# Patient Record
Sex: Female | Born: 1950
Health system: Southern US, Community
[De-identification: ages and names within clinical notes are randomized; demographics above are authoritative.]

## PROBLEM LIST (undated history)

## (undated) DIAGNOSIS — F419 Anxiety disorder, unspecified: Secondary | ICD-10-CM

## (undated) DIAGNOSIS — J189 Pneumonia, unspecified organism: Secondary | ICD-10-CM

## (undated) DIAGNOSIS — G4700 Insomnia, unspecified: Secondary | ICD-10-CM

## (undated) DIAGNOSIS — F32A Depression, unspecified: Secondary | ICD-10-CM

## (undated) DIAGNOSIS — I10 Essential (primary) hypertension: Secondary | ICD-10-CM

## (undated) DIAGNOSIS — Z9889 Other specified postprocedural states: Secondary | ICD-10-CM

## (undated) DIAGNOSIS — F329 Major depressive disorder, single episode, unspecified: Secondary | ICD-10-CM

## (undated) DIAGNOSIS — M549 Dorsalgia, unspecified: Secondary | ICD-10-CM

## (undated) DIAGNOSIS — K5792 Diverticulitis of intestine, part unspecified, without perforation or abscess without bleeding: Secondary | ICD-10-CM

## (undated) DIAGNOSIS — G43909 Migraine, unspecified, not intractable, without status migrainosus: Secondary | ICD-10-CM

## (undated) HISTORY — PX: ABDOMINAL HYSTERECTOMY: SHX81

## (undated) HISTORY — PX: TOE SURGERY: SHX1073

## (undated) HISTORY — PX: OTHER SURGICAL HISTORY: SHX169

## (undated) HISTORY — PX: BACK SURGERY: SHX140

## (undated) HISTORY — PX: SINUS ENDO WITH FUSION: SHX5329

---

## 1998-12-03 ENCOUNTER — Emergency Department (HOSPITAL_COMMUNITY): Admission: EM | Admit: 1998-12-03 | Discharge: 1998-12-03 | Payer: Self-pay | Admitting: *Deleted

## 1998-12-03 ENCOUNTER — Encounter: Payer: Self-pay | Admitting: *Deleted

## 2001-03-07 ENCOUNTER — Encounter: Payer: Self-pay | Admitting: Unknown Physician Specialty

## 2001-03-07 ENCOUNTER — Ambulatory Visit (HOSPITAL_COMMUNITY): Admission: RE | Admit: 2001-03-07 | Discharge: 2001-03-07 | Payer: Self-pay | Admitting: Unknown Physician Specialty

## 2001-03-31 ENCOUNTER — Ambulatory Visit (HOSPITAL_COMMUNITY): Admission: RE | Admit: 2001-03-31 | Discharge: 2001-03-31 | Payer: Self-pay | Admitting: Unknown Physician Specialty

## 2001-03-31 ENCOUNTER — Encounter: Payer: Self-pay | Admitting: Unknown Physician Specialty

## 2004-08-03 ENCOUNTER — Emergency Department (HOSPITAL_COMMUNITY): Admission: EM | Admit: 2004-08-03 | Discharge: 2004-08-03 | Payer: Self-pay | Admitting: Emergency Medicine

## 2004-11-07 ENCOUNTER — Emergency Department (HOSPITAL_COMMUNITY): Admission: EM | Admit: 2004-11-07 | Discharge: 2004-11-07 | Payer: Self-pay | Admitting: Emergency Medicine

## 2005-12-21 ENCOUNTER — Emergency Department (HOSPITAL_COMMUNITY): Admission: EM | Admit: 2005-12-21 | Discharge: 2005-12-21 | Payer: Self-pay | Admitting: Emergency Medicine

## 2006-07-12 ENCOUNTER — Ambulatory Visit (HOSPITAL_COMMUNITY): Admission: RE | Admit: 2006-07-12 | Discharge: 2006-07-12 | Payer: Self-pay | Admitting: Family Medicine

## 2006-09-17 ENCOUNTER — Emergency Department (HOSPITAL_COMMUNITY): Admission: EM | Admit: 2006-09-17 | Discharge: 2006-09-17 | Payer: Self-pay | Admitting: Emergency Medicine

## 2006-09-19 ENCOUNTER — Ambulatory Visit: Payer: Self-pay | Admitting: Orthopedic Surgery

## 2006-09-29 ENCOUNTER — Ambulatory Visit: Payer: Self-pay | Admitting: Orthopedic Surgery

## 2006-11-03 ENCOUNTER — Ambulatory Visit: Payer: Self-pay | Admitting: Orthopedic Surgery

## 2006-12-19 ENCOUNTER — Ambulatory Visit: Payer: Self-pay | Admitting: Orthopedic Surgery

## 2007-09-15 ENCOUNTER — Ambulatory Visit (HOSPITAL_COMMUNITY): Admission: RE | Admit: 2007-09-15 | Discharge: 2007-09-15 | Payer: Self-pay | Admitting: Family Medicine

## 2009-12-01 ENCOUNTER — Emergency Department (HOSPITAL_COMMUNITY)
Admission: EM | Admit: 2009-12-01 | Discharge: 2009-12-01 | Payer: Self-pay | Source: Home / Self Care | Admitting: Emergency Medicine

## 2010-06-12 ENCOUNTER — Ambulatory Visit (HOSPITAL_COMMUNITY)
Admission: RE | Admit: 2010-06-12 | Discharge: 2010-06-12 | Payer: Self-pay | Source: Home / Self Care | Attending: Family Medicine | Admitting: Family Medicine

## 2010-06-14 ENCOUNTER — Encounter: Payer: Self-pay | Admitting: Family Medicine

## 2010-08-09 LAB — URINALYSIS, ROUTINE W REFLEX MICROSCOPIC: Nitrite: NEGATIVE

## 2010-08-09 LAB — URINE MICROSCOPIC-ADD ON

## 2010-10-23 ENCOUNTER — Emergency Department (HOSPITAL_COMMUNITY)
Admission: EM | Admit: 2010-10-23 | Discharge: 2010-10-23 | Disposition: A | Discharge status: Home / Self Care | Payer: Self-pay | Attending: Emergency Medicine | Admitting: Emergency Medicine

## 2010-10-23 DIAGNOSIS — G43909 Migraine, unspecified, not intractable, without status migrainosus: Secondary | ICD-10-CM | POA: Insufficient documentation

## 2010-10-23 DIAGNOSIS — F17200 Nicotine dependence, unspecified, uncomplicated: Secondary | ICD-10-CM | POA: Insufficient documentation

## 2011-04-14 ENCOUNTER — Other Ambulatory Visit: Payer: Self-pay

## 2011-04-14 ENCOUNTER — Encounter: Payer: Self-pay | Admitting: Emergency Medicine

## 2011-04-14 ENCOUNTER — Emergency Department (HOSPITAL_COMMUNITY)
Admission: EM | Admit: 2011-04-14 | Discharge: 2011-04-14 | Disposition: A | Discharge status: Home / Self Care | Payer: Self-pay | Attending: Emergency Medicine | Admitting: Emergency Medicine

## 2011-04-14 DIAGNOSIS — N390 Urinary tract infection, site not specified: Secondary | ICD-10-CM | POA: Insufficient documentation

## 2011-04-14 DIAGNOSIS — R209 Unspecified disturbances of skin sensation: Secondary | ICD-10-CM | POA: Insufficient documentation

## 2011-04-14 DIAGNOSIS — M79609 Pain in unspecified limb: Secondary | ICD-10-CM | POA: Insufficient documentation

## 2011-04-14 DIAGNOSIS — R0602 Shortness of breath: Secondary | ICD-10-CM | POA: Insufficient documentation

## 2011-04-14 DIAGNOSIS — R5383 Other fatigue: Secondary | ICD-10-CM | POA: Insufficient documentation

## 2011-04-14 DIAGNOSIS — H5702 Anisocoria: Secondary | ICD-10-CM | POA: Insufficient documentation

## 2011-04-14 DIAGNOSIS — M549 Dorsalgia, unspecified: Secondary | ICD-10-CM | POA: Insufficient documentation

## 2011-04-14 DIAGNOSIS — R5381 Other malaise: Secondary | ICD-10-CM | POA: Insufficient documentation

## 2011-04-14 DIAGNOSIS — R197 Diarrhea, unspecified: Secondary | ICD-10-CM | POA: Insufficient documentation

## 2011-04-14 DIAGNOSIS — R112 Nausea with vomiting, unspecified: Secondary | ICD-10-CM | POA: Insufficient documentation

## 2011-04-14 DIAGNOSIS — M25519 Pain in unspecified shoulder: Secondary | ICD-10-CM | POA: Insufficient documentation

## 2011-04-14 HISTORY — DX: Migraine, unspecified, not intractable, without status migrainosus: G43.909

## 2011-04-14 HISTORY — DX: Dorsalgia, unspecified: M54.9

## 2011-04-14 HISTORY — DX: Depression, unspecified: F32.A

## 2011-04-14 HISTORY — DX: Insomnia, unspecified: G47.00

## 2011-04-14 HISTORY — DX: Diverticulitis of intestine, part unspecified, without perforation or abscess without bleeding: K57.92

## 2011-04-14 HISTORY — DX: Anxiety disorder, unspecified: F41.9

## 2011-04-14 HISTORY — DX: Major depressive disorder, single episode, unspecified: F32.9

## 2011-04-14 LAB — URINALYSIS, ROUTINE W REFLEX MICROSCOPIC
Bilirubin Urine: NEGATIVE
Glucose, UA: NEGATIVE mg/dL
Ketones, ur: NEGATIVE mg/dL
Nitrite: NEGATIVE
Protein, ur: NEGATIVE mg/dL
Specific Gravity, Urine: 1.015 (ref 1.005–1.030)
Urobilinogen, UA: 0.2 mg/dL (ref 0.0–1.0)
pH: 5.5 (ref 5.0–8.0)

## 2011-04-14 LAB — POCT I-STAT, CHEM 8
BUN: 13 mg/dL (ref 6–23)
Calcium, Ion: 1.17 mmol/L (ref 1.12–1.32)
Chloride: 109 mEq/L (ref 96–112)
Creatinine, Ser: 0.8 mg/dL (ref 0.50–1.10)
Glucose, Bld: 96 mg/dL (ref 70–99)
HCT: 41 % (ref 36.0–46.0)
Hemoglobin: 13.9 g/dL (ref 12.0–15.0)
Potassium: 4 mEq/L (ref 3.5–5.1)
Sodium: 140 mEq/L (ref 135–145)
TCO2: 20 mmol/L (ref 0–100)

## 2011-04-14 LAB — URINE MICROSCOPIC-ADD ON

## 2011-04-14 MED ORDER — DIAZEPAM 5 MG/ML IJ SOLN
5.0000 mg | Freq: Once | INTRAMUSCULAR | Status: AC
  Administered 2011-04-14: 5 mg via INTRAVENOUS
  Filled 2011-04-14: qty 2

## 2011-04-14 MED ORDER — DEXTROSE 5 % IV SOLN
1.0000 g | INTRAVENOUS | Status: DC
  Administered 2011-04-14: 1 g via INTRAVENOUS
  Filled 2011-04-14: qty 10

## 2011-04-14 MED ORDER — HYDROMORPHONE HCL PF 1 MG/ML IJ SOLN
1.0000 mg | Freq: Once | INTRAMUSCULAR | Status: AC
  Administered 2011-04-14: 1 mg via INTRAVENOUS
  Filled 2011-04-14: qty 1

## 2011-04-14 MED ORDER — NITROFURANTOIN MONOHYD MACRO 100 MG PO CAPS: 100.0000 mg | ORAL_CAPSULE | Freq: Two times a day (BID) | ORAL | Status: AC

## 2011-04-14 MED ORDER — SODIUM CHLORIDE 0.9 % IV BOLUS (SEPSIS)
500.0000 mL | Freq: Once | INTRAVENOUS | Status: AC
  Administered 2011-04-14: 500 mL via INTRAVENOUS

## 2011-04-14 MED ORDER — ONDANSETRON HCL 4 MG/2ML IJ SOLN
4.0000 mg | Freq: Once | INTRAMUSCULAR | Status: AC
  Administered 2011-04-14: 4 mg via INTRAVENOUS
  Filled 2011-04-14: qty 2

## 2011-04-14 NOTE — ED Provider Notes (Addendum)
History  Scribed for [name], MD, the patient was seen in room APA09. This chart was scribed by Hillery Hunter.   CSN: [id number] Arrival date & time: 04/14/2011  3:04 PM   First MD Initiated Contact with Patient 04/14/11 1506      Chief Complaint  Patient presents with  . Shoulder Pain  . Back Pain  . Leg Pain  . Hematuria  . Dysuria    The history is provided by the patient.    Wendy Arnold is a 60 y.o. female who presents to the Emergency Department complaining of four months of constant "throbbing" muskuloskeletal pain in hips, legs, shoulders and lower back. She describes this pain as gradually worsening over that time and improved transiently with warm and cold compresses, Vicodin, Ibuprofen and Tylenol. She complains also of hematuria, fever, nausea, vomiting, mild diarrhea occasionally, shortness of breath two weeks ago, but states her primary complaint is "pain all over." She reports previous back surgery in 1996.      Past Medical History  Diagnosis Date  . Anxiety   . Insomnia   . Depression   . Back pain   . Migraines   . Diverticulitis     Past Surgical History  Procedure Date  . Back surgery   . Sinus endo with fusion   . Abdominal hysterectomy   . Toe surgery     Family History  Problem Relation Age of Onset  . Heart failure Mother   . Cancer Father   . Cancer Brother   . Cancer Brother   . Cancer Brother     History  Substance Use Topics  . Smoking status: Current Everyday Smoker -- 0.5 packs/day for 40 years    Types: Cigarettes  . Smokeless tobacco: Never Used  . Alcohol Use: Yes     rarely  confirms occasional alcohol use  OB History    Grav Para Term Preterm Abortions TAB SAB Ect Mult Living   5 1  1 4     1       Review of Systems  Constitutional: Positive for fever.  Respiratory: Positive for shortness of breath.   Gastrointestinal: Positive for nausea, vomiting and diarrhea.  Genitourinary: Positive for  dysuria and hematuria (one week ago, improved, then a few clots in urine recently).  Musculoskeletal: Positive for back pain.  Skin: Negative for wound.  Neurological:       Left arm tingling intermittently  Psychiatric/Behavioral: Negative for confusion.    Allergies  Review of patient's allergies indicates no known allergies.  Home Medications   Current Outpatient Rx  Name Route Sig Dispense Refill  . ACETAMINOPHEN 325 MG PO TABS Oral Take 1,300 mg by mouth every 6 (six) hours as needed. For pain. **Alternating with Ibuprofen 200-800mg  every 2 to 3 hours as needed for pain**     . ALPRAZOLAM 1 MG PO TABS Oral Take 1 mg by mouth 4 (four) times daily.      . IBUPROFEN 200 MG PO TABS Oral Take 200-400 mg by mouth every 3 (three) hours as needed. For pain. **Alternating with Tylenol 325 up to every 2 to 3 hours as needed for pain**     . BIOFREEZE EX Apply externally Apply 1 application topically as needed. For pain     . TRAZODONE HCL 150 MG PO TABS Oral Take 150 mg by mouth at bedtime.        BP 146/82  Pulse 86  Temp(Src) 98.4 F (36.9 C) (  Oral)  Resp 20  Ht 5\' 7"  (1.702 m)  Wt 190 lb (86.183 kg)  BMI 29.76 kg/m2  SpO2 98%  Physical Exam  Nursing note and vitals reviewed. Constitutional: She is oriented to person, place, and time. She appears well-nourished. No distress.  HENT:  Head: Normocephalic and atraumatic.  Eyes: Conjunctivae are normal.       Anisocoria R (4mm) > L (2-83mm), both reactive to light  Neck: Neck supple.  Cardiovascular: Normal rate, regular rhythm and normal heart sounds.   Pulmonary/Chest: Effort normal. No stridor. No respiratory distress. She has no wheezes. She has no rales.  Abdominal: Soft. She exhibits no distension. There is no tenderness. There is no rebound and no guarding.       No CVA ttp  Musculoskeletal: She exhibits no edema and no tenderness.  Neurological: She is alert and oriented to person, place, and time.  Skin: Skin is warm  and dry. No rash noted.  Psychiatric:       Histrionic affect, anxious appearing    ED Course  Procedures    Labs Reviewed  I-STAT, CHEM 8  URINALYSIS, ROUTINE W REFLEX MICROSCOPIC   No results found.   OTHER DATA REVIEWED: Nursing notes, vital signs, and past medical records reviewed.   DIAGNOSTIC STUDIES: Oxygen Saturation is 98% on room air, normal by my interpretation.     ED COURSE / COORDINATION OF CARE: 15:35. Ordered: I-Stat, Chem 8 ; Urinalysis with microscopic ; sodium chloride 0.9 % bolus 500 mL ; HYDROmorphone (DILAUDID) injection 1 mg ; diazepam (VALIUM) injection 5 mg ; ondansetron (ZOFRAN) injection 4 mg ; ED EKG    4:08 PM 60yf with multiple complaints. Pt very anxious and histrionic and difficult to obtain focused hx and differentiate chronic from acute complaints. Diffuse pain of unclear etiology. No hx of acute trauma. S/p traumatic injuries in past and suspect some component of PTSD. Pain in back chronic in nature. No neuro complaints. No focal tenderness or suspicious skin lesions. Feel imaging very low yield at this time and unlikely to find etiology.  Nonfocal neuro exam aside from anisicoria. Possible UTI with hematuria and dysuria. Will check urine and basic labs to asses renal function. Symptoms somewhat atypical for renal colic with constant nature and duration for fever months. IVF, meds and reassessment.  EKG:  Rhythm: normal sinus Rate: 89 Axis: normal Intervals:normal ST segments: NS ST changes. Some flattening  anteriorly and aVL   MDM  60yf with multiple complaints. Given UA and symptoms will tx for UTI. Clinically not pyelo. Afebrile and HD stable. Renal function fine. Pt anxious but not toxic. Feel that can appropriately follow-up as outpt. Tylenol/nsaids prn pain.      I personally preformed the services scribed in my presence. The recorded information has been reviewed and considered. [name], MD.  5:22 PM Called to speak  with pt again on request of nursing. Pt requesting prescription for pain medication "to get me through until I can see someone." Discussed with pt that a lot of her pain complaints are chronic in nature and that emergency room is not the appropriate place for management of chronic pain. Pt has seen pain management in past so encouraged to follow-up with them. Pt encouraged to be advocate for self and need to be more proactive in seeking outpt follow-up. Pt received pain meds in ED. Has vicodin at home. Given script for abx for uti. Encouraged to take nsaids prn.   [name], MD 04/14/11  1650  [name], MD 04/14/11 [phone]

## 2011-04-14 NOTE — ED Notes (Signed)
Patient c/o pain in shoulders, back, hip, and legs. Patient also c/o blood in urine and pain with urine.

## 2011-04-14 NOTE — ED Notes (Signed)
Left in c/o family for transport home; pt instructed to fill abx Rx and take until completed.

## 2011-04-14 NOTE — ED Notes (Signed)
C/o pain from "bottom of my feet up to my neck" since July of this year. Reports pain is throbbing.

## 2011-04-14 NOTE — ED Notes (Signed)
Resting in no distress; reports bilateral lower extremity pain 2/10; denies relief of pain in hips, back or neck-states 9-10/10.

## 2011-04-14 NOTE — Discharge Instructions (Signed)
Urinary Tract Infection Infections of the urinary tract can start in several places. A bladder infection (cystitis), a kidney infection (pyelonephritis), and a prostate infection (prostatitis) are different types of urinary tract infections (UTIs). They usually get better if treated with medicines (antibiotics) that kill germs. Take all the medicine until it is gone. You or your child may feel better in a few days, but TAKE ALL MEDICINE or the infection may not respond and may become more difficult to treat. HOME CARE INSTRUCTIONS   Drink enough water and fluids to keep the urine clear or pale yellow. Cranberry juice is especially recommended, in addition to large amounts of water.   Avoid caffeine, tea, and carbonated beverages. They tend to irritate the bladder.   Alcohol may irritate the prostate.   Only take over-the-counter or prescription medicines for pain, discomfort, or fever as directed by your caregiver.  To prevent further infections:  Empty the bladder often. Avoid holding urine for long periods of time.   After a bowel movement, women should cleanse from front to back. Use each tissue only once.   Empty the bladder before and after sexual intercourse.  FINDING OUT THE RESULTS OF YOUR TEST Not all test results are available during your visit. If your or your child's test results are not back during the visit, make an appointment with your caregiver to find out the results. Do not assume everything is normal if you have not heard from your caregiver or the medical facility. It is important for you to follow up on all test results. SEEK MEDICAL CARE IF:   There is back pain.   Your baby is older than 3 months with a rectal temperature of 100.5 F (38.1 C) or higher for more than 1 day.   Your or your child's problems (symptoms) are no better in 3 days. Return sooner if you or your child is getting worse.  SEEK IMMEDIATE MEDICAL CARE IF:   There is severe back pain or lower  abdominal pain.   You or your child develops chills.   You have a fever.   Your baby is older than 3 months with a rectal temperature of 102 F (38.9 C) or higher.   Your baby is 71 months old or younger with a rectal temperature of 100.4 F (38 C) or higher.   There is nausea or vomiting.   There is continued burning or discomfort with urination.  MAKE SURE YOU:   Understand these instructions.   Will watch your condition.   Will get help right away if you are not doing well or get worse.  Document Released: 02/17/2005 Document Revised: 01/20/2011 Document Reviewed: 09/22/2006 [facility] Patient Information 2012 [place], [place].Weakness, Generalized Without Cause Your caregiver has seen you today because you are having problems with feelings of weakness. Weakness has many different causes, some of which are common and others are very rare. The causes of weakness are so numerous they could not all be listed on this page. The exam and other tests done today do not reveal a specific cause for the weakness that is an immediate danger or something that is treatable. Your caregiver has checked you for the most common causes of weakness and feels it is safe for you to go home and be observed. HOME CARE INSTRUCTIONS   For the time being, obtain more rest if needed.   Eat a well balanced diet.   Try to get at least some exercise every day in spite of how  difficult it may seem at times. In the case of the elderly, exercise is especially important. As we grow older, there is a loss of muscle mass. Generally, there is also a loss of, or decrease in, activity that comes naturally with the aging process. Exercise and increased activities are the only tools we have to combat this natural process.   The results of some tests ordered today may not be available right away. You will be contacted with those results when they become available.   It is important to follow through with your physician as  per instructions that you may have received today.  SEEK MEDICAL CARE IF:   You have any new concerns which you do not feel were dealt with today.   The weakness seems to be getting progressively worse.   You develop new or unusual aches or pains.  SEEK IMMEDIATE MEDICAL CARE IF:   You are unable to tend to your usual daily activities such as simply getting dressed, feeding yourself, or keeping up with your personal hygiene.   You develop inability to walk stairs or perform your usual daily activities.   You develop shortness of breath, chest pain, have difficulty moving parts of your body, or develop new problems for which you have not talked to your caregiver.   You experience difficulty speaking or swallowing.   You develop loss of control of bladder or bowels that was not present before.  Document Released: 05/10/2005 Document Revised: 01/20/2011 Document Reviewed: 10/20/2006 [facility] Patient Information 2012 [place], [place].

## 2011-07-02 ENCOUNTER — Ambulatory Visit (HOSPITAL_COMMUNITY)
Admission: RE | Admit: 2011-07-02 | Discharge: 2011-07-02 | Disposition: A | Discharge status: Home / Self Care | Payer: Self-pay | Source: Ambulatory Visit | Attending: Family Medicine | Admitting: Family Medicine

## 2011-07-02 ENCOUNTER — Other Ambulatory Visit (HOSPITAL_COMMUNITY): Payer: Self-pay | Admitting: Family Medicine

## 2011-07-02 DIAGNOSIS — F17200 Nicotine dependence, unspecified, uncomplicated: Secondary | ICD-10-CM

## 2011-07-02 DIAGNOSIS — R059 Cough, unspecified: Secondary | ICD-10-CM

## 2011-07-02 DIAGNOSIS — M5137 Other intervertebral disc degeneration, lumbosacral region: Secondary | ICD-10-CM | POA: Insufficient documentation

## 2011-07-02 DIAGNOSIS — M549 Dorsalgia, unspecified: Secondary | ICD-10-CM

## 2011-07-02 DIAGNOSIS — M542 Cervicalgia: Secondary | ICD-10-CM | POA: Insufficient documentation

## 2011-07-02 DIAGNOSIS — Z139 Encounter for screening, unspecified: Secondary | ICD-10-CM

## 2011-07-02 DIAGNOSIS — M51379 Other intervertebral disc degeneration, lumbosacral region without mention of lumbar back pain or lower extremity pain: Secondary | ICD-10-CM | POA: Insufficient documentation

## 2011-07-02 DIAGNOSIS — R05 Cough: Secondary | ICD-10-CM

## 2011-07-02 DIAGNOSIS — M545 Low back pain, unspecified: Secondary | ICD-10-CM | POA: Insufficient documentation

## 2011-07-02 DIAGNOSIS — M546 Pain in thoracic spine: Secondary | ICD-10-CM | POA: Insufficient documentation

## 2011-07-02 DIAGNOSIS — M5380 Other specified dorsopathies, site unspecified: Secondary | ICD-10-CM | POA: Insufficient documentation

## 2011-07-08 ENCOUNTER — Ambulatory Visit (HOSPITAL_COMMUNITY)
Admission: RE | Admit: 2011-07-08 | Discharge: 2011-07-08 | Disposition: A | Discharge status: Home / Self Care | Payer: Self-pay | Source: Ambulatory Visit | Attending: Family Medicine | Admitting: Family Medicine

## 2011-07-08 DIAGNOSIS — Z139 Encounter for screening, unspecified: Secondary | ICD-10-CM

## 2011-07-08 DIAGNOSIS — Z13820 Encounter for screening for osteoporosis: Secondary | ICD-10-CM | POA: Insufficient documentation

## 2011-07-08 DIAGNOSIS — Z780 Asymptomatic menopausal state: Secondary | ICD-10-CM | POA: Insufficient documentation

## 2011-07-23 ENCOUNTER — Emergency Department (HOSPITAL_COMMUNITY): Payer: Self-pay

## 2011-07-23 ENCOUNTER — Inpatient Hospital Stay (HOSPITAL_COMMUNITY)
Admission: EM | Admit: 2011-07-23 | Discharge: 2011-07-27 | DRG: 690 | Disposition: A | Discharge status: Home / Self Care | Payer: Self-pay | Attending: Internal Medicine | Admitting: Internal Medicine

## 2011-07-23 ENCOUNTER — Encounter (HOSPITAL_COMMUNITY): Payer: Self-pay | Admitting: *Deleted

## 2011-07-23 DIAGNOSIS — J438 Other emphysema: Secondary | ICD-10-CM | POA: Diagnosis present

## 2011-07-23 DIAGNOSIS — N10 Acute tubulo-interstitial nephritis: Principal | ICD-10-CM | POA: Diagnosis present

## 2011-07-23 DIAGNOSIS — D649 Anemia, unspecified: Secondary | ICD-10-CM | POA: Diagnosis present

## 2011-07-23 DIAGNOSIS — G43909 Migraine, unspecified, not intractable, without status migrainosus: Secondary | ICD-10-CM | POA: Diagnosis present

## 2011-07-23 DIAGNOSIS — G8929 Other chronic pain: Secondary | ICD-10-CM | POA: Diagnosis present

## 2011-07-23 DIAGNOSIS — Z148 Genetic carrier of other disease: Secondary | ICD-10-CM

## 2011-07-23 DIAGNOSIS — E860 Dehydration: Secondary | ICD-10-CM | POA: Diagnosis present

## 2011-07-23 DIAGNOSIS — R042 Hemoptysis: Secondary | ICD-10-CM | POA: Diagnosis present

## 2011-07-23 DIAGNOSIS — Z79899 Other long term (current) drug therapy: Secondary | ICD-10-CM

## 2011-07-23 DIAGNOSIS — F411 Generalized anxiety disorder: Secondary | ICD-10-CM | POA: Diagnosis present

## 2011-07-23 DIAGNOSIS — F3289 Other specified depressive episodes: Secondary | ICD-10-CM | POA: Diagnosis present

## 2011-07-23 DIAGNOSIS — N12 Tubulo-interstitial nephritis, not specified as acute or chronic: Secondary | ICD-10-CM

## 2011-07-23 DIAGNOSIS — G4700 Insomnia, unspecified: Secondary | ICD-10-CM | POA: Diagnosis present

## 2011-07-23 DIAGNOSIS — Z7982 Long term (current) use of aspirin: Secondary | ICD-10-CM

## 2011-07-23 DIAGNOSIS — F17200 Nicotine dependence, unspecified, uncomplicated: Secondary | ICD-10-CM | POA: Diagnosis present

## 2011-07-23 DIAGNOSIS — R112 Nausea with vomiting, unspecified: Secondary | ICD-10-CM | POA: Diagnosis present

## 2011-07-23 DIAGNOSIS — K5730 Diverticulosis of large intestine without perforation or abscess without bleeding: Secondary | ICD-10-CM | POA: Diagnosis present

## 2011-07-23 DIAGNOSIS — Z224 Carrier of infections with a predominantly sexual mode of transmission: Secondary | ICD-10-CM

## 2011-07-23 DIAGNOSIS — F329 Major depressive disorder, single episode, unspecified: Secondary | ICD-10-CM | POA: Diagnosis present

## 2011-07-23 DIAGNOSIS — N390 Urinary tract infection, site not specified: Secondary | ICD-10-CM | POA: Diagnosis present

## 2011-07-23 DIAGNOSIS — J189 Pneumonia, unspecified organism: Secondary | ICD-10-CM | POA: Diagnosis present

## 2011-07-23 LAB — URINE MICROSCOPIC-ADD ON

## 2011-07-23 LAB — URINALYSIS, ROUTINE W REFLEX MICROSCOPIC
Glucose, UA: NEGATIVE mg/dL
Ketones, ur: NEGATIVE mg/dL
pH: 6 (ref 5.0–8.0)

## 2011-07-23 MED ORDER — ACETAMINOPHEN 500 MG PO TABS
ORAL_TABLET | ORAL | Status: AC
  Filled 2011-07-23: qty 2

## 2011-07-23 MED ORDER — SODIUM CHLORIDE 0.9 % IV BOLUS (SEPSIS)
1000.0000 mL | Freq: Once | INTRAVENOUS | Status: AC
  Administered 2011-07-23: 1000 mL via INTRAVENOUS

## 2011-07-23 MED ORDER — ACETAMINOPHEN 500 MG PO TABS
1000.0000 mg | ORAL_TABLET | Freq: Once | ORAL | Status: AC
  Administered 2011-07-23: 1000 mg via ORAL

## 2011-07-23 MED ORDER — ONDANSETRON HCL 4 MG/2ML IJ SOLN
4.0000 mg | Freq: Once | INTRAMUSCULAR | Status: AC
  Administered 2011-07-23: 4 mg via INTRAVENOUS
  Filled 2011-07-23: qty 2

## 2011-07-23 MED ORDER — SODIUM CHLORIDE 0.9 % IV SOLN
INTRAVENOUS | Status: DC
  Administered 2011-07-24 (×2): via INTRAVENOUS

## 2011-07-23 NOTE — ED Notes (Signed)
Pt tearful, seems to be agitated

## 2011-07-23 NOTE — ED Notes (Signed)
Pt c/o n/v x 2 months. States has been seen at multiple places with no relief. Pt blew nose and there was blood on the tissue.

## 2011-07-23 NOTE — ED Notes (Signed)
No needs voiced at this tme.

## 2011-07-24 ENCOUNTER — Inpatient Hospital Stay (HOSPITAL_COMMUNITY): Payer: Self-pay

## 2011-07-24 DIAGNOSIS — N390 Urinary tract infection, site not specified: Secondary | ICD-10-CM | POA: Diagnosis present

## 2011-07-24 DIAGNOSIS — J189 Pneumonia, unspecified organism: Secondary | ICD-10-CM | POA: Diagnosis present

## 2011-07-24 DIAGNOSIS — R042 Hemoptysis: Secondary | ICD-10-CM | POA: Diagnosis present

## 2011-07-24 DIAGNOSIS — Z148 Genetic carrier of other disease: Secondary | ICD-10-CM

## 2011-07-24 LAB — EXPECTORATED SPUTUM ASSESSMENT W GRAM STAIN, RFLX TO RESP C

## 2011-07-24 LAB — TSH: TSH: 2.299 u[IU]/mL (ref 0.350–4.500)

## 2011-07-24 LAB — CBC
HCT: 30.9 % — ABNORMAL LOW (ref 36.0–46.0)
Hemoglobin: 10.2 g/dL — ABNORMAL LOW (ref 12.0–15.0)
MCH: 31.6 pg (ref 26.0–34.0)
MCHC: 33 g/dL (ref 30.0–36.0)
MCHC: 33.6 g/dL (ref 30.0–36.0)
MCV: 95.7 fL (ref 78.0–100.0)
Platelets: 338 K/uL (ref 150–400)
Platelets: 347 10*3/uL (ref 150–400)
RBC: 3.23 MIL/uL — ABNORMAL LOW (ref 3.87–5.11)
RDW: 12.8 % (ref 11.5–15.5)
RDW: 12.8 % (ref 11.5–15.5)
WBC: 11.3 K/uL — ABNORMAL HIGH (ref 4.0–10.5)
WBC: 12.9 10*3/uL — ABNORMAL HIGH (ref 4.0–10.5)

## 2011-07-24 LAB — MRSA PCR SCREENING: MRSA by PCR: NEGATIVE

## 2011-07-24 LAB — COMPREHENSIVE METABOLIC PANEL
ALT: 28 U/L (ref 0–35)
AST: 21 U/L (ref 0–37)
Calcium: 8.5 mg/dL (ref 8.4–10.5)
Sodium: 138 mEq/L (ref 135–145)
Total Protein: 6.5 g/dL (ref 6.0–8.3)

## 2011-07-24 LAB — PRO B NATRIURETIC PEPTIDE: Pro B Natriuretic peptide (BNP): 876.5 pg/mL — ABNORMAL HIGH (ref 0–125)

## 2011-07-24 LAB — DIFFERENTIAL
Basophils Absolute: 0 10*3/uL (ref 0.0–0.1)
Basophils Relative: 0 % (ref 0–1)
Neutro Abs: 9.2 10*3/uL — ABNORMAL HIGH (ref 1.7–7.7)
Neutrophils Relative %: 72 % (ref 43–77)

## 2011-07-24 LAB — BASIC METABOLIC PANEL
Chloride: 101 mEq/L (ref 96–112)
Creatinine, Ser: 0.77 mg/dL (ref 0.50–1.10)
GFR calc Af Amer: >90 mL/min (ref >90)
Potassium: 4.1 mEq/L (ref 3.5–5.1)
Sodium: 135 mEq/L (ref 135–145)

## 2011-07-24 LAB — VITAMIN B12: Vitamin B-12: 447 pg/mL (ref 211–911)

## 2011-07-24 LAB — PHOSPHORUS: Phosphorus: 4.3 mg/dL (ref 2.3–4.6)

## 2011-07-24 LAB — PROTIME-INR: INR: 1.18 (ref 0.00–1.49)

## 2011-07-24 LAB — HEPATIC FUNCTION PANEL
Albumin: 3 g/dL — ABNORMAL LOW (ref 3.5–5.2)
Total Bilirubin: 0.3 mg/dL (ref 0.3–1.2)
Total Protein: 6.9 g/dL (ref 6.0–8.3)

## 2011-07-24 LAB — APTT: aPTT: 38 seconds — ABNORMAL HIGH (ref 24–37)

## 2011-07-24 LAB — PROCALCITONIN: Procalcitonin: <0.1 ng/mL

## 2011-07-24 LAB — RHEUMATOID FACTOR: Rhuematoid fact SerPl-aCnc: <10 IU/mL (ref <=14)

## 2011-07-24 LAB — MAGNESIUM: Magnesium: 1.8 mg/dL (ref 1.5–2.5)

## 2011-07-24 LAB — LACTIC ACID, PLASMA: Lactic Acid, Venous: 1.1 mmol/L (ref 0.5–2.2)

## 2011-07-24 MED ORDER — PIPERACILLIN-TAZOBACTAM 3.375 G IVPB
INTRAVENOUS | Status: AC
  Filled 2011-07-24: qty 50

## 2011-07-24 MED ORDER — HYDROCODONE-ACETAMINOPHEN 5-325 MG PO TABS
1.0000 | ORAL_TABLET | Freq: Four times a day (QID) | ORAL | Status: DC | PRN
  Administered 2011-07-24 – 2011-07-27 (×13): 1 via ORAL
  Filled 2011-07-24 (×14): qty 1

## 2011-07-24 MED ORDER — ALBUTEROL SULFATE (5 MG/ML) 0.5% IN NEBU
2.5000 mg | INHALATION_SOLUTION | Freq: Once | RESPIRATORY_TRACT | Status: AC
  Administered 2011-07-24: 2.5 mg via RESPIRATORY_TRACT
  Filled 2011-07-24: qty 0.5

## 2011-07-24 MED ORDER — VANCOMYCIN HCL IN DEXTROSE 1-5 GM/200ML-% IV SOLN
1000.0000 mg | Freq: Once | INTRAVENOUS | Status: AC
  Administered 2011-07-24: 1000 mg via INTRAVENOUS
  Filled 2011-07-24: qty 200

## 2011-07-24 MED ORDER — TUBERCULIN PPD 5 UNIT/0.1ML ID SOLN
5.0000 [IU] | Freq: Once | INTRADERMAL | Status: AC
  Administered 2011-07-24: 5 [IU] via INTRADERMAL
  Filled 2011-07-24: qty 0.1

## 2011-07-24 MED ORDER — DEXTROSE 5 % IV SOLN
1.0000 g | Freq: Once | INTRAVENOUS | Status: AC
  Administered 2011-07-24: 1 g via INTRAVENOUS
  Filled 2011-07-24: qty 10

## 2011-07-24 MED ORDER — VANCOMYCIN HCL IN DEXTROSE 1-5 GM/200ML-% IV SOLN
1000.0000 mg | Freq: Two times a day (BID) | INTRAVENOUS | Status: DC
  Administered 2011-07-24 – 2011-07-26 (×4): 1000 mg via INTRAVENOUS
  Filled 2011-07-24 (×4): qty 200

## 2011-07-24 MED ORDER — ALBUTEROL SULFATE HFA 108 (90 BASE) MCG/ACT IN AERS
2.0000 | INHALATION_SPRAY | Freq: Four times a day (QID) | RESPIRATORY_TRACT | Status: DC
  Administered 2011-07-24 – 2011-07-27 (×12): 2 via RESPIRATORY_TRACT
  Filled 2011-07-24: qty 6.7

## 2011-07-24 MED ORDER — VANCOMYCIN HCL IN DEXTROSE 1-5 GM/200ML-% IV SOLN
INTRAVENOUS | Status: AC
  Filled 2011-07-24: qty 200

## 2011-07-24 MED ORDER — ALPRAZOLAM 1 MG PO TABS
1.0000 mg | ORAL_TABLET | Freq: Four times a day (QID) | ORAL | Status: DC
  Administered 2011-07-24 – 2011-07-27 (×14): 1 mg via ORAL
  Filled 2011-07-24 (×6): qty 1
  Filled 2011-07-24 (×2): qty 2
  Filled 2011-07-24 (×7): qty 1

## 2011-07-24 MED ORDER — LEVOFLOXACIN IN D5W 750 MG/150ML IV SOLN
INTRAVENOUS | Status: AC
  Filled 2011-07-24: qty 150

## 2011-07-24 MED ORDER — ACETAMINOPHEN 325 MG PO TABS
650.0000 mg | ORAL_TABLET | Freq: Four times a day (QID) | ORAL | Status: DC | PRN
  Administered 2011-07-24: 650 mg via ORAL
  Filled 2011-07-24: qty 2

## 2011-07-24 MED ORDER — BENZONATATE 100 MG PO CAPS
200.0000 mg | ORAL_CAPSULE | Freq: Three times a day (TID) | ORAL | Status: DC
  Administered 2011-07-24 – 2011-07-27 (×9): 200 mg via ORAL
  Filled 2011-07-24: qty 2
  Filled 2011-07-24: qty 1
  Filled 2011-07-24: qty 2
  Filled 2011-07-24: qty 1
  Filled 2011-07-24 (×2): qty 2
  Filled 2011-07-24: qty 1
  Filled 2011-07-24 (×3): qty 2

## 2011-07-24 MED ORDER — ENSURE PUDDING PO PUDG
1.0000 | Freq: Three times a day (TID) | ORAL | Status: DC
  Administered 2011-07-24 – 2011-07-27 (×9): 1 via ORAL
  Filled 2011-07-24: qty 1

## 2011-07-24 MED ORDER — LEVOFLOXACIN IN D5W 750 MG/150ML IV SOLN
750.0000 mg | INTRAVENOUS | Status: DC
  Administered 2011-07-24 – 2011-07-27 (×4): 750 mg via INTRAVENOUS
  Filled 2011-07-24 (×4): qty 150

## 2011-07-24 MED ORDER — HYDROCOD POLST-CHLORPHEN POLST 10-8 MG/5ML PO LQCR
5.0000 mL | Freq: Two times a day (BID) | ORAL | Status: DC
  Administered 2011-07-24 – 2011-07-27 (×7): 5 mL via ORAL
  Filled 2011-07-24 (×7): qty 5

## 2011-07-24 MED ORDER — SODIUM CHLORIDE 0.9 % IV SOLN
INTRAVENOUS | Status: DC
  Administered 2011-07-24: 50 mL via INTRAVENOUS
  Administered 2011-07-25 – 2011-07-26 (×2): via INTRAVENOUS

## 2011-07-24 MED ORDER — NICOTINE 14 MG/24HR TD PT24
14.0000 mg | MEDICATED_PATCH | Freq: Every day | TRANSDERMAL | Status: DC
  Administered 2011-07-24 – 2011-07-27 (×4): 14 mg via TRANSDERMAL
  Filled 2011-07-24 (×7): qty 1

## 2011-07-24 MED ORDER — AZITHROMYCIN 250 MG PO TABS
500.0000 mg | ORAL_TABLET | Freq: Once | ORAL | Status: AC
  Administered 2011-07-24: 500 mg via ORAL
  Filled 2011-07-24: qty 2

## 2011-07-24 MED ORDER — MORPHINE SULFATE 2 MG/ML IJ SOLN
2.0000 mg | INTRAMUSCULAR | Status: DC | PRN
  Administered 2011-07-24 (×3): 2 mg via INTRAVENOUS
  Filled 2011-07-24 (×3): qty 1

## 2011-07-24 MED ORDER — TRAZODONE HCL 50 MG PO TABS
150.0000 mg | ORAL_TABLET | Freq: Every day | ORAL | Status: DC
  Administered 2011-07-24 – 2011-07-26 (×3): 150 mg via ORAL
  Filled 2011-07-24 (×3): qty 3

## 2011-07-24 MED ORDER — IOHEXOL 300 MG/ML  SOLN
100.0000 mL | Freq: Once | INTRAMUSCULAR | Status: AC | PRN
  Administered 2011-07-24: 100 mL via INTRAVENOUS

## 2011-07-24 MED ORDER — HYDROMORPHONE HCL PF 1 MG/ML IJ SOLN
0.5000 mg | Freq: Once | INTRAMUSCULAR | Status: AC
  Administered 2011-07-24: 0.5 mg via INTRAVENOUS
  Filled 2011-07-24: qty 1

## 2011-07-24 MED ORDER — ADULT MULTIVITAMIN W/MINERALS CH
1.0000 | ORAL_TABLET | Freq: Every day | ORAL | Status: DC
  Administered 2011-07-24 – 2011-07-27 (×4): 1 via ORAL
  Filled 2011-07-24 (×4): qty 1

## 2011-07-24 NOTE — Progress Notes (Signed)
PPD tuberculin skin test preformed-rt inner forearm site. Area circled w/ pen. Pt states that her mother always tested positve and that her mother also once had an allergic local reaction to the PPD test. We will observe pt for possible reaction.

## 2011-07-24 NOTE — Progress Notes (Signed)
0620-Coughing extremely hard and rating headache 10/10.

## 2011-07-24 NOTE — ED Notes (Signed)
edpa notified that pt wants pain meds, no orders received at this time.

## 2011-07-24 NOTE — ED Notes (Signed)
hospitalist in with pt

## 2011-07-24 NOTE — Progress Notes (Signed)
ANTIBIOTIC CONSULT NOTE - INITIAL  Pharmacy Consult for Vancomycin Indication: pneumonia  No Known Allergies  Patient Measurements: Height: 5\' 8"  (172.7 cm) Weight: 203 lb 4.2 oz (92.2 kg) IBW/kg (Calculated) : 63.9  Adjusted Body Weight:   Vital Signs: Temp: 98.2 F (36.8 C) (03/02 0800) Temp src: Oral (03/02 0800) BP: 143/75 mmHg (03/02 0800) Pulse Rate: 75  (03/02 0800) Intake/Output from previous day: 03/01 0701 - 03/02 0700 In: 1346 [P.O.:946; I.V.:50; IV Piggyback:350] Out: 1200 [Urine:1200] Intake/Output from this shift: Total I/O In: 530 [P.O.:480; I.V.:50] Out: 1001 [Urine:1000; Stool:1]  Labs:  [facility] 07/24/11 0503 07/24/11  WBC 11.3* 12.9*  HGB 10.2* 10.7*  PLT 338 347  LABCREA -- --  CREATININE 0.72 0.77   Estimated Creatinine Clearance: 91 ml/min (by C-G formula based on Cr of 0.72).    Microbiology: Recent Results (from the past 720 hour(s))  CULTURE, BLOOD (ROUTINE X 2)     Status: Normal (Preliminary result)   Collection Time   07/24/11  3:38 AM      Component Value Range Status Comment   Specimen Description Blood RIGHT ANTECUBITAL   Final    Special Requests BOTTLES DRAWN AEROBIC AND ANAEROBIC 6CC   Final    Culture NO GROWTH <24 HRS   Final    Report Status PENDING   Incomplete   CULTURE, BLOOD (ROUTINE X 2)     Status: Normal (Preliminary result)   Collection Time   07/24/11  3:41 AM      Component Value Range Status Comment   Specimen Description Blood BLOOD RIGHT WRIST   Final    Special Requests BOTTLES DRAWN AEROBIC AND ANAEROBIC 6CC   Final    Culture NO GROWTH <24 HRS   Final    Report Status PENDING   Incomplete   MRSA PCR SCREENING     Status: Normal   Collection Time   07/24/11  4:24 AM      Component Value Range Status Comment   MRSA by PCR NEGATIVE  NEGATIVE  Final   CULTURE, SPUTUM-ASSESSMENT     Status: Normal   Collection Time   07/24/11  4:56 AM      Component Value Range Status Comment   Specimen Description SPUTUM    Final    Special Requests NONE   Final    Sputum evaluation     Final    Value: THIS SPECIMEN IS ACCEPTABLE. RESPIRATORY CULTURE REPORT TO FOLLOW.   Report Status 07/24/2011 FINAL   Final     Medical History: Past Medical History  Diagnosis Date  . Anxiety   . Insomnia   . Depression   . Back pain   . Migraines   . Diverticulitis     Medications:  Scheduled:    . acetaminophen  1,000 mg Oral Once  . albuterol  2 puff Inhalation QID  . albuterol  2.5 mg Nebulization Once  . ALPRAZolam  1 mg Oral QID  . azithromycin  500 mg Oral Once  . cefTRIAXone (ROCEPHIN) IVPB 1 gram/50 mL D5W  1 g Intravenous Once  . feeding supplement  1 Container Oral TID WC  . HYDROmorphone  0.5 mg Intravenous Once  . levofloxacin (LEVAQUIN) IV  750 mg Intravenous Q24H  . mulitivitamin with minerals  1 tablet Oral Daily  . nicotine  14 mg Transdermal Daily  . ondansetron  4 mg Intravenous Once  . sodium chloride  1,000 mL Intravenous Once  . traZODone  150 mg Oral QHS  .  tuberculin  5 Units Intradermal Once  . vancomycin  1,000 mg Intravenous Once  . vancomycin  1,000 mg Intravenous Q12H   Assessment: Empiric therapy. Also on Levofloxacin.  Goal of Therapy:  Vancomycin trough level 15-20 mcg/ml  Plan:  Vancomycin 1000 mg IV every 12 hours. Vancomycin trough at steady state.  Gilman Buttner, [place] J 07/24/2011,10:10 AM

## 2011-07-24 NOTE — ED Provider Notes (Signed)
Shelda Jakes, MD  Medical screening examination/treatment/procedure(s) were conducted as a shared visit with non-physician practitioner(s) and myself.  I personally evaluated the patient during the encounter  The patient seen by me will be admitted by the hospitalist for pyelonephritis and possible community-acquired pneumonia urine culture sent current vital signs not consistent with sepsis. Antibiotic Rocephin started in the emergency department along with Zithromax. Patient going to a MedSurg bed.      Shelda Jakes, MD 07/24/11 [phone]

## 2011-07-24 NOTE — Progress Notes (Signed)
Patient admitted earlier today by Dr. Venetia Constable  Patient seen and examined, database reviewed.  She has been coughing for the past 3 months, and reports that she noted that she coughs up blood after a prolonged coughing spell.  She has also had fevers (during both days and nights) for the past few weeks  She is an active smoker.  She has been admitted for treatment of pyelonephritis with antibiotics There were also concerns that the patient may have TB, so she was placed on respiratory isolation and AFB smears have been sent.  I think the probability of this is low.  She has a negative cxr and CT chest.  She does have emphysema and her symptoms may be related to an acute bronchitis and concurrent smoking. In any case, we will follow up AFB smears and continue supportive treatment.  Her ESR is 98, and this may be related to her underlying urinary infection.  RF and ANA are pending.   Her main complaint is that she has a headache, which is worse with coughing.  We will give her mucolytics and tussionex.  She does have chronic pain and has been on vicodin chronically.

## 2011-07-24 NOTE — H&P (Signed)
PCP:   Josue Hector, MD, MD   Chief Complaint:  Cough with hemoptysis for last few weeks, cough for 3 moths. Fever for 1 week.  HPI: Wendy Arnold is a very sweet lady, who has not had regular health care maintenance due to lack of health insurance. She comes in with 3 months history of cough productive of alternate green and yellow phlegm, culminating in some scant hemoptysis in the last 2 weeks or so. She has developed high grade fever in the last 1 week. Her voice in now hoarse. She is not sure if she has lost weight or not, but has had to use a fan in the house. In the last week she also had some right flank pain and some dysuria. She also had some vomiting. Appetite has been poor. She denies positive TB contact. Says skin tb test negative in June last year. She has also noticed some stiff joins. She is an alpha antitrypsin deficiency gene carrier. Patient being treated for Pyelonephritis/CAP in ED.  Review of Systems: Unremarkable except as highlighted in the hx of present illness.  Past Medical History: Past Medical History  Diagnosis Date  . Anxiety   . Insomnia   . Depression   . Back pain   . Migraines   . Diverticulitis    Past Surgical History  Procedure Date  . Back surgery   . Sinus endo with fusion   . Abdominal hysterectomy   . Toe surgery     Medications: Prior to Admission medications   Medication Sig Start Date End Date Taking? Authorizing Provider  acetaminophen (TYLENOL) 325 MG tablet Take 1,300 mg by mouth every 6 (six) hours as needed. For pain. **Alternating with Ibuprofen 200-800mg  every 2 to 3 hours as needed for pain**    Yes Historical Provider, MD  ALPRAZolam (XANAX) 1 MG tablet Take 1 mg by mouth 4 (four) times daily.     Yes Historical Provider, MD  aspirin EC 81 MG tablet Take 162 mg by mouth daily.   Yes Historical Provider, MD  guaiFENesin-codeine (ROBITUSSIN AC) 100-10 MG/5ML syrup Take 5 mLs by mouth 3 (three) times daily as needed. For cough    Yes Historical Provider, MD  HYDROcodone-acetaminophen (VICODIN) 5-500 MG per tablet Take 1 tablet by mouth every 6 (six) hours as needed. For pain   Yes Historical Provider, MD  meloxicam (MOBIC) 7.5 MG tablet Take 7.5 mg by mouth daily.   Yes Historical Provider, MD  Menthol, Topical Analgesic, (BIOFREEZE EX) Apply 1 application topically as needed. For pain    Yes Historical Provider, MD  traZODone (DESYREL) 150 MG tablet Take 150 mg by mouth at bedtime.     Yes Historical Provider, MD    Allergies:  No Known Allergies  Social History:  reports that she has been smoking Cigarettes.  She has a 20 pack-year smoking history. She has never used smokeless tobacco. She reports that she drinks alcohol. She reports that she does not use illicit drugs.   Family History: Family History  Problem Relation Age of Onset  . Heart failure Mother   . Cancer Father   . Cancer Brother   . Cancer Brother   . Cancer Brother     Physical Exam: Filed Vitals:   07/23/11 2231 07/23/11 2342 07/24/11 0050  BP: 142/56    Pulse: 98    Temp: 103 F (39.4 C) 99.9 F (37.7 C)   TempSrc:  Oral   Resp: 20    Weight: 86.183  kg (190 lb)    SpO2:   96%   Lethargic, apathetic, engaging but weak. No oral thrush. Dry oral mucosa. No jvd. No carotid bruits. Bilateral air entry, bibasilar rhonchi. Some scant hemoptysis in bed side plastic receiver. S1S2 heard, no murmurs. RRR. Abdomen- soft, soft non tender. +BS. No palpable organomegaly. Muscle wasting peripherally. No pedal edema. Pulses equal. No stigmata of chronic arthritis.   Labs on Admission:   [facility] 07/24/11  NA 135  K 4.1  CL 101  CO2 24  GLUCOSE 105*  BUN 10  CREATININE 0.77  CALCIUM 9.0  MG --  PHOS --    Basename 07/24/11  AST 27  ALT 31  ALKPHOS 123*  BILITOT 0.3  PROT 6.9  ALBUMIN 3.0*   No results found for this basename: LIPASE:2,AMYLASE:2 in the last 72 hours  Basename 07/24/11  WBC 12.9*  NEUTROABS 9.2*  HGB 10.7*   HCT 31.8*  MCV 94.6  PLT 347   No results found for this basename: CKTOTAL:3,CKMB:3,CKMBINDEX:3,TROPONINI:3 in the last 72 hours No results found for this basename: TSH,T4TOTAL,FREET3,T3FREE,THYROIDAB in the last 72 hours No results found for this basename: VITAMINB12:2,FOLATE:2,FERRITIN:2,TIBC:2,IRON:2,RETICCTPCT:2 in the last 72 hours  Radiological Exams on Admission: Dg Chest 2 View  07/02/2011  *RADIOLOGY REPORT*  Clinical Data: Cough, back pain, smoking history  CHEST - 2 VIEW  Comparison: Chest x-ray of 11/07/2004  Findings: The lungs are clear.  Mediastinal contours appear normal. The heart is within normal limits in size.  No bony abnormality is seen.  IMPRESSION: No active lung disease.  Original Report Authenticated By: Juline Patch, M.D.   Dg Cervical Spine Complete  07/02/2011  *RADIOLOGY REPORT*  Clinical Data: Neck pain.  CERVICAL SPINE - COMPLETE 4+ VIEW  Comparison: None  Findings: Mild degenerative cervical spondylosis with disc disease and facet disease.  No acute bony findings and no abnormal prevertebral soft tissue swelling.  The alignment is maintained. The neural foramen are grossly patent.  There is mild bony foraminal narrowing on the left at C5-6 due to uncinate spurring.  The C1-2 articulations are maintained.  The lung apices are clear. Carotid artery calcifications are noted.  IMPRESSION:  1.  Mild degenerative cervical spondylosis but no acute bony findings. 2.  Mild foraminal narrowing on the left at C5-6 due to uncinate spurring.  Original Report Authenticated By: P. Loralie Champagne, M.D.   Dg Thoracic Spine W/swimmers  07/02/2011  *RADIOLOGY REPORT*  Clinical Data: Back pain.  THORACIC SPINE - 2 VIEW + SWIMMERS  Comparison: None  Findings: The lateral film demonstrates normal alignment of the thoracic vertebral bodies.  Disc spaces and vertebral bodies are maintained.  No acute bony findings, destructive bony changes or abnormal paraspinal soft tissue swelling.  The  visualized posterior ribs appear normal.  IMPRESSION: Normal alignment and no acute bony findings.  Mild degenerative changes.  Original Report Authenticated By: P. Loralie Champagne, M.D.   Dg Lumbar Spine Complete  07/02/2011  *RADIOLOGY REPORT*  Clinical Data: Back pain.  LUMBAR SPINE - COMPLETE 4+ VIEW  Comparison: None  Findings: The lumbar vertebral bodies are normally aligned. Moderate degenerative changes noted at L5-S1.  The facets are normally aligned.  No pars defects.  Extensive aortic calcifications are noted without definite aneurysm.  The visualized bony pelvis is intact.  IMPRESSION:  1.  Normal alignment and no acute bony findings. 2.  Degenerative disc disease at L5-S1. 3.  Advanced vascular calcifications for age.  Original Report Authenticated By: P. MARK  Pecolia Ades, M.D.   Dg Bone Density  07/08/2011  The Bone Mineral Densitometry hard-copy report (which includes all data, graphical display, and FRAX results when applicable) has been sent directly to the ordering physician.  This report can also be obtained electronically by viewing images for this exam through the performing facility's EMR, or by logging directly into [employer].  Original Report Authenticated By: Lollie Marrow, M.D.   Dg Chest Port 1 View  07/24/2011  *RADIOLOGY REPORT*  Clinical Data: Cough and fever.  PORTABLE CHEST - 1 VIEW  Comparison: Chest radiograph performed 07/02/2011  Findings: The lungs are well-aerated.  Mild medial right basilar airspace opacity may reflect atelectasis or possibly pneumonia. There is no evidence of pleural effusion or pneumothorax.  The cardiomediastinal silhouette is within normal limits.  No acute osseous abnormalities are seen.  IMPRESSION: Mild medial right basilar airspace opacity may reflect atelectasis or possibly pneumonia.  Original Report Authenticated By: Tonia Ghent, M.D.    Assessment 61 year old tobacco smoker, an alpha 1 antitrypsin carrier, who presents wityh 3 month  history of cough, now has dysuria, hemoptysis, with finding of Right middle lobe pna, and uti. Concern for TB versus other lung diseases, including sarcoidosis, malignancy, rheumatoid lung, wegener's, among others. Plan .Cough with hemoptysis- airborne isolation, tb tests-skin/sputum, sputum culture, ana, rf, ct chest with contrast. Consider pulmonary consult if sputum unrevealing. Smoking cessation counseling given. Vanc/levaquin. Marland KitchenUTI (lower urinary tract infection)- check urine culture. Ct badomen/pelvis. On levaquin. .Community acquired pneumonia- on vanc/levaquin. Dvt/gi prophylaxis. Condition very closely guarded. Discussed plan of care with family at bed side.   Wendy Arnold [phone]. 07/24/2011, 2:37 AM

## 2011-07-24 NOTE — ED Notes (Signed)
Pt has no other complaints 

## 2011-07-24 NOTE — Progress Notes (Signed)
C/o fever. Took temperature. Noted to be 103.2 orally. Medicated with tylenol 650 mg po.

## 2011-07-24 NOTE — ED Provider Notes (Signed)
History     CSN: [id number]  Arrival date & time 07/23/11  2225   First MD Initiated Contact with Patient 07/24/11 0005      Chief Complaint  Patient presents with  . Nausea  . Hematemesis  . Fever    (Consider location/radiation/quality/duration/timing/severity/associated sxs/prior treatment) HPI Comments: Patient and a friend of the patient in the room report that she has had problems with nausea vomiting and generally not feeling good for almost 2 months. She states that the problem would get better and then come back. Over the past week the patient has been having problems with fevers as high as 103. She's been having nausea and vomiting. She's had one episode of blowing her nose with some blood mixed in the mucus. She's had one episode of coughing with some blood mixed in mucus from the cough. She's had problems with nausea, weakness, body aches, generally not feeling well, back pain, and poor appetite. Patient reports some problem with headache that is different than her usual migraines. Patient presents tonight for assistance with her multiple problems.  Patient is a 61 y.o. female presenting with fever. The history is provided by the patient and a friend.  Fever Primary symptoms of the febrile illness include fever, fatigue, headaches, cough, nausea, vomiting, myalgias and arthralgias. Primary symptoms do not include wheezing, shortness of breath, abdominal pain or dysuria.  The headache is not associated with photophobia.    Past Medical History  Diagnosis Date  . Anxiety   . Insomnia   . Depression   . Back pain   . Migraines   . Diverticulitis     Past Surgical History  Procedure Date  . Back surgery   . Sinus endo with fusion   . Abdominal hysterectomy   . Toe surgery     Family History  Problem Relation Age of Onset  . Heart failure Mother   . Cancer Father   . Cancer Brother   . Cancer Brother   . Cancer Brother     History  Substance Use Topics  .  Smoking status: Current Everyday Smoker -- 0.5 packs/day for 40 years    Types: Cigarettes  . Smokeless tobacco: Never Used  . Alcohol Use: Yes     rarely    OB History    Grav Para Term Preterm Abortions TAB SAB Ect Mult Living   5 1  1 4     1       Review of Systems  Constitutional: Positive for fever, chills, appetite change and fatigue. Negative for activity change.       All ROS Neg except as noted in HPI  HENT: Positive for congestion and postnasal drip. Negative for nosebleeds and neck pain.   Eyes: Negative for photophobia and discharge.  Respiratory: Positive for cough. Negative for shortness of breath and wheezing.   Cardiovascular: Negative for chest pain and palpitations.  Gastrointestinal: Positive for nausea and vomiting. Negative for abdominal pain and blood in stool.  Genitourinary: Positive for flank pain. Negative for dysuria, frequency and hematuria.  Musculoskeletal: Positive for myalgias, back pain and arthralgias.  Skin: Negative.   Neurological: Positive for light-headedness and headaches. Negative for dizziness, seizures and speech difficulty.  Psychiatric/Behavioral: Negative for hallucinations and confusion.       Depression    Allergies  Review of patient's allergies indicates no known allergies.  Home Medications   Current Outpatient Rx  Name Route Sig Dispense Refill  . ACETAMINOPHEN 325 MG PO TABS  Oral Take 1,300 mg by mouth every 6 (six) hours as needed. For pain. **Alternating with Ibuprofen 200-800mg  every 2 to 3 hours as needed for pain**     . ALPRAZOLAM 1 MG PO TABS Oral Take 1 mg by mouth 4 (four) times daily.      . ASPIRIN EC 81 MG PO TBEC Oral Take 162 mg by mouth daily.    . GUAIFENESIN-CODEINE 100-10 MG/5ML PO SYRP Oral Take 5 mLs by mouth 3 (three) times daily as needed. For cough    . HYDROCODONE-ACETAMINOPHEN 5-500 MG PO TABS Oral Take 1 tablet by mouth every 6 (six) hours as needed. For pain    . MELOXICAM 7.5 MG PO TABS Oral Take  7.5 mg by mouth daily.    Marland Kitchen BIOFREEZE EX Apply externally Apply 1 application topically as needed. For pain     . TRAZODONE HCL 150 MG PO TABS Oral Take 150 mg by mouth at bedtime.        BP 142/56  Pulse 98  Temp(Src) 99.9 F (37.7 C) (Oral)  Resp 20  Wt 190 lb (86.183 kg)  Physical Exam  Nursing note and vitals reviewed. Constitutional: She is oriented to person, place, and time. She appears well-developed and well-nourished.  Non-toxic appearance.  HENT:  Head: Normocephalic.  Right Ear: Tympanic membrane and external ear normal.  Left Ear: Tympanic membrane and external ear normal.  Eyes: EOM and lids are normal. Pupils are equal, round, and reactive to light.  Neck: Normal range of motion. Carotid bruit is not present. No tracheal deviation present.  Cardiovascular: Regular rhythm, normal heart sounds, intact distal pulses and normal pulses.  Tachycardia present.  Exam reveals no friction rub.   Pulmonary/Chest: Breath sounds normal. No respiratory distress.       Moderate chest wall tenderness. Symmetrical rise and fall of the chest. There is bilateral rhonchi. And scattered soft wheezes. Right greater than left.  Abdominal: Soft. Bowel sounds are normal. There is no guarding.       Right CVA tenderness.  Musculoskeletal: Normal range of motion.       Soreness with range of motion of the of both shoulders and both hips and knees. (This is not new)  Lymphadenopathy:       Head (right side): No submandibular adenopathy present.       Head (left side): No submandibular adenopathy present.    She has no cervical adenopathy.  Neurological: She is alert and oriented to person, place, and time. She has normal strength. No cranial nerve deficit or sensory deficit.  Skin: Skin is warm and dry. There is pallor.  Psychiatric: Her speech is normal.       Tearful and anxious    ED Course  Procedures (including critical care time)  Labs Reviewed  URINALYSIS, ROUTINE W REFLEX  MICROSCOPIC - Abnormal; Notable for the following:    Hgb urine dipstick SMALL (*)    Nitrite POSITIVE (*)    Leukocytes, UA SMALL (*)    All other components within normal limits  URINE MICROSCOPIC-ADD ON - Abnormal; Notable for the following:    Squamous Epithelial / LPF FEW (*)    Bacteria, UA MANY (*)    All other components within normal limits  CBC - Abnormal; Notable for the following:    WBC 12.9 (*)    RBC 3.36 (*)    Hemoglobin 10.7 (*)    HCT 31.8 (*)    All other components within normal limits  DIFFERENTIAL - Abnormal; Notable for the following:    Neutro Abs 9.2 (*)    Monocytes Relative 13 (*)    Monocytes Absolute 1.6 (*)    All other components within normal limits  BASIC METABOLIC PANEL - Abnormal; Notable for the following:    Glucose, Bld 105 (*)    All other components within normal limits  HEPATIC FUNCTION PANEL - Abnormal; Notable for the following:    Albumin 3.0 (*)    Alkaline Phosphatase 123 (*)    Indirect Bilirubin 0.2 (*)    All other components within normal limits  LACTIC ACID, PLASMA  URINE CULTURE  PROCALCITONIN   Dg Chest Port 1 View  07/24/2011  *RADIOLOGY REPORT*  Clinical Data: Cough and fever.  PORTABLE CHEST - 1 VIEW  Comparison: Chest radiograph performed 07/02/2011  Findings: The lungs are well-aerated.  Mild medial right basilar airspace opacity may reflect atelectasis or possibly pneumonia. There is no evidence of pleural effusion or pneumothorax.  The cardiomediastinal silhouette is within normal limits.  No acute osseous abnormalities are seen.  IMPRESSION: Mild medial right basilar airspace opacity may reflect atelectasis or possibly pneumonia.  Original Report Authenticated By: Tonia Ghent, M.D.     Dx: 1. Pyelonephritis 2. Right lung pneumonia   MDM  I have reviewed nursing notes, vital signs, and all appropriate lab and imaging results for this patient. Patient presents with 2 months of illness that has been coming and  going. In the last week the patient has been more TO. The patient's friends and the patient reports temperatures of 103 generally not feeling well cough congestion malaise weakness headache nausea vomiting and body aches. The patient is noted to have a rather advanced urinary tract infection. The chest x-ray suggests possible right lung pneumonia. The white blood cell count is elevated at 12,900. Hepatic function reveals the albumin to be low at 3 the alkaline phosphatase elevated at 123. The probe count sent on is less than 0.10 with low probability of systemic infection  The patient was seen with me by Dr. Deretha Emory. Plan for admission discussed with the patient in detail. Consultation placed to [facility] hospitalist.       Kathie Dike, [place] 07/25/11 [phone]

## 2011-07-24 NOTE — ED Notes (Signed)
resp paged

## 2011-07-24 NOTE — Plan of Care (Signed)
Problem: Consults Goal: Pneumonia Patient Education See Patient Educatio Module for education specifics. Outcome: Progressing Discussed treatment plan for pneumonia with patient, will need reinforcement  Problem: Phase I Progression Outcomes Goal: Dyspnea controlled at rest Outcome: Completed/Met Date Met:  07/24/11 On Room air 96% Goal: OOB as tolerated unless otherwise ordered Outcome: Progressing Limited to room 8, pending TB testing Goal: First antibiotic given within 6hrs of admit Outcome: Completed/Met Date Met:  07/24/11 IV Rocephin and PO zithromax for non ICU admission Goal: Confirm chest x-ray completed Outcome: Completed/Met Date Met:  07/24/11 Mild pneumonia noted

## 2011-07-24 NOTE — Progress Notes (Signed)
Transfer report called to a bailey rn . Pt transfering to room 341-a negative pressure room. Pt remains on air borne precautions for possible TB. Iv in lt forearms infusing w/o difficulty. Pt has strong npc.afebrile. Continues to c/o severe head pain. Transferred via w/c.

## 2011-07-25 LAB — BASIC METABOLIC PANEL
BUN: 9 mg/dL (ref 6–23)
CO2: 25 mEq/L (ref 19–32)
Chloride: 105 mEq/L (ref 96–112)
GFR calc non Af Amer: >90 mL/min (ref >90)
Glucose, Bld: 95 mg/dL (ref 70–99)
Potassium: 3.8 mEq/L (ref 3.5–5.1)
Sodium: 140 mEq/L (ref 135–145)

## 2011-07-25 LAB — CBC
HCT: 34.5 % — ABNORMAL LOW (ref 36.0–46.0)
Hemoglobin: 11.4 g/dL — ABNORMAL LOW (ref 12.0–15.0)
MCH: 31.8 pg (ref 26.0–34.0)
MCHC: 33 g/dL (ref 30.0–36.0)
MCV: 96.1 fL (ref 78.0–100.0)
Platelets: 395 10*3/uL (ref 150–400)
RBC: 3.59 MIL/uL — ABNORMAL LOW (ref 3.87–5.11)
RDW: 12.9 % (ref 11.5–15.5)
WBC: 12.7 10*3/uL — ABNORMAL HIGH (ref 4.0–10.5)

## 2011-07-25 LAB — URINE CULTURE: Culture: NO GROWTH

## 2011-07-25 NOTE — Plan of Care (Signed)
Problem: Phase I Progression Outcomes Goal: Vital Signs stable- temperature less than 102 Outcome: Not Met (add Reason) Fever 103 this shift

## 2011-07-25 NOTE — Progress Notes (Signed)
Subjective: No hemoptysis since admission, cough a little better, no new complaints, feels a little better  Objective: Vital signs in last 24 hours: Temp:  [99 F (37.2 C)-103.2 F (39.6 C)] 99 F (37.2 C) (03/03 0520) Pulse Rate:  [79] 79  (03/03 0520) Resp:  [18] 18  (03/03 0520) BP: (168)/(90) 168/90 mmHg (03/03 0520) SpO2:  [94 %-97 %] 96 % (03/03 1202) Weight change:  Last BM Date: 07/25/11  Intake/Output from previous day: 03/02 0701 - 03/03 0700 In: 1886.7 [P.O.:1200; I.V.:686.7] Out: 2301 [Urine:2300; Stool:1]     Physical Exam: General: Alert, awake, oriented x3, in no acute distress. HEENT: No bruits, no goiter. Heart: Regular rate and rhythm, without murmurs, rubs, gallops. Lungs: Clear to auscultation bilaterally. Abdomen: Soft, nontender, nondistended, positive bowel sounds. Extremities: No clubbing cyanosis or edema with positive pedal pulses. Neuro: Grossly intact, nonfocal.    Lab Results: Basic Metabolic Panel:  Basename 07/25/11 0357 07/24/11 0503  NA 140 138  K 3.8 3.6  CL 105 104  CO2 25 24  GLUCOSE 95 92  BUN 9 10  CREATININE 0.73 0.72  CALCIUM 9.3 8.5  MG -- 1.8  PHOS -- 4.3   Liver Function Tests:  Basename 07/24/11 0503 07/24/11  AST 21 27  ALT 28 31  ALKPHOS 114 123*  BILITOT 0.2* 0.3  PROT 6.5 6.9  ALBUMIN 2.8* 3.0*   No results found for this basename: LIPASE:2,AMYLASE:2 in the last 72 hours No results found for this basename: AMMONIA:2 in the last 72 hours CBC:  Basename 07/25/11 0357 07/24/11 0503 07/24/11  WBC 12.7* 11.3* --  NEUTROABS -- -- 9.2*  HGB 11.4* 10.2* --  HCT 34.5* 30.9* --  MCV 96.1 95.7 --  PLT 395 338 --   Cardiac Enzymes: No results found for this basename: CKTOTAL:3,CKMB:3,CKMBINDEX:3,TROPONINI:3 in the last 72 hours BNP:  Basename 07/24/11 0503  PROBNP 876.5*   D-Dimer: No results found for this basename: DDIMER:2 in the last 72 hours CBG: No results found for this basename: GLUCAP:6 in  the last 72 hours Hemoglobin A1C: No results found for this basename: HGBA1C in the last 72 hours Fasting Lipid Panel: No results found for this basename: CHOL,HDL,LDLCALC,TRIG,CHOLHDL,LDLDIRECT in the last 72 hours Thyroid Function Tests:  Basename 07/24/11 0503  TSH 2.299  T4TOTAL --  FREET4 --  T3FREE --  THYROIDAB --   Anemia Panel:  Basename 07/24/11  VITAMINB12 447  FOLATE --  FERRITIN --  TIBC --  IRON --  RETICCTPCT --   Coagulation:  Basename 07/24/11 0503  LABPROT 15.3*  INR 1.18   Urine Drug Screen: Drugs of Abuse  No results found for this basename: labopia, cocainscrnur, labbenz, amphetmu, thcu, labbarb    Alcohol Level: No results found for this basename: ETH:2 in the last 72 hours Urinalysis:  Basename 07/23/11 2237  COLORURINE YELLOW  LABSPEC 1.015  PHURINE 6.0  GLUCOSEU NEGATIVE  HGBUR SMALL*  BILIRUBINUR NEGATIVE  KETONESUR NEGATIVE  PROTEINUR NEGATIVE  UROBILINOGEN 0.2  NITRITE POSITIVE*  LEUKOCYTESUR SMALL*    Recent Results (from the past 240 hour(s))  CULTURE, BLOOD (ROUTINE X 2)     Status: Normal (Preliminary result)   Collection Time   07/24/11  3:38 AM      Component Value Range Status Comment   Specimen Description Blood RIGHT ANTECUBITAL   Final    Special Requests BOTTLES DRAWN AEROBIC AND ANAEROBIC 6CC   Final    Culture NO GROWTH 1 DAY   Final  Report Status PENDING   Incomplete   CULTURE, BLOOD (ROUTINE X 2)     Status: Normal (Preliminary result)   Collection Time   07/24/11  3:41 AM      Component Value Range Status Comment   Specimen Description Blood BLOOD RIGHT WRIST   Final    Special Requests BOTTLES DRAWN AEROBIC AND ANAEROBIC 6CC   Final    Culture NO GROWTH 1 DAY   Final    Report Status PENDING   Incomplete   MRSA PCR SCREENING     Status: Normal   Collection Time   07/24/11  4:24 AM      Component Value Range Status Comment   MRSA by PCR NEGATIVE  NEGATIVE  Final   CULTURE, SPUTUM-ASSESSMENT     Status:  Normal   Collection Time   07/24/11  4:56 AM      Component Value Range Status Comment   Specimen Description SPUTUM   Final    Special Requests NONE   Final    Sputum evaluation     Final    Value: THIS SPECIMEN IS ACCEPTABLE. RESPIRATORY CULTURE REPORT TO FOLLOW.   Report Status 07/24/2011 FINAL   Final   AFB CULTURE WITH SMEAR     Status: Normal (Preliminary result)   Collection Time   07/24/11  4:56 AM      Component Value Range Status Comment   Specimen Description SPUTUM   Final    Special Requests NONE   Final    ACID FAST SMEAR NO ACID FAST BACILLI SEEN   Final    Culture     Final    Value: CULTURE WILL BE EXAMINED FOR 6 WEEKS BEFORE ISSUING A FINAL REPORT   Report Status PENDING   Incomplete   CULTURE, RESPIRATORY     Status: Normal (Preliminary result)   Collection Time   07/24/11  4:56 AM      Component Value Range Status Comment   Specimen Description SPUTUM   Final    Special Requests NONE   Final    Gram Stain PENDING   Incomplete    Culture Culture reincubated for better growth   Final    Report Status PENDING   Incomplete   AFB CULTURE WITH SMEAR     Status: Normal (Preliminary result)   Collection Time   07/24/11  2:07 PM      Component Value Range Status Comment   Specimen Description SPUTUM   Final    Special Requests NONE   Final    ACID FAST SMEAR NO ACID FAST BACILLI SEEN   Final    Culture     Final    Value: CULTURE WILL BE EXAMINED FOR 6 WEEKS BEFORE ISSUING A FINAL REPORT   Report Status PENDING   Incomplete     Studies/Results: Ct Chest W Contrast  07/24/2011  *RADIOLOGY REPORT*  Clinical Data:  61 year old female with chest, abdominal and pelvic pain. Cough and fever.  CT CHEST, ABDOMEN AND PELVIS WITH CONTRAST  Technique:  Multidetector CT imaging of the chest, abdomen and pelvis was performed following the standard protocol during bolus administration of intravenous contrast.  Contrast: OMNIPAQUE IOHEXOL 300 MG/ML IJ SOLN  Comparison:  12/21/2005  abdominal/pelvic CT and 07/23/2011 chest radiograph  CT CHEST  Findings:  Mild coronary artery and aortic atherosclerotic calcifications are identified. The heart and great vessels are within normal limits otherwise.  There are no pleural or pericardial effusions. No enlarged or abnormal-appearing  lymph nodes are identified.  Mild centrilobular paraseptal emphysema identified. Minimal basilar and right middle lobe/lingular scarring again noted. There is no evidence of airspace disease, consolidation, suspicious nodules/mass or endobronchial/endotracheal lesions.  No acute or suspicious bony abnormalities are identified.  IMPRESSION: No acute abnormalities.  Coronary artery disease and emphysema.  CT ABDOMEN AND PELVIS  Findings:  The liver, spleen, pancreas, gallbladder, adrenal glands and left kidney are unremarkable. There is mild wall thickening of the right renal pelvis with mild perinephric stranding.  Heterogeneity of the renal parenchyma is identified on delayed images.  These findings likely represent infection/pyelonephritis. There is no evidence of renal abscess, hydronephrosis, solid renal mass or urinary calculi.  No free fluid, enlarged lymph nodes, biliary dilation or abdominal aortic aneurysm identified.  Descending and sigmoid colonic diverticulosis noted without diverticulitis.  No other bowel abnormalities are identified.  No acute or suspicious bony abnormalities are present.  Moderate degenerative disc disease and spondylosis at L5-S1 noted.  IMPRESSION: Right renal changes suspicious for UTI/pyelonephritis.  Correlate clinically.  Colonic diverticulosis without evidence of diverticulitis.  Original Report Authenticated By: Rosendo Gros, M.D.   Ct Abdomen Pelvis W Contrast  07/24/2011  *RADIOLOGY REPORT*  Clinical Data:  61 year old female with chest, abdominal and pelvic pain. Cough and fever.  CT CHEST, ABDOMEN AND PELVIS WITH CONTRAST  Technique:  Multidetector CT imaging of the chest,  abdomen and pelvis was performed following the standard protocol during bolus administration of intravenous contrast.  Contrast: OMNIPAQUE IOHEXOL 300 MG/ML IJ SOLN  Comparison:  12/21/2005 abdominal/pelvic CT and 07/23/2011 chest radiograph  CT CHEST  Findings:  Mild coronary artery and aortic atherosclerotic calcifications are identified. The heart and great vessels are within normal limits otherwise.  There are no pleural or pericardial effusions. No enlarged or abnormal-appearing lymph nodes are identified.  Mild centrilobular paraseptal emphysema identified. Minimal basilar and right middle lobe/lingular scarring again noted. There is no evidence of airspace disease, consolidation, suspicious nodules/mass or endobronchial/endotracheal lesions.  No acute or suspicious bony abnormalities are identified.  IMPRESSION: No acute abnormalities.  Coronary artery disease and emphysema.  CT ABDOMEN AND PELVIS  Findings:  The liver, spleen, pancreas, gallbladder, adrenal glands and left kidney are unremarkable. There is mild wall thickening of the right renal pelvis with mild perinephric stranding.  Heterogeneity of the renal parenchyma is identified on delayed images.  These findings likely represent infection/pyelonephritis. There is no evidence of renal abscess, hydronephrosis, solid renal mass or urinary calculi.  No free fluid, enlarged lymph nodes, biliary dilation or abdominal aortic aneurysm identified.  Descending and sigmoid colonic diverticulosis noted without diverticulitis.  No other bowel abnormalities are identified.  No acute or suspicious bony abnormalities are present.  Moderate degenerative disc disease and spondylosis at L5-S1 noted.  IMPRESSION: Right renal changes suspicious for UTI/pyelonephritis.  Correlate clinically.  Colonic diverticulosis without evidence of diverticulitis.  Original Report Authenticated By: Rosendo Gros, M.D.   Dg Chest Port 1 View  07/24/2011  *RADIOLOGY REPORT*   Clinical Data: Cough and fever.  PORTABLE CHEST - 1 VIEW  Comparison: Chest radiograph performed 07/02/2011  Findings: The lungs are well-aerated.  Mild medial right basilar airspace opacity may reflect atelectasis or possibly pneumonia. There is no evidence of pleural effusion or pneumothorax.  The cardiomediastinal silhouette is within normal limits.  No acute osseous abnormalities are seen.  IMPRESSION: Mild medial right basilar airspace opacity may reflect atelectasis or possibly pneumonia.  Original Report Authenticated By: Tonia Ghent, M.D.  Medications: Scheduled Meds:   . albuterol  2 puff Inhalation QID  . ALPRAZolam  1 mg Oral QID  . benzonatate  200 mg Oral TID  . chlorpheniramine-HYDROcodone  5 mL Oral Q12H  . feeding supplement  1 Container Oral TID WC  . levofloxacin (LEVAQUIN) IV  750 mg Intravenous Q24H  . mulitivitamin with minerals  1 tablet Oral Daily  . nicotine  14 mg Transdermal Daily  . traZODone  150 mg Oral QHS  . vancomycin  1,000 mg Intravenous Q12H   Continuous Infusions:   . sodium chloride 50 mL/hr at 07/25/11 0203   PRN Meds:.acetaminophen, HYDROcodone-acetaminophen, DISCONTD: morphine  Assessment/Plan:  #1. Pyelonephritis. Patient was started on empiric antibiotics. Urine culture is currently pending. Patient did have a fever last night. Has not been febrile since then. Continue current antibiotics and followup cultures. Blood cultures still show no growth.  #2. Chronic cough with hemoptysis. Chest CT was negative for any suspicious lesions. There was concern for tuberculosis and patient was placed on respiratory isolation on admission. She's had 2 smears are negative for acid-fast bacilli. She does not have any known risk factors for tuberculosis. She reports having hemoptysis after prolonged coughing spells. She's not had any specific night sweats or significant weight loss. I feel the probability of this being tuberculosis is extremely low and  therefore we will discontinue respiratory isolation. Will continue antitussives. Her chronic coughing related to her emphysema and chronic bronchitis. Her ESR was elevated which again could be related to her pyelonephritis. Rheumatoid factor is negative. ANA is pending.  #3. Chronic pain. Continue outpatient medications.  #4 disposition we'll continue to monitor the patient for now for any recurrence of fever. We will followup cultures and hopeful for discharge in the next 24-48 hours.   LOS: 2 days   Bruce Mayers [facility] Pager: [phone] 07/25/2011, 3:34 PM

## 2011-07-26 ENCOUNTER — Inpatient Hospital Stay (HOSPITAL_COMMUNITY): Payer: Self-pay

## 2011-07-26 LAB — BASIC METABOLIC PANEL
Calcium: 9 mg/dL (ref 8.4–10.5)
Creatinine, Ser: 0.71 mg/dL (ref 0.50–1.10)
GFR calc Af Amer: >90 mL/min (ref >90)

## 2011-07-26 LAB — CBC
MCHC: 33 g/dL (ref 30.0–36.0)
MCV: 96.2 fL (ref 78.0–100.0)
Platelets: 386 10*3/uL (ref 150–400)
RDW: 12.9 % (ref 11.5–15.5)
WBC: 9.9 10*3/uL (ref 4.0–10.5)

## 2011-07-26 LAB — ANA: Anti Nuclear Antibody(ANA): NEGATIVE

## 2011-07-26 MED ORDER — SODIUM CHLORIDE 0.9 % IJ SOLN
INTRAMUSCULAR | Status: AC
  Administered 2011-07-26: 3 mL
  Filled 2011-07-26: qty 3

## 2011-07-26 MED ORDER — VANCOMYCIN HCL 1000 MG IV SOLR
1250.0000 mg | Freq: Two times a day (BID) | INTRAVENOUS | Status: DC
  Administered 2011-07-26 – 2011-07-27 (×2): 1250 mg via INTRAVENOUS
  Filled 2011-07-26 (×4): qty 1250

## 2011-07-26 NOTE — Progress Notes (Signed)
Subjective: Feels a little better, still having cough and headache  Objective: Vital signs in last 24 hours: Temp:  [97.6 F (36.4 C)-98.5 F (36.9 C)] 97.6 F (36.4 C) (03/04 0652) Pulse Rate:  [65-72] 65  (03/04 0652) Resp:  [20] 20  (03/04 0652) BP: (104-143)/(78-84) 135/84 mmHg (03/04 0652) SpO2:  [95 %-96 %] 96 % (03/04 1610) Weight change:  Last BM Date: 07/25/11  Intake/Output from previous day: 03/03 0701 - 03/04 0700 In: 3073.3 [P.O.:560; I.V.:1613.3; IV Piggyback:900] Out: 800 [Urine:800] Total I/O In: 160 [P.O.:160] Out: -    Physical Exam: General: Alert, awake, oriented x3, in no acute distress. HEENT: No bruits, no goiter. Heart: Regular rate and rhythm, without murmurs, rubs, gallops. Lungs: Clear to auscultation bilaterally. Abdomen: Soft, nontender, nondistended, positive bowel sounds. Extremities: No clubbing cyanosis or edema with positive pedal pulses. Neuro: Grossly intact, nonfocal.    Lab Results: Basic Metabolic Panel:  Basename 07/26/11 0536 07/25/11 0357 07/24/11 0503  NA 141 140 --  K 3.8 3.8 --  CL 108 105 --  CO2 25 25 --  GLUCOSE 106* 95 --  BUN 9 9 --  CREATININE 0.71 0.73 --  CALCIUM 9.0 9.3 --  MG -- -- 1.8  PHOS -- -- 4.3   Liver Function Tests:  Basename 07/24/11 0503 07/24/11  AST 21 27  ALT 28 31  ALKPHOS 114 123*  BILITOT 0.2* 0.3  PROT 6.5 6.9  ALBUMIN 2.8* 3.0*   No results found for this basename: LIPASE:2,AMYLASE:2 in the last 72 hours No results found for this basename: AMMONIA:2 in the last 72 hours CBC:  Basename 07/26/11 0536 07/25/11 0357 07/24/11  WBC 9.9 12.7* --  NEUTROABS -- -- 9.2*  HGB 10.9* 11.4* --  HCT 33.0* 34.5* --  MCV 96.2 96.1 --  PLT 386 395 --   Cardiac Enzymes: No results found for this basename: CKTOTAL:3,CKMB:3,CKMBINDEX:3,TROPONINI:3 in the last 72 hours BNP:  Basename 07/24/11 0503  PROBNP 876.5*   D-Dimer: No results found for this basename: DDIMER:2 in the last 72  hours CBG: No results found for this basename: GLUCAP:6 in the last 72 hours Hemoglobin A1C: No results found for this basename: HGBA1C in the last 72 hours Fasting Lipid Panel: No results found for this basename: CHOL,HDL,LDLCALC,TRIG,CHOLHDL,LDLDIRECT in the last 72 hours Thyroid Function Tests:  Basename 07/24/11 0503  TSH 2.299  T4TOTAL --  FREET4 --  T3FREE --  THYROIDAB --   Anemia Panel:  Basename 07/24/11  VITAMINB12 447  FOLATE --  FERRITIN --  TIBC --  IRON --  RETICCTPCT --   Coagulation:  Basename 07/24/11 0503  LABPROT 15.3*  INR 1.18   Urine Drug Screen: Drugs of Abuse  No results found for this basename: labopia, cocainscrnur, labbenz, amphetmu, thcu, labbarb    Alcohol Level: No results found for this basename: ETH:2 in the last 72 hours Urinalysis:  Basename 07/23/11 2237  COLORURINE YELLOW  LABSPEC 1.015  PHURINE 6.0  GLUCOSEU NEGATIVE  HGBUR SMALL*  BILIRUBINUR NEGATIVE  KETONESUR NEGATIVE  PROTEINUR NEGATIVE  UROBILINOGEN 0.2  NITRITE POSITIVE*  LEUKOCYTESUR SMALL*    Recent Results (from the past 240 hour(s))  CULTURE, BLOOD (ROUTINE X 2)     Status: Normal (Preliminary result)   Collection Time   07/24/11  3:38 AM      Component Value Range Status Comment   Specimen Description Blood RIGHT ANTECUBITAL   Final    Special Requests BOTTLES DRAWN AEROBIC AND ANAEROBIC 6CC   Final  Culture NO GROWTH 1 DAY   Final    Report Status PENDING   Incomplete   CULTURE, BLOOD (ROUTINE X 2)     Status: Normal (Preliminary result)   Collection Time   07/24/11  3:41 AM      Component Value Range Status Comment   Specimen Description Blood BLOOD RIGHT WRIST   Final    Special Requests BOTTLES DRAWN AEROBIC AND ANAEROBIC 6CC   Final    Culture NO GROWTH 1 DAY   Final    Report Status PENDING   Incomplete   MRSA PCR SCREENING     Status: Normal   Collection Time   07/24/11  4:24 AM      Component Value Range Status Comment   MRSA by PCR  NEGATIVE  NEGATIVE  Final   CULTURE, SPUTUM-ASSESSMENT     Status: Normal   Collection Time   07/24/11  4:56 AM      Component Value Range Status Comment   Specimen Description SPUTUM   Final    Special Requests NONE   Final    Sputum evaluation     Final    Value: THIS SPECIMEN IS ACCEPTABLE. RESPIRATORY CULTURE REPORT TO FOLLOW.   Report Status 07/24/2011 FINAL   Final   AFB CULTURE WITH SMEAR     Status: Normal (Preliminary result)   Collection Time   07/24/11  4:56 AM      Component Value Range Status Comment   Specimen Description SPUTUM   Final    Special Requests NONE   Final    ACID FAST SMEAR NO ACID FAST BACILLI SEEN   Final    Culture     Final    Value: CULTURE WILL BE EXAMINED FOR 6 WEEKS BEFORE ISSUING A FINAL REPORT   Report Status PENDING   Incomplete   CULTURE, RESPIRATORY     Status: Normal (Preliminary result)   Collection Time   07/24/11  4:56 AM      Component Value Range Status Comment   Specimen Description SPUTUM   Final    Special Requests NONE   Final    Gram Stain PENDING   Incomplete    Culture NORMAL OROPHARYNGEAL FLORA   Final    Report Status PENDING   Incomplete   URINE CULTURE     Status: Normal   Collection Time   07/24/11  5:55 AM      Component Value Range Status Comment   Specimen Description URINE, CLEAN CATCH   Final    Special Requests Normal   Final    Culture  Setup Time [id number]   Final    Colony Count NO GROWTH   Final    Culture NO GROWTH   Final    Report Status 07/25/2011 FINAL   Final   AFB CULTURE WITH SMEAR     Status: Normal (Preliminary result)   Collection Time   07/24/11  2:07 PM      Component Value Range Status Comment   Specimen Description SPUTUM   Final    Special Requests NONE   Final    ACID FAST SMEAR NO ACID FAST BACILLI SEEN   Final    Culture     Final    Value: CULTURE WILL BE EXAMINED FOR 6 WEEKS BEFORE ISSUING A FINAL REPORT   Report Status PENDING   Incomplete     Studies/Results: No results  found.  Medications: Scheduled Meds:   . albuterol  2 puff  Inhalation QID  . ALPRAZolam  1 mg Oral QID  . benzonatate  200 mg Oral TID  . chlorpheniramine-HYDROcodone  5 mL Oral Q12H  . feeding supplement  1 Container Oral TID WC  . levofloxacin (LEVAQUIN) IV  750 mg Intravenous Q24H  . mulitivitamin with minerals  1 tablet Oral Daily  . nicotine  14 mg Transdermal Daily  . traZODone  150 mg Oral QHS  . vancomycin  1,250 mg Intravenous Q12H  . DISCONTD: vancomycin  1,000 mg Intravenous Q12H   Continuous Infusions:   . sodium chloride 50 mL/hr at 07/26/11 0217   PRN Meds:.acetaminophen, HYDROcodone-acetaminophen, DISCONTD: morphine  Assessment/Plan:  #1. Pyelonephritis. Patient was started on empiric antibiotics. Urine culture shows no growth. No fever for over 24hrs. Continue current antibiotics. Blood cultures still show no growth.   #2. Chronic cough with hemoptysis. Chest CT was negative for any suspicious lesions. There was concern for tuberculosis and patient was placed on respiratory isolation on admission. She's had 2 smears are negative for acid-fast bacilli. She does not have any known risk factors for tuberculosis. She reports having hemoptysis after prolonged coughing spells. She's not had any specific night sweats or significant weight loss. Her PPD was also negative.  I feel the probability of this being tuberculosis is extremely low and therefore we will discontinue respiratory isolation. Will continue antitussives. Her chronic coughing related to her emphysema and chronic bronchitis. Her ESR was elevated which again could be related to her pyelonephritis. Rheumatoid factor is negative. ANA is pending.  We will repeat chest xray today, no that she is rehydrated, to check for any developing pneumonia.  #3. Chronic pain. Continue outpatient medications.   #4 disposition we'll continue to monitor the patient for now for any recurrence of fever. We will followup cultures,  change to po abx in am and hopeful for discharge in the next 24hours.    LOS: 3 days   Patricio Popwell [facility] Pager: [phone] 07/26/2011, 10:12 AM

## 2011-07-26 NOTE — Progress Notes (Signed)
CARE MANAGEMENT NOTE 07/26/2011  Patient:  Wendy Arnold, Wendy Arnold   Account Number:  [id number]  Date Initiated:  07/26/2011  Documentation initiated by:  Rosemary Holms  Subjective/Objective Assessment:   Pt admitted with cough, fever and pain. PTA pt lived at home with her special needs brother who she cares for.     Action/Plan:   Spoke to pt at bedside. Denies HH needs of her own. Spoke about her taking care of herself which she states she will make more of a priority.   Anticipated DC Date:  07/28/2011   Anticipated DC Plan:  HOME/SELF CARE  In-house referral  Financial Counselor      DC Planning Services  CM consult      Choice offered to / List presented to:             Status of service:  In process, will continue to follow Medicare Important Message given?   (If response is "NO", the following Medicare IM given date fields will be blank) Date Medicare IM given:   Date Additional Medicare IM given:    Discharge Disposition:    Per UR Regulation:    Comments:  07/26/11 1400 Aarin Bluett Leanord Hawking RN BSN CM

## 2011-07-26 NOTE — Progress Notes (Signed)
TB skin test negative at  0 mm.  Schonewitz, Candelaria Stagers 07/26/2011

## 2011-07-26 NOTE — Progress Notes (Signed)
ANTIBIOTIC CONSULT NOTE   Pharmacy Consult for Vancomycin Indication: pneumonia  No Known Allergies  Patient Measurements: Height: 5\' 8"  (172.7 cm) Weight: 203 lb 4.2 oz (92.2 kg) IBW/kg (Calculated) : 63.9  Adjusted Body Weight:   Vital Signs: Temp: 97.6 F (36.4 C) (03/04 0652) Temp src: Oral (03/04 0652) BP: 135/84 mmHg (03/04 0652) Pulse Rate: 65  (03/04 0652) Intake/Output from previous day: 03/03 0701 - 03/04 0700 In: 3073.3 [P.O.:560; I.V.:1613.3; IV Piggyback:900] Out: 800 [Urine:800] Intake/Output from this shift:    Labs: RECENT VANCOMYCIN TROUGH LEVELS: Recent Labs  Basename 07/26/11 0536   VANCOTROUGH 10.4     Basename 07/26/11 0536 07/25/11 0357 07/24/11 0503  WBC 9.9 12.7* 11.3*  HGB 10.9* 11.4* 10.2*  PLT 386 395 338  LABCREA -- -- --  CREATININE 0.71 0.73 0.72   Estimated Creatinine Clearance: 91 ml/min (by C-G formula based on Cr of 0.71).    Microbiology: Recent Results (from the past 720 hour(s))  CULTURE, BLOOD (ROUTINE X 2)     Status: Normal (Preliminary result)   Collection Time   07/24/11  3:38 AM      Component Value Range Status Comment   Specimen Description Blood RIGHT ANTECUBITAL   Final    Special Requests BOTTLES DRAWN AEROBIC AND ANAEROBIC 6CC   Final    Culture NO GROWTH 1 DAY   Final    Report Status PENDING   Incomplete   CULTURE, BLOOD (ROUTINE X 2)     Status: Normal (Preliminary result)   Collection Time   07/24/11  3:41 AM      Component Value Range Status Comment   Specimen Description Blood BLOOD RIGHT WRIST   Final    Special Requests BOTTLES DRAWN AEROBIC AND ANAEROBIC 6CC   Final    Culture NO GROWTH 1 DAY   Final    Report Status PENDING   Incomplete   MRSA PCR SCREENING     Status: Normal   Collection Time   07/24/11  4:24 AM      Component Value Range Status Comment   MRSA by PCR NEGATIVE  NEGATIVE  Final   CULTURE, SPUTUM-ASSESSMENT     Status: Normal   Collection Time   07/24/11  4:56 AM      Component  Value Range Status Comment   Specimen Description SPUTUM   Final    Special Requests NONE   Final    Sputum evaluation     Final    Value: THIS SPECIMEN IS ACCEPTABLE. RESPIRATORY CULTURE REPORT TO FOLLOW.   Report Status 07/24/2011 FINAL   Final   AFB CULTURE WITH SMEAR     Status: Normal (Preliminary result)   Collection Time   07/24/11  4:56 AM      Component Value Range Status Comment   Specimen Description SPUTUM   Final    Special Requests NONE   Final    ACID FAST SMEAR NO ACID FAST BACILLI SEEN   Final    Culture     Final    Value: CULTURE WILL BE EXAMINED FOR 6 WEEKS BEFORE ISSUING A FINAL REPORT   Report Status PENDING   Incomplete   CULTURE, RESPIRATORY     Status: Normal (Preliminary result)   Collection Time   07/24/11  4:56 AM      Component Value Range Status Comment   Specimen Description SPUTUM   Final    Special Requests NONE   Final    Gram Stain PENDING  Incomplete    Culture Culture reincubated for better growth   Final    Report Status PENDING   Incomplete   URINE CULTURE     Status: Normal   Collection Time   07/24/11  5:55 AM      Component Value Range Status Comment   Specimen Description URINE, CLEAN CATCH   Final    Special Requests Normal   Final    Culture  Setup Time [id number]   Final    Colony Count NO GROWTH   Final    Culture NO GROWTH   Final    Report Status 07/25/2011 FINAL   Final   AFB CULTURE WITH SMEAR     Status: Normal (Preliminary result)   Collection Time   07/24/11  2:07 PM      Component Value Range Status Comment   Specimen Description SPUTUM   Final    Special Requests NONE   Final    ACID FAST SMEAR NO ACID FAST BACILLI SEEN   Final    Culture     Final    Value: CULTURE WILL BE EXAMINED FOR 6 WEEKS BEFORE ISSUING A FINAL REPORT   Report Status PENDING   Incomplete    Medical History: Past Medical History  Diagnosis Date  . Anxiety   . Insomnia   . Depression   . Back pain   . Migraines   . Diverticulitis     Medications:  Scheduled:     . albuterol  2 puff Inhalation QID  . ALPRAZolam  1 mg Oral QID  . benzonatate  200 mg Oral TID  . chlorpheniramine-HYDROcodone  5 mL Oral Q12H  . feeding supplement  1 Container Oral TID WC  . levofloxacin (LEVAQUIN) IV  750 mg Intravenous Q24H  . mulitivitamin with minerals  1 tablet Oral Daily  . nicotine  14 mg Transdermal Daily  . traZODone  150 mg Oral QHS  . vancomycin  1,250 mg Intravenous Q12H  . DISCONTD: vancomycin  1,000 mg Intravenous Q12H   Assessment: Empiric therapy. Also on Levofloxacin. Vancomycin trough level below goal  Goal of Therapy:  Vancomycin trough level 15-20 mcg/ml  Plan:  Increase Vancomycin to 1250 mg IV every 12 hours. Labs per protocol Re-assess trough at steady [address], [place] 07/26/2011,8:22 AM

## 2011-07-27 LAB — CULTURE, RESPIRATORY W GRAM STAIN

## 2011-07-27 MED ORDER — HYDROCODONE-ACETAMINOPHEN 5-500 MG PO TABS: 1.0000 | ORAL_TABLET | Freq: Four times a day (QID) | ORAL | Status: DC | PRN

## 2011-07-27 MED ORDER — SODIUM CHLORIDE 0.9 % IJ SOLN
INTRAMUSCULAR | Status: AC
  Administered 2011-07-27: 3 mL
  Filled 2011-07-27: qty 3

## 2011-07-27 MED ORDER — HYDROCOD POLST-CHLORPHEN POLST 10-8 MG/5ML PO LQCR: 5.0000 mL | Freq: Two times a day (BID) | ORAL | Status: DC

## 2011-07-27 MED ORDER — SODIUM CHLORIDE 0.9 % IJ SOLN
INTRAMUSCULAR | Status: AC
  Administered 2011-07-27: 08:00:00
  Filled 2011-07-27: qty 6

## 2011-07-27 MED ORDER — BENZONATATE 200 MG PO CAPS: 200.0000 mg | ORAL_CAPSULE | Freq: Three times a day (TID) | ORAL | Status: AC

## 2011-07-27 MED ORDER — ALBUTEROL SULFATE HFA 108 (90 BASE) MCG/ACT IN AERS: 2.0000 | INHALATION_SPRAY | RESPIRATORY_TRACT | Status: DC | PRN

## 2011-07-27 MED ORDER — SULFAMETHOXAZOLE-TRIMETHOPRIM 800-160 MG PO TABS: 1.0000 | ORAL_TABLET | Freq: Two times a day (BID) | ORAL | Status: AC

## 2011-07-27 NOTE — Discharge Summary (Signed)
Physician Discharge Summary  Patient ID: Wendy Arnold MRN: [id number] DOB/AGE: [birth year] 61 y.o.  Admit date: 07/23/2011 Discharge date: 07/27/2011  Primary Care Physician:  Josue Hector, MD, MD   Discharge Diagnoses:    1. Acute Pyelonephritis 2. Emphysema 3. Chronic cough with hemoptysis, resolved, possibly related to chronic bronchitis 4. Dehydration 5. Chronic pain 6. Anxiety 7. Anemia, mild, for outpatient follow up   Medication List  As of 07/27/2011  1:44 PM   STOP taking these medications         acetaminophen 325 MG tablet         TAKE these medications         ALPRAZolam 1 MG tablet   Commonly known as: XANAX   Take 1 mg by mouth 4 (four) times daily.      aspirin EC 81 MG tablet   Take 162 mg by mouth daily.      benzonatate 200 MG capsule   Commonly known as: TESSALON   Take 1 capsule (200 mg total) by mouth 3 (three) times daily.      BIOFREEZE EX   Apply 1 application topically as needed. For pain      chlorpheniramine-HYDROcodone 10-8 MG/5ML Lqcr   Commonly known as: TUSSIONEX   Take 5 mLs by mouth every 12 (twelve) hours.      guaiFENesin-codeine 100-10 MG/5ML syrup   Commonly known as: ROBITUSSIN AC   Take 5 mLs by mouth 3 (three) times daily as needed. For cough      HYDROcodone-acetaminophen 5-500 MG per tablet   Commonly known as: VICODIN   Take 1 tablet by mouth every 6 (six) hours as needed. For pain      meloxicam 7.5 MG tablet   Commonly known as: MOBIC   Take 7.5 mg by mouth daily.      sulfamethoxazole-trimethoprim 800-160 MG per tablet   Commonly known as: BACTRIM DS,SEPTRA DS   Take 1 tablet by mouth 2 (two) times daily.      traZODone 150 MG tablet   Commonly known as: DESYREL   Take 150 mg by mouth at bedtime.           Discharge Exam: Feeling better, cough better Blood pressure 152/77, pulse 68, temperature 98.4 F (36.9 C), temperature source Oral, resp. rate 19, height 5\' 8"  (1.727 m), weight 92.2 kg  (203 lb 4.2 oz), SpO2 97.00%. NAD CTA B S1, S2, RRR Soft, NT, BS+ No edema b/l  Disposition and Follow-up:  Follow up with Dr. Lysbeth Galas in 2 weeks  Consults:  none   Significant Diagnostic Studies:  Ct Chest W Contrast  07/24/2011  *RADIOLOGY REPORT*  Clinical Data:  61 year old female with chest, abdominal and pelvic pain. Cough and fever.  CT CHEST, ABDOMEN AND PELVIS WITH CONTRAST  Technique:  Multidetector CT imaging of the chest, abdomen and pelvis was performed following the standard protocol during bolus administration of intravenous contrast.  Contrast: OMNIPAQUE IOHEXOL 300 MG/ML IJ SOLN  Comparison:  12/21/2005 abdominal/pelvic CT and 07/23/2011 chest radiograph  CT CHEST  Findings:  Mild coronary artery and aortic atherosclerotic calcifications are identified. The heart and great vessels are within normal limits otherwise.  There are no pleural or pericardial effusions. No enlarged or abnormal-appearing lymph nodes are identified.  Mild centrilobular paraseptal emphysema identified. Minimal basilar and right middle lobe/lingular scarring again noted. There is no evidence of airspace disease, consolidation, suspicious nodules/mass or endobronchial/endotracheal lesions.  No acute or suspicious bony abnormalities are identified.  IMPRESSION: No acute abnormalities.  Coronary artery disease and emphysema.  CT ABDOMEN AND PELVIS  Findings:  The liver, spleen, pancreas, gallbladder, adrenal glands and left kidney are unremarkable. There is mild wall thickening of the right renal pelvis with mild perinephric stranding.  Heterogeneity of the renal parenchyma is identified on delayed images.  These findings likely represent infection/pyelonephritis. There is no evidence of renal abscess, hydronephrosis, solid renal mass or urinary calculi.  No free fluid, enlarged lymph nodes, biliary dilation or abdominal aortic aneurysm identified.  Descending and sigmoid colonic diverticulosis noted without  diverticulitis.  No other bowel abnormalities are identified.  No acute or suspicious bony abnormalities are present.  Moderate degenerative disc disease and spondylosis at L5-S1 noted.  IMPRESSION: Right renal changes suspicious for UTI/pyelonephritis.  Correlate clinically.  Colonic diverticulosis without evidence of diverticulitis.  Original Report Authenticated By: Rosendo Gros, M.D.   Ct Abdomen Pelvis W Contrast  07/24/2011  *RADIOLOGY REPORT*  Clinical Data:  61 year old female with chest, abdominal and pelvic pain. Cough and fever.  CT CHEST, ABDOMEN AND PELVIS WITH CONTRAST  Technique:  Multidetector CT imaging of the chest, abdomen and pelvis was performed following the standard protocol during bolus administration of intravenous contrast.  Contrast: OMNIPAQUE IOHEXOL 300 MG/ML IJ SOLN  Comparison:  12/21/2005 abdominal/pelvic CT and 07/23/2011 chest radiograph  CT CHEST  Findings:  Mild coronary artery and aortic atherosclerotic calcifications are identified. The heart and great vessels are within normal limits otherwise.  There are no pleural or pericardial effusions. No enlarged or abnormal-appearing lymph nodes are identified.  Mild centrilobular paraseptal emphysema identified. Minimal basilar and right middle lobe/lingular scarring again noted. There is no evidence of airspace disease, consolidation, suspicious nodules/mass or endobronchial/endotracheal lesions.  No acute or suspicious bony abnormalities are identified.  IMPRESSION: No acute abnormalities.  Coronary artery disease and emphysema.  CT ABDOMEN AND PELVIS  Findings:  The liver, spleen, pancreas, gallbladder, adrenal glands and left kidney are unremarkable. There is mild wall thickening of the right renal pelvis with mild perinephric stranding.  Heterogeneity of the renal parenchyma is identified on delayed images.  These findings likely represent infection/pyelonephritis. There is no evidence of renal abscess, hydronephrosis,  solid renal mass or urinary calculi.  No free fluid, enlarged lymph nodes, biliary dilation or abdominal aortic aneurysm identified.  Descending and sigmoid colonic diverticulosis noted without diverticulitis.  No other bowel abnormalities are identified.  No acute or suspicious bony abnormalities are present.  Moderate degenerative disc disease and spondylosis at L5-S1 noted.  IMPRESSION: Right renal changes suspicious for UTI/pyelonephritis.  Correlate clinically.  Colonic diverticulosis without evidence of diverticulitis.  Original Report Authenticated By: Rosendo Gros, M.D.   Dg Chest Port 1 View  07/24/2011  *RADIOLOGY REPORT*  Clinical Data: Cough and fever.  PORTABLE CHEST - 1 VIEW  Comparison: Chest radiograph performed 07/02/2011  Findings: The lungs are well-aerated.  Mild medial right basilar airspace opacity may reflect atelectasis or possibly pneumonia. There is no evidence of pleural effusion or pneumothorax.  The cardiomediastinal silhouette is within normal limits.  No acute osseous abnormalities are seen.  IMPRESSION: Mild medial right basilar airspace opacity may reflect atelectasis or possibly pneumonia.  Original Report Authenticated By: Tonia Ghent, M.D.    Brief H and P: For complete details please refer to admission H and P, but in brief Ms Mattia is a very sweet lady, who has not had regular health care maintenance due to lack of health insurance. She comes in  with 3 months history of cough productive of alternate green and yellow phlegm, culminating in some scant hemoptysis in the last 2 weeks or so. She has developed high grade fever in the last 1 week. Her voice in now hoarse. She is not sure if she has lost weight or not, but has had to use a fan in the house. In the last week she also had some right flank pain and some dysuria. She also had some vomiting. Appetite has been poor. She denies positive TB contact. Says skin tb test negative in June last year. She has also noticed  some stiff joins. She is an alpha antitrypsin deficiency gene carrier. Patient being treated for Pyelonephritis/CAP in ED.   Hospital Course:  This is a 61 y/o female who was admitted to the hospital for fever, malaise, dehydration and cough.  She was found to have a pyelonephritis on admission and was started on appropriate antibiotics.  She also reported having a significant chronic cough with hemoptysis.  She was placed on respiratory isolation and had 3 negative AFB smears.  Her PPD was negative, and chest xray and CT chest did not show any significant findings other than emphysema.  TB was effectively ruled out and respiratory precautions were discontinued.  Her cough has somewhat improved with anti tussives.  I suspect this is related to a chronic bronchitis with her smoking.  Her pyelonephritis has improved, and she is currently a febrile.  Interestingly her urine culture did not show any growth.  We will complete a total of 7 days of antibiotics and transition to oral bactrim.  The remainder of her chronic medical issues have remained stable.  She will be discharged home today.  Time spent on Discharge:  Signed: Simrit Gohlke [facility] Pager: [phone] 07/27/2011, 1:44 PM

## 2011-07-29 LAB — CULTURE, BLOOD (ROUTINE X 2)
Culture: NO GROWTH
Culture: NO GROWTH

## 2011-09-12 LAB — AFB CULTURE WITH SMEAR (NOT AT ARMC)

## 2011-09-20 LAB — AFB CULTURE WITH SMEAR (NOT AT ARMC): Acid Fast Smear: NONE SEEN

## 2012-02-29 ENCOUNTER — Other Ambulatory Visit (HOSPITAL_COMMUNITY): Payer: Self-pay | Admitting: Nurse Practitioner

## 2012-02-29 DIAGNOSIS — Z139 Encounter for screening, unspecified: Secondary | ICD-10-CM

## 2012-03-03 ENCOUNTER — Ambulatory Visit (HOSPITAL_COMMUNITY): Payer: Self-pay

## 2012-03-06 ENCOUNTER — Ambulatory Visit (HOSPITAL_COMMUNITY)
Admission: RE | Admit: 2012-03-06 | Discharge: 2012-03-06 | Disposition: A | Discharge status: Home / Self Care | Payer: PRIVATE HEALTH INSURANCE | Source: Ambulatory Visit | Attending: Nurse Practitioner | Admitting: Nurse Practitioner

## 2012-03-06 DIAGNOSIS — Z139 Encounter for screening, unspecified: Secondary | ICD-10-CM

## 2012-03-06 DIAGNOSIS — Z1231 Encounter for screening mammogram for malignant neoplasm of breast: Secondary | ICD-10-CM | POA: Insufficient documentation

## 2012-10-27 IMAGING — CT CT CHEST W/ CM
2 of 5 series · 14 of 36 positions shown, 17 images · IV contrast (Omnipaque 300)
Comparison: 12/21/2005 abdominal/pelvic CT and 07/23/2011 chest
radiograph

CT CHEST

CLINICAL DATA: 60-year-old female with chest, abdominal and pelvic
pain. Cough and fever.

CT CHEST, ABDOMEN AND PELVIS WITH CONTRAST
TECHNIQUE: Multidetector CT imaging of the chest, abdomen and
pelvis was performed following the standard protocol during bolus
administration of intravenous contrast.
Contrast: 100mL OMNIPAQUE IOHEXOL 300 MG/ML IJ SOLN

[Series 2: cap with 5.0 b40f · axial · 0.82mm/px · z∈[-637,-52]mm · 11 of 131 slices shown, 14 images]
[im 7/131  mediastinal]
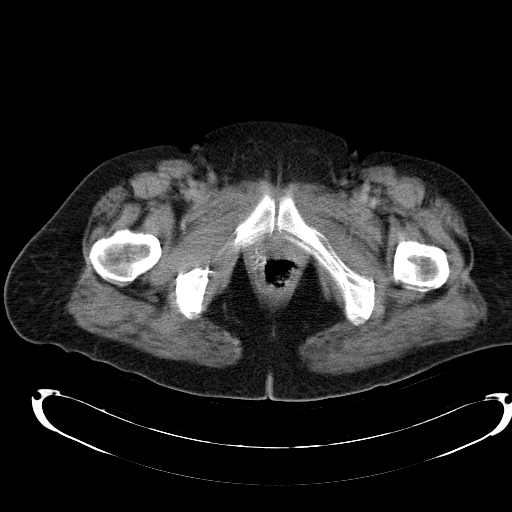
[im 7/131  lung]
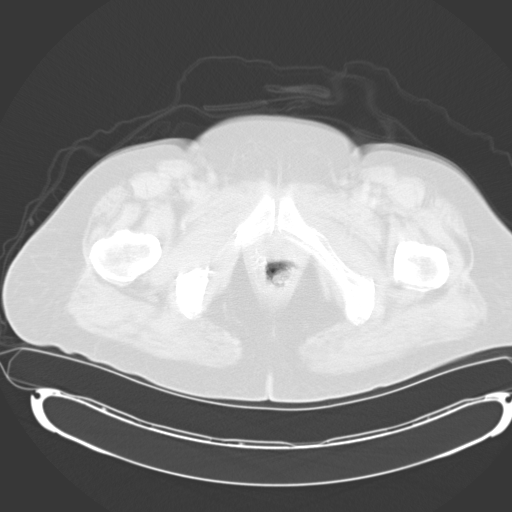
[im 20/131  lung]
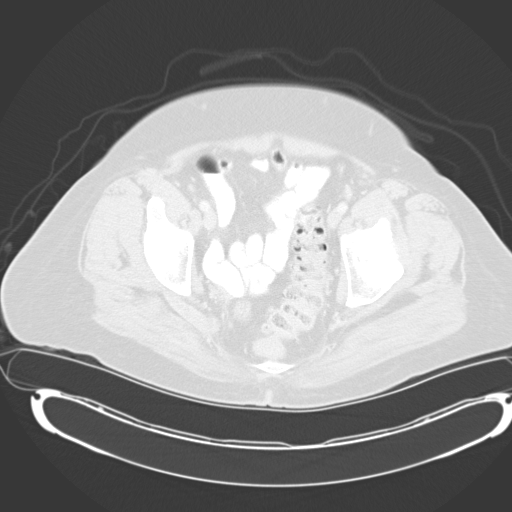
[im 33/131  lung]
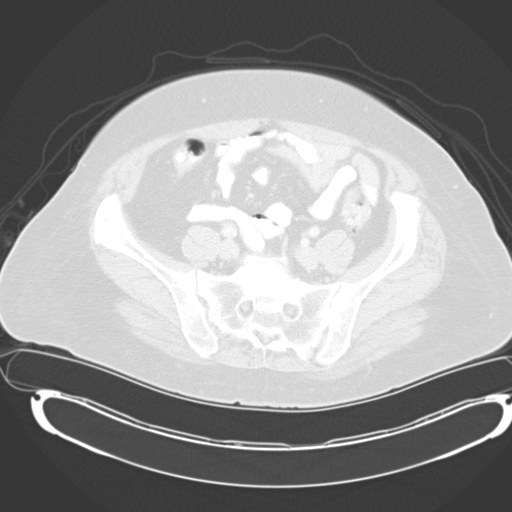
[im 46/131  lung]
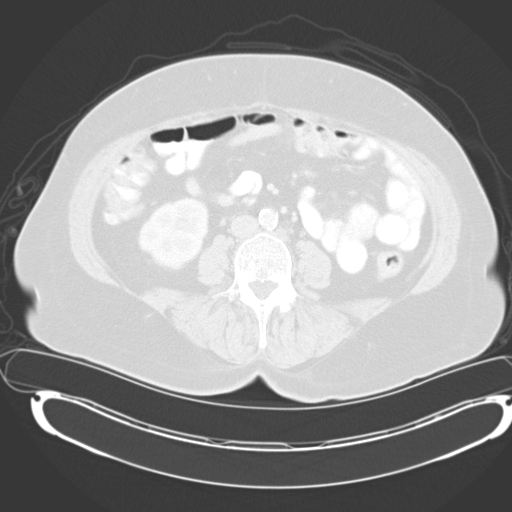
[im 53/131  mediastinal]
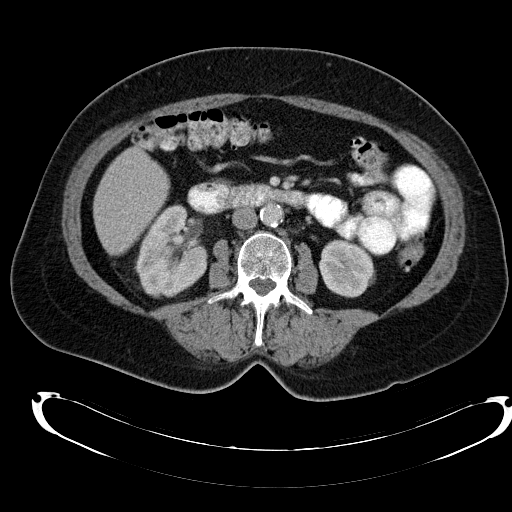
[im 53/131  lung]
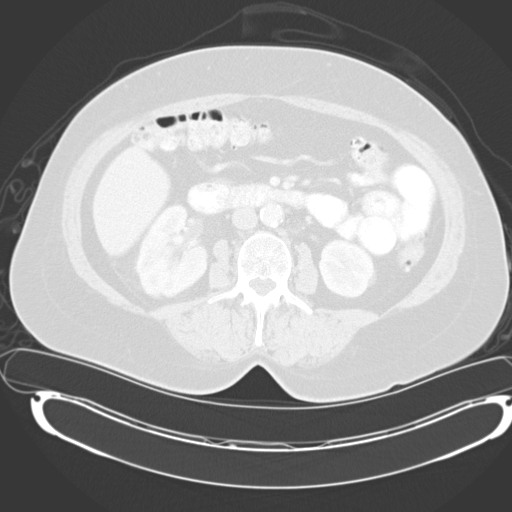
[im 66/131  lung]
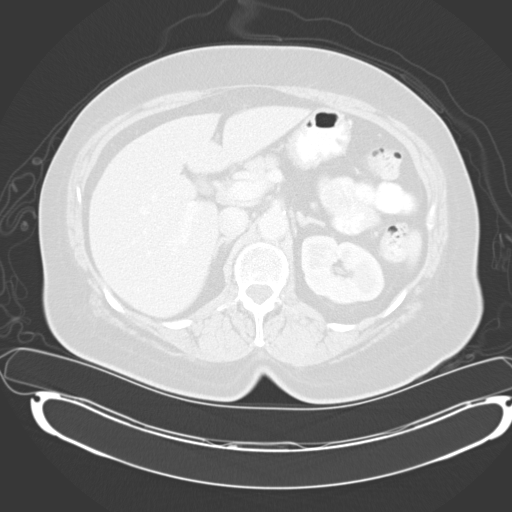
[im 79/131  lung]
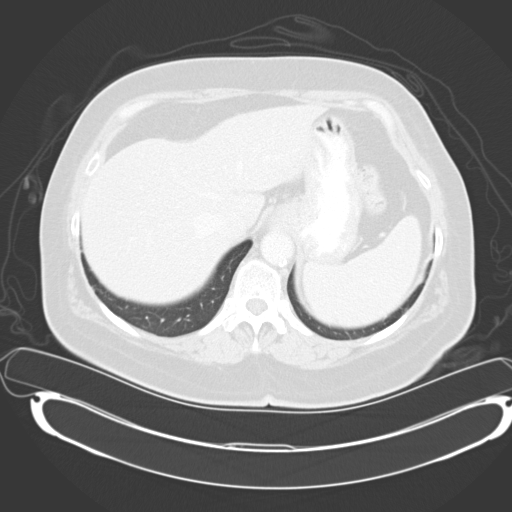
[im 85/131  lung]
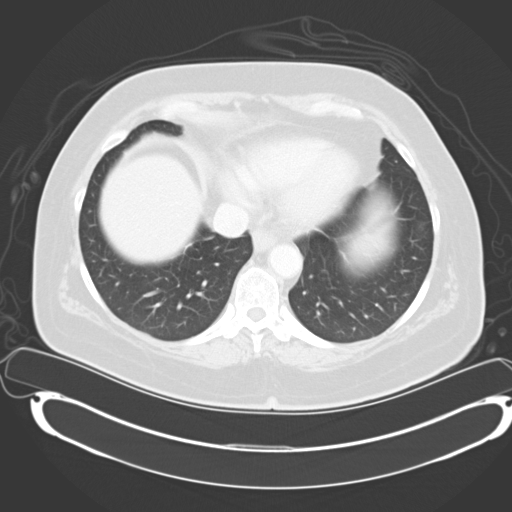
[im 98/131  mediastinal]
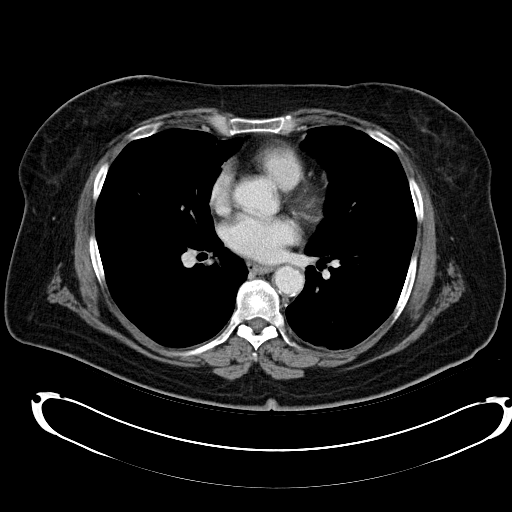
[im 98/131  lung]
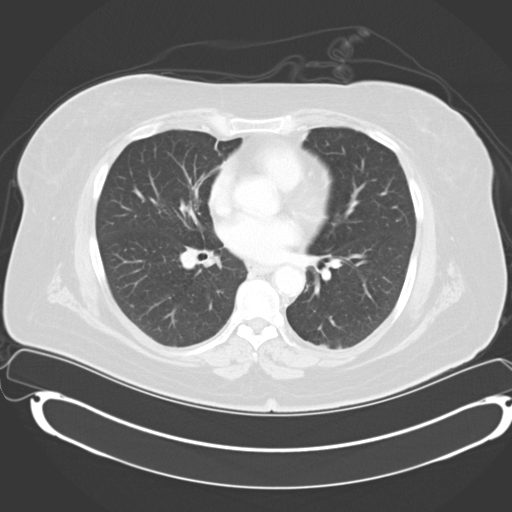
[im 111/131  lung]
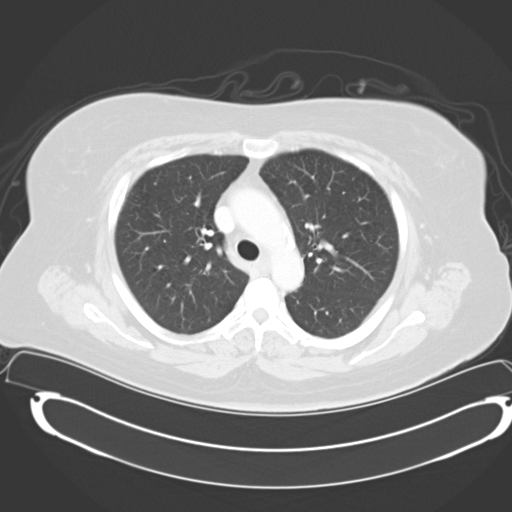
[im 124/131  lung]
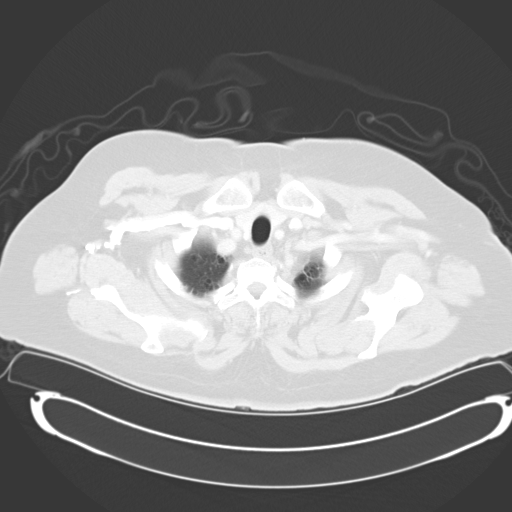

[Series 4: mpr cor post contrast (id) · coronal · 0.78mm/px · 3 of 101 slices shown]
[im 21/101  lung]
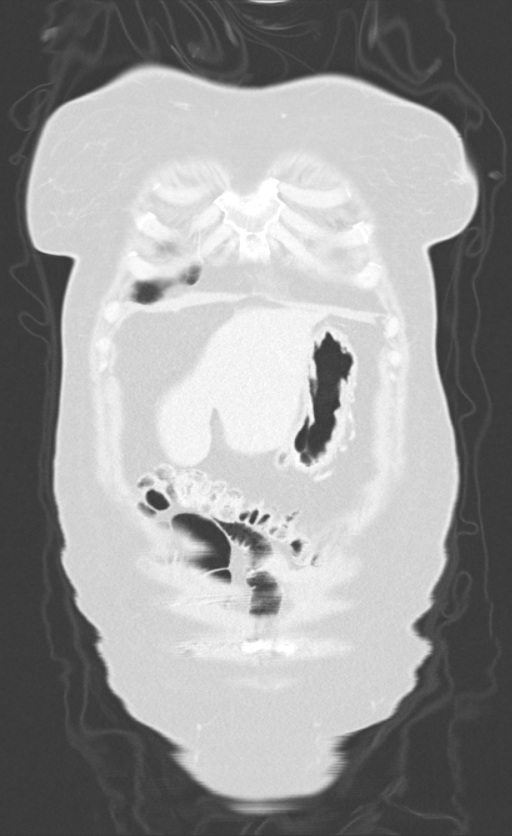
[im 41/101  lung]
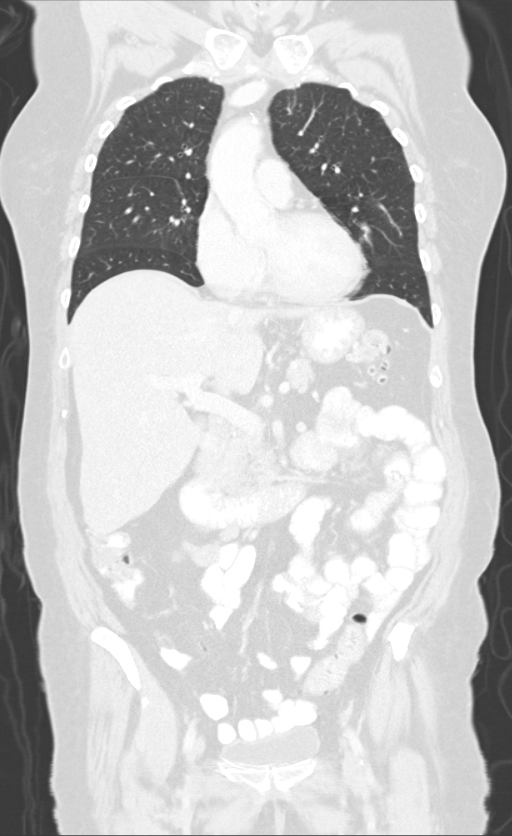
[im 61/101  lung]
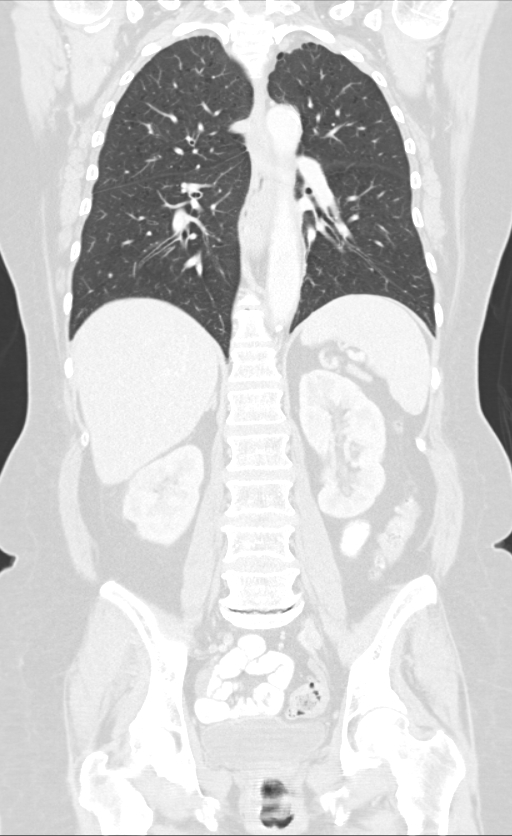

[14 of 36 positions shown; findings below may reference images not displayed]

FINDINGS: Mild coronary artery and aortic atherosclerotic
calcifications are identified.
The heart and great vessels are within normal limits otherwise.

There are no pleural or pericardial effusions.
No enlarged or abnormal-appearing lymph nodes are identified.

Mild centrilobular paraseptal emphysema identified.
Minimal basilar and right middle lobe/lingular scarring again
noted.
There is no evidence of airspace disease, consolidation, suspicious
nodules/mass or endobronchial/endotracheal lesions.

No acute or suspicious bony abnormalities are identified.
IMPRESSION: No acute abnormalities.

Coronary artery disease and emphysema.

CT ABDOMEN AND PELVIS
FINDINGS: The liver, spleen, pancreas, gallbladder, adrenal glands
and left kidney are unremarkable.
There is mild wall thickening of the right renal pelvis with mild
perinephric stranding.  Heterogeneity of the renal parenchyma is
identified on delayed images.  These findings likely represent
infection/pyelonephritis. There is no evidence of renal abscess,
hydronephrosis, solid renal mass or urinary calculi.

No free fluid, enlarged lymph nodes, biliary dilation or abdominal
aortic aneurysm identified.

Descending and sigmoid colonic diverticulosis noted without
diverticulitis.

No other bowel abnormalities are identified.

No acute or suspicious bony abnormalities are present.  Moderate
degenerative disc disease and spondylosis at L5-S1 noted.
IMPRESSION: Right renal changes suspicious for UTI/pyelonephritis.  Correlate
clinically.

Colonic diverticulosis without evidence of diverticulitis.

## 2012-10-29 IMAGING — CR DG CHEST 2V
2 series · 2 of 2 positions shown · non-contrast
Comparison: CT 07/24/2011

CLINICAL DATA: Cough

CHEST - 2 VIEW

[view not recorded (1 of 2)]
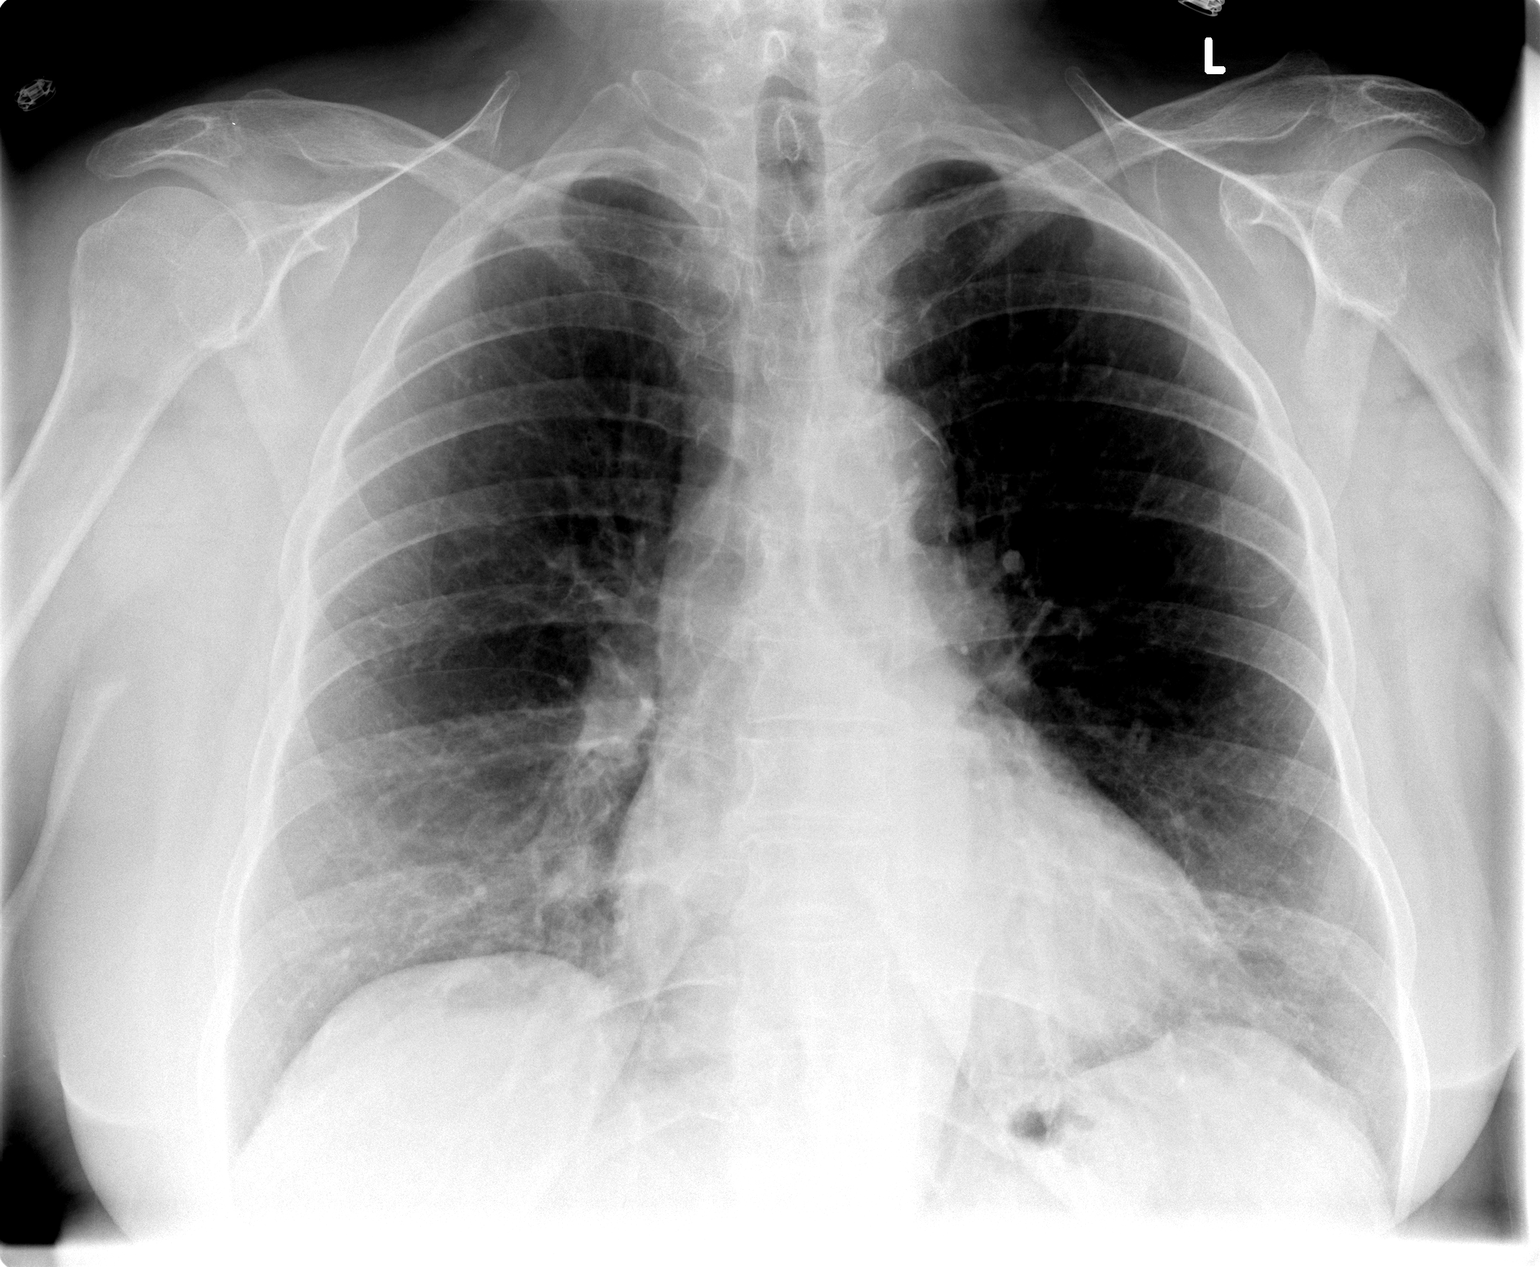

[view not recorded (2 of 2)]
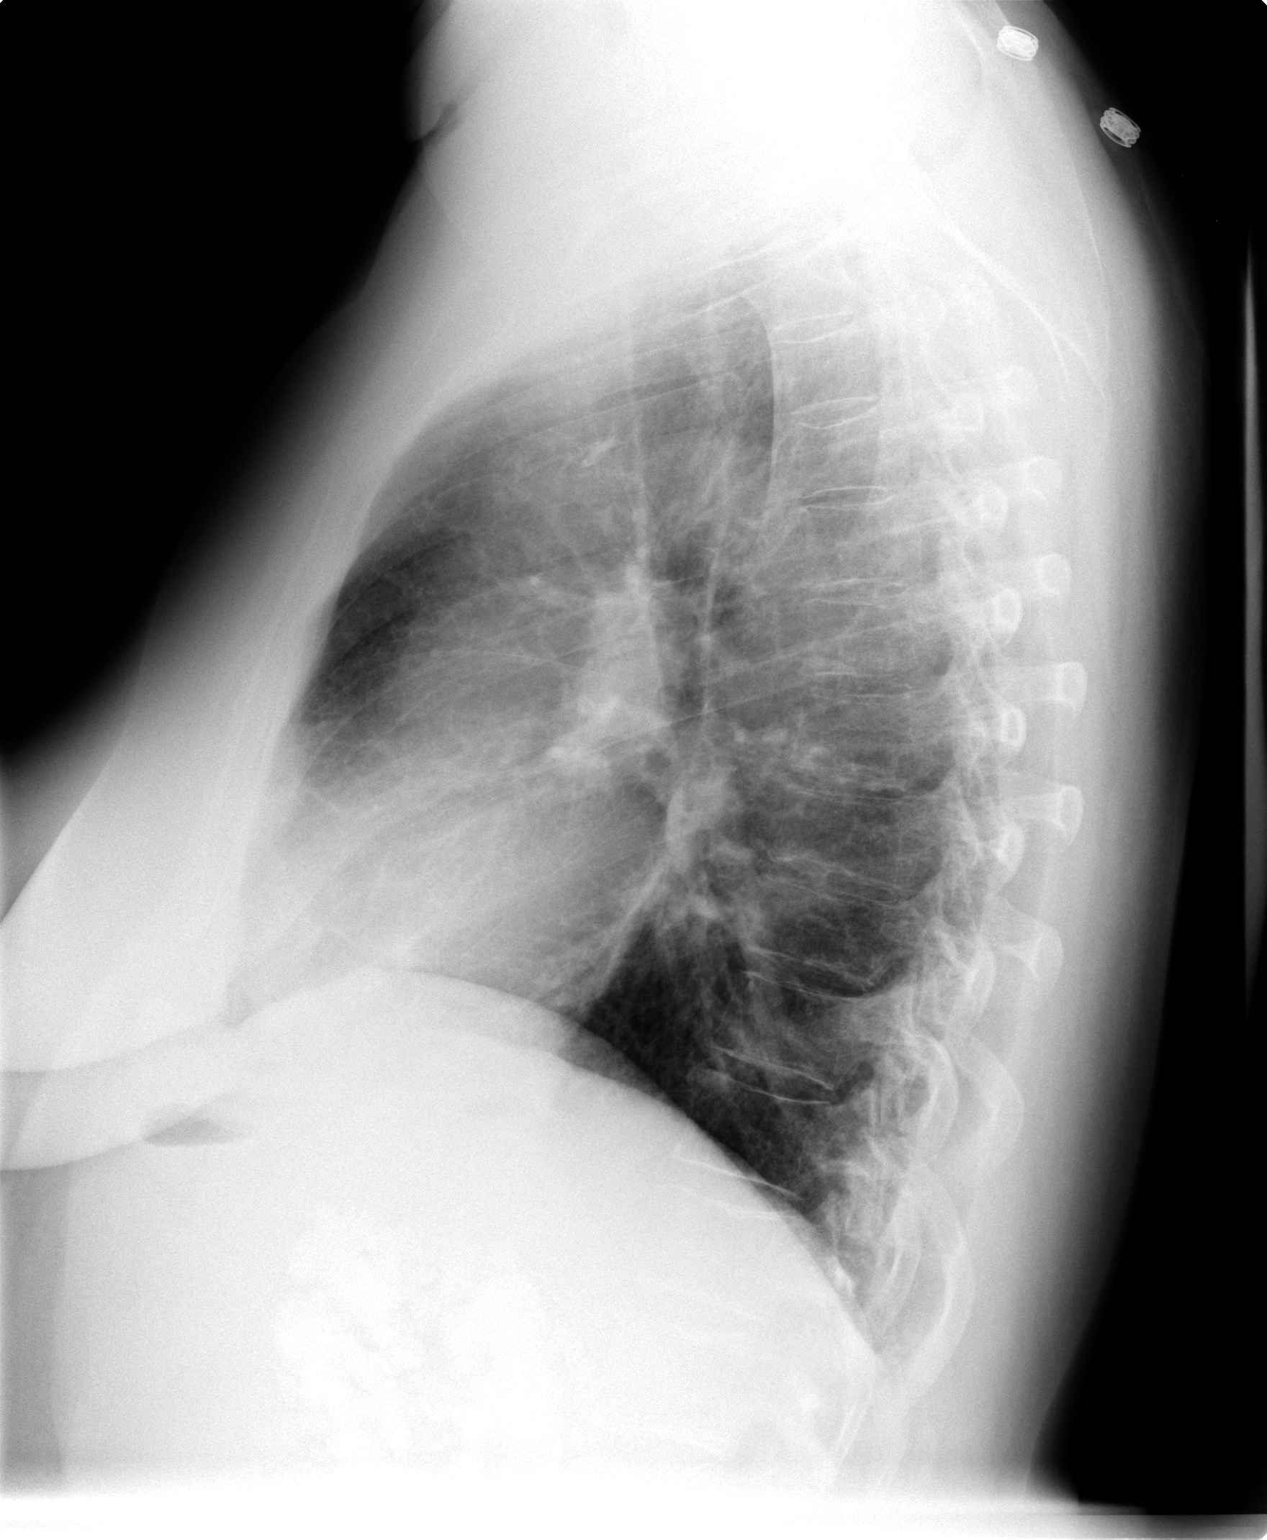

[2 of 2 positions shown; findings below may reference images not displayed]

FINDINGS: Heart size is normal.  There is calcification of the
thoracic aorta.  There are slightly prominent interstitial lung
markings but no evidence of active infiltrate, mass, effusion or
collapse.  No significant bony finding.
IMPRESSION: No active cardiopulmonary disease evident.  Slightly prominent
interstitial lung markings.

## 2013-01-23 ENCOUNTER — Other Ambulatory Visit: Payer: Self-pay | Admitting: Physical Medicine and Rehabilitation

## 2013-01-23 DIAGNOSIS — M549 Dorsalgia, unspecified: Secondary | ICD-10-CM

## 2013-01-23 DIAGNOSIS — M542 Cervicalgia: Secondary | ICD-10-CM

## 2013-01-29 ENCOUNTER — Ambulatory Visit
Admission: RE | Admit: 2013-01-29 | Discharge: 2013-01-29 | Disposition: A | Discharge status: Home / Self Care | Payer: BC Managed Care – PPO | Source: Ambulatory Visit | Attending: Physical Medicine and Rehabilitation | Admitting: Physical Medicine and Rehabilitation

## 2013-01-29 ENCOUNTER — Ambulatory Visit
Admission: RE | Admit: 2013-01-29 | Discharge: 2013-01-29 | Disposition: A | Discharge status: Home / Self Care | Payer: PRIVATE HEALTH INSURANCE | Source: Ambulatory Visit | Attending: Physical Medicine and Rehabilitation | Admitting: Physical Medicine and Rehabilitation

## 2013-01-29 DIAGNOSIS — M542 Cervicalgia: Secondary | ICD-10-CM

## 2013-01-29 DIAGNOSIS — M549 Dorsalgia, unspecified: Secondary | ICD-10-CM

## 2013-01-29 MED ORDER — GADOBENATE DIMEGLUMINE 529 MG/ML IV SOLN
17.0000 mL | Freq: Once | INTRAVENOUS | Status: AC | PRN
  Administered 2013-01-29: 17 mL via INTRAVENOUS

## 2013-07-17 ENCOUNTER — Emergency Department (HOSPITAL_COMMUNITY): Payer: BC Managed Care – PPO

## 2013-07-17 ENCOUNTER — Emergency Department (HOSPITAL_COMMUNITY)
Admission: EM | Admit: 2013-07-17 | Discharge: 2013-07-17 | Disposition: A | Discharge status: Home / Self Care | Payer: BC Managed Care – PPO | Attending: Emergency Medicine | Admitting: Emergency Medicine

## 2013-07-17 ENCOUNTER — Encounter (HOSPITAL_COMMUNITY): Payer: Self-pay | Admitting: Emergency Medicine

## 2013-07-17 DIAGNOSIS — Z9889 Other specified postprocedural states: Secondary | ICD-10-CM | POA: Insufficient documentation

## 2013-07-17 DIAGNOSIS — F17200 Nicotine dependence, unspecified, uncomplicated: Secondary | ICD-10-CM | POA: Insufficient documentation

## 2013-07-17 DIAGNOSIS — Z792 Long term (current) use of antibiotics: Secondary | ICD-10-CM | POA: Insufficient documentation

## 2013-07-17 DIAGNOSIS — Z791 Long term (current) use of non-steroidal anti-inflammatories (NSAID): Secondary | ICD-10-CM | POA: Insufficient documentation

## 2013-07-17 DIAGNOSIS — N289 Disorder of kidney and ureter, unspecified: Secondary | ICD-10-CM

## 2013-07-17 DIAGNOSIS — G4700 Insomnia, unspecified: Secondary | ICD-10-CM | POA: Insufficient documentation

## 2013-07-17 DIAGNOSIS — F3289 Other specified depressive episodes: Secondary | ICD-10-CM | POA: Insufficient documentation

## 2013-07-17 DIAGNOSIS — M549 Dorsalgia, unspecified: Secondary | ICD-10-CM | POA: Insufficient documentation

## 2013-07-17 DIAGNOSIS — F411 Generalized anxiety disorder: Secondary | ICD-10-CM | POA: Insufficient documentation

## 2013-07-17 DIAGNOSIS — Z90710 Acquired absence of both cervix and uterus: Secondary | ICD-10-CM | POA: Insufficient documentation

## 2013-07-17 DIAGNOSIS — K5792 Diverticulitis of intestine, part unspecified, without perforation or abscess without bleeding: Secondary | ICD-10-CM

## 2013-07-17 DIAGNOSIS — R5381 Other malaise: Secondary | ICD-10-CM | POA: Insufficient documentation

## 2013-07-17 DIAGNOSIS — G43909 Migraine, unspecified, not intractable, without status migrainosus: Secondary | ICD-10-CM | POA: Insufficient documentation

## 2013-07-17 DIAGNOSIS — K5732 Diverticulitis of large intestine without perforation or abscess without bleeding: Secondary | ICD-10-CM | POA: Insufficient documentation

## 2013-07-17 DIAGNOSIS — IMO0001 Reserved for inherently not codable concepts without codable children: Secondary | ICD-10-CM | POA: Insufficient documentation

## 2013-07-17 DIAGNOSIS — F329 Major depressive disorder, single episode, unspecified: Secondary | ICD-10-CM | POA: Insufficient documentation

## 2013-07-17 DIAGNOSIS — Z7982 Long term (current) use of aspirin: Secondary | ICD-10-CM | POA: Insufficient documentation

## 2013-07-17 DIAGNOSIS — R5383 Other fatigue: Secondary | ICD-10-CM

## 2013-07-17 DIAGNOSIS — Z79899 Other long term (current) drug therapy: Secondary | ICD-10-CM | POA: Insufficient documentation

## 2013-07-17 LAB — URINALYSIS, ROUTINE W REFLEX MICROSCOPIC
Bilirubin Urine: NEGATIVE
Glucose, UA: NEGATIVE mg/dL
Hgb urine dipstick: NEGATIVE
KETONES UR: NEGATIVE mg/dL
Nitrite: NEGATIVE
Protein, ur: NEGATIVE mg/dL
SPECIFIC GRAVITY, URINE: 1.015 (ref 1.005–1.030)
Urobilinogen, UA: 0.2 mg/dL (ref 0.0–1.0)
pH: 7 (ref 5.0–8.0)

## 2013-07-17 LAB — CBC WITH DIFFERENTIAL/PLATELET
BASOS ABS: 0 10*3/uL (ref 0.0–0.1)
BASOS PCT: 0 % (ref 0–1)
EOS ABS: 0.4 10*3/uL (ref 0.0–0.7)
Eosinophils Relative: 5 % (ref 0–5)
HEMATOCRIT: 32 % — AB (ref 36.0–46.0)
Hemoglobin: 10.7 g/dL — ABNORMAL LOW (ref 12.0–15.0)
Lymphocytes Relative: 24 % (ref 12–46)
Lymphs Abs: 2.2 10*3/uL (ref 0.7–4.0)
MCH: 31.6 pg (ref 26.0–34.0)
MCHC: 33.4 g/dL (ref 30.0–36.0)
MCV: 94.4 fL (ref 78.0–100.0)
MONO ABS: 0.7 10*3/uL (ref 0.1–1.0)
Monocytes Relative: 8 % (ref 3–12)
Neutro Abs: 5.7 10*3/uL (ref 1.7–7.7)
Neutrophils Relative %: 63 % (ref 43–77)
Platelets: 366 10*3/uL (ref 150–400)
RBC: 3.39 MIL/uL — ABNORMAL LOW (ref 3.87–5.11)
RDW: 12.8 % (ref 11.5–15.5)
WBC: 9 10*3/uL (ref 4.0–10.5)

## 2013-07-17 LAB — COMPREHENSIVE METABOLIC PANEL
ALBUMIN: 3.6 g/dL (ref 3.5–5.2)
ALT: 17 U/L (ref 0–35)
AST: 17 U/L (ref 0–37)
Alkaline Phosphatase: 68 U/L (ref 39–117)
BUN: 23 mg/dL (ref 6–23)
CALCIUM: 9.6 mg/dL (ref 8.4–10.5)
CO2: 26 mEq/L (ref 19–32)
CREATININE: 1.24 mg/dL — AB (ref 0.50–1.10)
Chloride: 100 mEq/L (ref 96–112)
GFR calc Af Amer: 54 mL/min — ABNORMAL LOW (ref >90)
GFR calc non Af Amer: 46 mL/min — ABNORMAL LOW (ref >90)
Glucose, Bld: 111 mg/dL — ABNORMAL HIGH (ref 70–99)
Potassium: 4.4 mEq/L (ref 3.7–5.3)
Sodium: 138 mEq/L (ref 137–147)
TOTAL PROTEIN: 7.7 g/dL (ref 6.0–8.3)

## 2013-07-17 LAB — URINE MICROSCOPIC-ADD ON

## 2013-07-17 LAB — LACTIC ACID, PLASMA: Lactic Acid, Venous: 0.9 mmol/L (ref 0.5–2.2)

## 2013-07-17 LAB — POC OCCULT BLOOD, ED: Fecal Occult Bld: NEGATIVE

## 2013-07-17 LAB — LIPASE, BLOOD: LIPASE: 37 U/L (ref 11–59)

## 2013-07-17 MED ORDER — AMOXICILLIN-POT CLAVULANATE 875-125 MG PO TABS: 1.0000 | ORAL_TABLET | Freq: Two times a day (BID) | ORAL | Status: DC

## 2013-07-17 MED ORDER — ONDANSETRON HCL 4 MG PO TABS: 4.0000 mg | ORAL_TABLET | Freq: Four times a day (QID) | ORAL | Status: DC

## 2013-07-17 MED ORDER — SODIUM CHLORIDE 0.9 % IV BOLUS (SEPSIS)
1000.0000 mL | Freq: Once | INTRAVENOUS | Status: AC
Start: 2013-07-17 — End: 2013-07-17
  Administered 2013-07-17: 1000 mL via INTRAVENOUS

## 2013-07-17 MED ORDER — IOHEXOL 300 MG/ML  SOLN
50.0000 mL | Freq: Once | INTRAMUSCULAR | Status: AC | PRN
  Administered 2013-07-17: 50 mL via ORAL

## 2013-07-17 MED ORDER — HYDROCODONE-ACETAMINOPHEN 5-325 MG PO TABS
2.0000 | ORAL_TABLET | Freq: Once | ORAL | Status: AC
  Administered 2013-07-17: 2 via ORAL
  Filled 2013-07-17: qty 2

## 2013-07-17 MED ORDER — PIPERACILLIN-TAZOBACTAM 3.375 G IVPB
3.3750 g | Freq: Once | INTRAVENOUS | Status: AC
  Administered 2013-07-17: 3.375 g via INTRAVENOUS
  Filled 2013-07-17: qty 50

## 2013-07-17 MED ORDER — HYDROCODONE-ACETAMINOPHEN 5-325 MG PO TABS: 2.0000 | ORAL_TABLET | ORAL | Status: DC | PRN

## 2013-07-17 MED ORDER — MORPHINE SULFATE 4 MG/ML IJ SOLN
4.0000 mg | Freq: Once | INTRAMUSCULAR | Status: AC
  Administered 2013-07-17: 4 mg via INTRAVENOUS
  Filled 2013-07-17: qty 1

## 2013-07-17 MED ORDER — ONDANSETRON HCL 4 MG/2ML IJ SOLN
4.0000 mg | Freq: Once | INTRAMUSCULAR | Status: AC
  Administered 2013-07-17: 4 mg via INTRAVENOUS
  Filled 2013-07-17: qty 2

## 2013-07-17 NOTE — ED Notes (Signed)
Pt states ongoing abdominal pain x 2 1/2 weeks. States she has been seen and was told she has diverticulitis. States she was put on Cipro and Flagyl but had a "bad reaction" to the flagyl. States last episode of vomiting was two weeks ago. NAD>

## 2013-07-17 NOTE — ED Notes (Signed)
abd pain,, back pain, nausea, had vomiting ,but has decreased.   Had rectal bleeding but that has improved. On antibiotic for diverticulits. Pt had appt  With MD but cancelled  Today. Due to weather.

## 2013-07-17 NOTE — ED Notes (Signed)
Patient given a sprite per MD approval. 

## 2013-07-17 NOTE — ED Provider Notes (Signed)
CSN: [id number]     Arrival date & time 07/17/13  1447 History   First MD Initiated Contact with Patient 07/17/13 1510     Chief Complaint  Patient presents with  . Abdominal Pain     (Consider location/radiation/quality/duration/timing/severity/associated sxs/prior Treatment) HPI Comments: Patient presents with two weeks of persistent and worsening lower abdominal pain.  She was diagnosed with diverticulitis and put on cipro and flagyl by her PCP two weeks. She's been on Cipro for one week. She stopped Flagyl after 5 doses because it made her feel sick. She has not had any vomiting for the past 3 days. Her stools have been alternating between normal stool and diarrhea sometimes with blood. She denies any dizziness or lightheadedness. She denies any shortness of breath or chest pain. She endorses poor appetite and by mouth intake over the past several days. The pain is in her lower abdomen and is now spreading to the right side. She denies any previous abdominal surgeries.  The history is provided by the patient and a relative.    Past Medical History  Diagnosis Date  . Anxiety   . Insomnia   . Depression   . Back pain   . Migraines   . Diverticulitis    Past Surgical History  Procedure Laterality Date  . Back surgery    . Sinus endo with fusion    . Abdominal hysterectomy    . Toe surgery     Family History  Problem Relation Age of Onset  . Heart failure Mother   . Cancer Father   . Cancer Brother   . Cancer Brother   . Cancer Brother    History  Substance Use Topics  . Smoking status: Current Every Day Smoker -- 0.50 packs/day for 40 years    Types: Cigarettes  . Smokeless tobacco: Never Used  . Alcohol Use: Yes     Comment: rarely   OB History   Grav Para Term Preterm Abortions TAB SAB Ect Mult Living   5 1  1 4     1      Review of Systems  Constitutional: Positive for activity change, appetite change and fatigue. Negative for fever.  HENT: Negative for  congestion and rhinorrhea.   Respiratory: Negative for cough, chest tightness and shortness of breath.   Cardiovascular: Negative for chest pain.  Gastrointestinal: Positive for nausea, vomiting, abdominal pain and blood in stool.  Genitourinary: Negative for dysuria, hematuria, vaginal bleeding and vaginal discharge.  Musculoskeletal: Positive for arthralgias, back pain and myalgias.  Skin: Negative for rash.  Neurological: Negative for dizziness, weakness and headaches.  A complete 10 system review of systems was obtained and all systems are negative except as noted in the HPI and PMH.      Allergies  Flagyl  Home Medications   Current Outpatient Rx  Name  Route  Sig  Dispense  Refill  . ALPRAZolam (XANAX) 1 MG tablet   Oral   Take 1 mg by mouth 3 (three) times daily.          Marland Kitchen aspirin EC 81 MG tablet   Oral   Take 162 mg by mouth daily.         . ciprofloxacin (CIPRO) 500 MG tablet   Oral   Take 1 tablet by mouth 2 (two) times daily. Starting 07/09/2013 x 14 days.         . diclofenac (VOLTAREN) 75 MG EC tablet   Oral   Take 1 tablet  by mouth 2 (two) times daily.         Marland Kitchen. HYDROcodone-acetaminophen (NORCO) 10-325 MG per tablet   Oral   Take 1 tablet by mouth every 4 (four) hours as needed.         Marland Kitchen. lisinopril-hydrochlorothiazide (PRINZIDE,ZESTORETIC) 10-12.5 MG per tablet   Oral   Take 1 tablet by mouth daily.         . Menthol, Topical Analgesic, (BIOFREEZE EX)   Apply externally   Apply 1 application topically as needed. For pain          . simvastatin (ZOCOR) 40 MG tablet   Oral   Take 1 tablet by mouth every evening.         . traZODone (DESYREL) 150 MG tablet   Oral   Take 150 mg by mouth at bedtime.           Marland Kitchen. amoxicillin-clavulanate (AUGMENTIN) 875-125 MG per tablet   Oral   Take 1 tablet by mouth every 12 (twelve) hours.   20 tablet   0   . HYDROcodone-acetaminophen (NORCO/VICODIN) 5-325 MG per tablet   Oral   Take 2  tablets by mouth every 4 (four) hours as needed.   10 tablet   0   . ondansetron (ZOFRAN) 4 MG tablet   Oral   Take 1 tablet (4 mg total) by mouth every 6 (six) hours.   12 tablet   0    BP 145/59  Pulse 67  Temp(Src) 98.6 F (37 C) (Oral)  Resp 18  Ht 5\' 8"  (1.727 m)  Wt 185 lb (83.915 kg)  BMI 28.14 kg/m2  SpO2 98% Physical Exam  Constitutional: She is oriented to person, place, and time. She appears well-developed and well-nourished. No distress.  HENT:  Head: Normocephalic and atraumatic.  Mouth/Throat: Oropharynx is clear and moist. No oropharyngeal exudate.  Eyes: Conjunctivae and EOM are normal. Pupils are equal, round, and reactive to light.  Neck: Normal range of motion. Neck supple.  Cardiovascular: Normal rate, regular rhythm and normal heart sounds.   No murmur heard. Pulmonary/Chest: Effort normal and breath sounds normal.  Abdominal: Soft. Bowel sounds are normal. There is tenderness. There is no rebound and no guarding.  TTP LLQ and RLQ with voluntary guarding.   Genitourinary: Guaiac negative stool.  No hemorroids, no fissures  Musculoskeletal: She exhibits no edema and no tenderness.  5/5 strength in bilateral lower extremities. Ankle plantar and dorsiflexion intact. Great toe extension intact bilaterally. +2 DP and PT pulses. +2 patellar reflexes bilaterally. Normal gait.   Neurological: She is alert and oriented to person, place, and time. No cranial nerve deficit. She exhibits normal muscle tone. Coordination normal.  Skin: Skin is warm.    ED Course  Procedures (including critical care time) Labs Review Labs Reviewed  CBC WITH DIFFERENTIAL - Abnormal; Notable for the following:    RBC 3.39 (*)    Hemoglobin 10.7 (*)    HCT 32.0 (*)    All other components within normal limits  COMPREHENSIVE METABOLIC PANEL - Abnormal; Notable for the following:    Glucose, Bld 111 (*)    Creatinine, Ser 1.24 (*)    Total Bilirubin <0.2 (*)    GFR calc non Af  Amer 46 (*)    GFR calc Af Amer 54 (*)    All other components within normal limits  URINALYSIS, ROUTINE W REFLEX MICROSCOPIC - Abnormal; Notable for the following:    Leukocytes, UA SMALL (*)  All other components within normal limits  URINE MICROSCOPIC-ADD ON - Abnormal; Notable for the following:    Squamous Epithelial / LPF FEW (*)    Bacteria, UA FEW (*)    All other components within normal limits  LIPASE, BLOOD  LACTIC ACID, PLASMA  POC OCCULT BLOOD, ED   Imaging Review Ct Abdomen Pelvis Wo Contrast  07/17/2013   CLINICAL DATA:  Abdominal pain with nausea and vomiting.  EXAM: CT ABDOMEN AND PELVIS WITHOUT CONTRAST  TECHNIQUE: Multidetector CT imaging of the abdomen and pelvis was performed following the standard protocol without intravenous contrast.  COMPARISON:  DG LUMBAR SPINE COMPLETE W/ BEND 6+V dated 02/27/2013  FINDINGS: Aortoiliac atherosclerotic vascular disease. Small retroperitoneal lymph nodes are not pathologically enlarged by size criteria. Gastrohepatic ligament node 0.7 cm in short axis.  The noncontrast CT appearance of the liver, spleen, pancreas, and adrenal glands is within normal limits. No specific gallbladder or biliary abnormality identified. Kidneys and proximal ureters unremarkable.  Appendix normal. Transverse, descending, and sigmoid colon diverticulosis with is is low-level inflammatory stranding in the mesentery adjacent to the junction of the descending and sigmoid colon favoring minimal diverticulitis.  No dilated bowel.  Uterus absent.  Urinary bladder unremarkable.  Loss of disc height noted at L5-S1 with facet spurring causing bilateral foraminal stenosis. Facet spurring also causes left foraminal stenosis at L4-5.  IMPRESSION: 1. Minimal diverticulitis of the junction of the descending and sigmoid colon. Distal colonic diverticulosis. 2. Atherosclerosis. 3. Lower lumbar spondylosis and degenerative disc disease with foraminal impingement at L4-5 and L5-S1.    Electronically Signed   By: Herbie Baltimore M.D.   On: 07/17/2013 17:26    EKG Interpretation   None       MDM   Final diagnoses:  Diverticulitis  Renal insufficiency   2 weeks of ongoing abdominal pain with recent treatment for diverticulitis. No fevers or chills. Abdomen soft with lower tenderness.  Hemoccult-negative. Hemoglobin stable. Creatinine mildly elevated at 1.2. Lactate normal.  CT shows minimal diverticulitis without abscess or perforation in the descending and sigmoid colon. Patient is taking by mouth well in the ED. No vomiting or diarrhea.  Will switch antibiotics to Augmentin that she could not tolerate Flagyl. Follow up with PCP this week for recheck of kidney function.  BP 145/59  Pulse 67  Temp(Src) 98.6 F (37 C) (Oral)  Resp 18  Ht 5\' 8"  (1.727 m)  Wt 185 lb (83.915 kg)  BMI 28.14 kg/m2  SpO2 98%   Glynn Octave, MD 07/17/13 2104

## 2013-07-17 NOTE — Discharge Instructions (Signed)
Diverticulitis Stop taking cipro and flagyl. Take augmentin instead. Follow up with Dr. Lysbeth Galas this week. Return to the ED if you develop new or worsening symptoms. A diverticulum is a small pouch or sac on the colon. Diverticulosis is the presence of these diverticula on the colon. Diverticulitis is the irritation (inflammation) or infection of diverticula. CAUSES  The colon and its diverticula contain bacteria. If food particles block the tiny opening to a diverticulum, the bacteria inside can grow and cause an increase in pressure. This leads to infection and inflammation and is called diverticulitis. SYMPTOMS   Abdominal pain and tenderness. Usually, the pain is located on the left side of your abdomen. However, it could be located elsewhere.  Fever.  Bloating.  Feeling sick to your stomach (nausea).  Throwing up (vomiting).  Abnormal stools. DIAGNOSIS  Your caregiver will take a history and perform a physical exam. Since many things can cause abdominal pain, other tests may be necessary. Tests may include:  Blood tests.  Urine tests.  X-ray of the abdomen.  CT scan of the abdomen. Sometimes, surgery is needed to determine if diverticulitis or other conditions are causing your symptoms. TREATMENT  Most of the time, you can be treated without surgery. Treatment includes:  Resting the bowels by only having liquids for a few days. As you improve, you will need to eat a low-fiber diet.  Intravenous (IV) fluids if you are losing body fluids (dehydrated).  Antibiotic medicines that treat infections may be given.  Pain and nausea medicine, if needed.  Surgery if the inflamed diverticulum has burst. HOME CARE INSTRUCTIONS   Try a clear liquid diet (broth, tea, or water for as long as directed by your caregiver). You may then gradually begin a low-fiber diet as tolerated.  A low-fiber diet is a diet with less than 10 grams of fiber. Choose the foods below to reduce fiber in the  diet:  White breads, cereals, rice, and pasta.  Cooked fruits and vegetables or soft fresh fruits and vegetables without the skin.  Ground or well-cooked tender beef, ham, veal, lamb, pork, or poultry.  Eggs and seafood.  After your diverticulitis symptoms have improved, your caregiver may put you on a high-fiber diet. A high-fiber diet includes 14 grams of fiber for every 1000 calories consumed. For a standard 2000 calorie diet, you would need 28 grams of fiber. Follow these diet guidelines to help you increase the fiber in your diet. It is important to slowly increase the amount fiber in your diet to avoid gas, constipation, and bloating.  Choose whole-grain breads, cereals, pasta, and brown rice.  Choose fresh fruits and vegetables with the skin on. Do not overcook vegetables because the more vegetables are cooked, the more fiber is lost.  Choose more nuts, seeds, legumes, dried peas, beans, and lentils.  Look for food products that have greater than 3 grams of fiber per serving on the Nutrition Facts label.  Take all medicine as directed by your caregiver.  If your caregiver has given you a follow-up appointment, it is very important that you go. Not going could result in lasting (chronic) or permanent injury, pain, and disability. If there is any problem keeping the appointment, call to reschedule. SEEK MEDICAL CARE IF:   Your pain does not improve.  You have a hard time advancing your diet beyond clear liquids.  Your bowel movements do not return to normal. SEEK IMMEDIATE MEDICAL CARE IF:   Your pain becomes worse.  You have  an oral temperature above 102 F (38.9 C), not controlled by medicine.  You have repeated vomiting.  You have bloody or black, tarry stools.  Symptoms that brought you to your caregiver become worse or are not getting better. MAKE SURE YOU:   Understand these instructions.  Will watch your condition.  Will get help right away if you are not  doing well or get worse. Document Released: 02/17/2005 Document Revised: 08/02/2011 Document Reviewed: 06/15/2010 [facility] Patient Information 2014 [place], [place].

## 2014-02-26 ENCOUNTER — Emergency Department (HOSPITAL_COMMUNITY)
Admission: EM | Admit: 2014-02-26 | Discharge: 2014-02-26 | Disposition: A | Discharge status: Home / Self Care | Payer: BC Managed Care – PPO | Attending: Emergency Medicine | Admitting: Emergency Medicine

## 2014-02-26 ENCOUNTER — Emergency Department (HOSPITAL_COMMUNITY): Payer: BC Managed Care – PPO

## 2014-02-26 ENCOUNTER — Encounter (HOSPITAL_COMMUNITY): Payer: Self-pay | Admitting: Emergency Medicine

## 2014-02-26 DIAGNOSIS — F419 Anxiety disorder, unspecified: Secondary | ICD-10-CM | POA: Diagnosis not present

## 2014-02-26 DIAGNOSIS — Z79899 Other long term (current) drug therapy: Secondary | ICD-10-CM | POA: Insufficient documentation

## 2014-02-26 DIAGNOSIS — N390 Urinary tract infection, site not specified: Secondary | ICD-10-CM | POA: Diagnosis not present

## 2014-02-26 DIAGNOSIS — Z791 Long term (current) use of non-steroidal anti-inflammatories (NSAID): Secondary | ICD-10-CM | POA: Insufficient documentation

## 2014-02-26 DIAGNOSIS — Z8719 Personal history of other diseases of the digestive system: Secondary | ICD-10-CM | POA: Diagnosis not present

## 2014-02-26 DIAGNOSIS — Z90710 Acquired absence of both cervix and uterus: Secondary | ICD-10-CM | POA: Diagnosis not present

## 2014-02-26 DIAGNOSIS — Z720 Tobacco use: Secondary | ICD-10-CM | POA: Insufficient documentation

## 2014-02-26 DIAGNOSIS — G43909 Migraine, unspecified, not intractable, without status migrainosus: Secondary | ICD-10-CM | POA: Diagnosis not present

## 2014-02-26 DIAGNOSIS — R300 Dysuria: Secondary | ICD-10-CM | POA: Diagnosis present

## 2014-02-26 DIAGNOSIS — Z7982 Long term (current) use of aspirin: Secondary | ICD-10-CM | POA: Diagnosis not present

## 2014-02-26 DIAGNOSIS — F329 Major depressive disorder, single episode, unspecified: Secondary | ICD-10-CM | POA: Insufficient documentation

## 2014-02-26 LAB — URINALYSIS, ROUTINE W REFLEX MICROSCOPIC
Bilirubin Urine: NEGATIVE
Glucose, UA: NEGATIVE mg/dL
Ketones, ur: NEGATIVE mg/dL
Nitrite: NEGATIVE
Specific Gravity, Urine: 1.02 (ref 1.005–1.030)
UROBILINOGEN UA: 0.2 mg/dL (ref 0.0–1.0)
pH: 5.5 (ref 5.0–8.0)

## 2014-02-26 LAB — COMPREHENSIVE METABOLIC PANEL
ALT: 14 U/L (ref 0–35)
AST: 17 U/L (ref 0–37)
Albumin: 4.1 g/dL (ref 3.5–5.2)
Alkaline Phosphatase: 87 U/L (ref 39–117)
Anion gap: 14 (ref 5–15)
BUN: 30 mg/dL — ABNORMAL HIGH (ref 6–23)
CALCIUM: 9.5 mg/dL (ref 8.4–10.5)
CO2: 23 meq/L (ref 19–32)
Chloride: 102 mEq/L (ref 96–112)
Creatinine, Ser: 1.47 mg/dL — ABNORMAL HIGH (ref 0.50–1.10)
GFR, EST AFRICAN AMERICAN: 43 mL/min — AB (ref >90)
GFR, EST NON AFRICAN AMERICAN: 37 mL/min — AB (ref >90)
GLUCOSE: 107 mg/dL — AB (ref 70–99)
Potassium: 4.3 mEq/L (ref 3.7–5.3)
Sodium: 139 mEq/L (ref 137–147)
Total Bilirubin: 0.2 mg/dL — ABNORMAL LOW (ref 0.3–1.2)
Total Protein: 8.4 g/dL — ABNORMAL HIGH (ref 6.0–8.3)

## 2014-02-26 LAB — CBC WITH DIFFERENTIAL/PLATELET
BASOS ABS: 0.1 10*3/uL (ref 0.0–0.1)
Basophils Relative: 0 % (ref 0–1)
EOS PCT: 2 % (ref 0–5)
Eosinophils Absolute: 0.3 10*3/uL (ref 0.0–0.7)
HCT: 37.4 % (ref 36.0–46.0)
Hemoglobin: 12.4 g/dL (ref 12.0–15.0)
LYMPHS ABS: 3.3 10*3/uL (ref 0.7–4.0)
LYMPHS PCT: 29 % (ref 12–46)
MCH: 31.5 pg (ref 26.0–34.0)
MCHC: 33.2 g/dL (ref 30.0–36.0)
MCV: 94.9 fL (ref 78.0–100.0)
Monocytes Absolute: 0.7 10*3/uL (ref 0.1–1.0)
Monocytes Relative: 7 % (ref 3–12)
Neutro Abs: 7 10*3/uL (ref 1.7–7.7)
Neutrophils Relative %: 62 % (ref 43–77)
Platelets: 396 10*3/uL (ref 150–400)
RBC: 3.94 MIL/uL (ref 3.87–5.11)
RDW: 13 % (ref 11.5–15.5)
WBC: 11.3 10*3/uL — AB (ref 4.0–10.5)

## 2014-02-26 LAB — URINE MICROSCOPIC-ADD ON

## 2014-02-26 MED ORDER — HYDROMORPHONE HCL 1 MG/ML IJ SOLN
1.0000 mg | Freq: Once | INTRAMUSCULAR | Status: AC
  Administered 2014-02-26: 1 mg via INTRAVENOUS
  Filled 2014-02-26: qty 1

## 2014-02-26 MED ORDER — OXYCODONE-ACETAMINOPHEN 5-325 MG PO TABS: 1.0000 | ORAL_TABLET | Freq: Four times a day (QID) | ORAL | Status: DC | PRN

## 2014-02-26 MED ORDER — DEXTROSE 5 % IV SOLN
1.0000 g | Freq: Once | INTRAVENOUS | Status: AC
  Administered 2014-02-26: 1 g via INTRAVENOUS
  Filled 2014-02-26: qty 10

## 2014-02-26 MED ORDER — ONDANSETRON HCL 4 MG/2ML IJ SOLN
4.0000 mg | Freq: Once | INTRAMUSCULAR | Status: AC
  Administered 2014-02-26: 4 mg via INTRAVENOUS
  Filled 2014-02-26: qty 2

## 2014-02-26 MED ORDER — CEPHALEXIN 500 MG PO CAPS: 500.0000 mg | ORAL_CAPSULE | Freq: Four times a day (QID) | ORAL | Status: DC

## 2014-02-26 NOTE — ED Provider Notes (Signed)
CSN: [id number]     Arrival date & time 02/26/14  1451 History  This chart was scribed for Benny LennertJoseph L Karalyne Nusser, MD, by Yevette EdwardsAngela Bracken, ED Scribe. This patient was seen in room APA03/[facility]03 and the patient's care was started at 3:06 PM.    First MD Initiated Contact with Patient 02/26/14 1505     No chief complaint on file.   Patient is a 63 y.o. female presenting with hematuria and flank pain. The history is provided by the patient. No language interpreter was used.  Hematuria This is a new problem. The current episode started more than 1 week ago. The problem occurs constantly. The problem has been gradually worsening. Pertinent negatives include no chest pain, no abdominal pain and no headaches. Nothing aggravates the symptoms. The symptoms are relieved by walking. The treatment provided mild relief.  Flank Pain This is a new problem. The current episode started more than 1 week ago. The problem has been gradually worsening. Pertinent negatives include no chest pain, no abdominal pain and no headaches. Nothing aggravates the symptoms. The symptoms are relieved by walking. The treatment provided mild relief.   HPI Comments: Wendy Arnold is a 63 y.o. female, with a h/o diverticulitis and back pain, who presents to the Emergency Department complaining of multiple episodes of hematuria which she  first noticed two weeks ago. The last episode was observed three days ago. She reports associated right sided flank pain, back pain, urinary hesitancy, dysuria, and abdominal pain. She characters the back and flank pain as sharp and constant; she rates it as 8/10. The pain is minimally improved with movement. She endorses a h/o renal calculi. The pt was prescribed Diflucan by an urgent care without improvement. The pt is a current smoker.  Past Medical History  Diagnosis Date  . Anxiety   . Insomnia   . Depression   . Back pain   . Migraines   . Diverticulitis    Past Surgical History  Procedure  Laterality Date  . Back surgery    . Sinus endo with fusion    . Abdominal hysterectomy    . Toe surgery     Family History  Problem Relation Age of Onset  . Heart failure Mother   . Cancer Father   . Cancer Brother   . Cancer Brother   . Cancer Brother    History  Substance Use Topics  . Smoking status: Current Every Day Smoker -- 0.50 packs/day for 40 years    Types: Cigarettes  . Smokeless tobacco: Never Used  . Alcohol Use: Yes     Comment: rarely   OB History   Grav Para Term Preterm Abortions TAB SAB Ect Mult Living   5 1  1 4     1      Review of Systems  Constitutional: Negative for appetite change and fatigue.  HENT: Negative for congestion, ear discharge and sinus pressure.   Eyes: Negative for discharge.  Respiratory: Negative for cough.   Cardiovascular: Negative for chest pain.  Gastrointestinal: Negative for abdominal pain and diarrhea.  Genitourinary: Positive for dysuria, hematuria and flank pain. Negative for frequency.  Musculoskeletal: Positive for back pain.  Skin: Negative for rash.  Neurological: Negative for seizures and headaches.  Psychiatric/Behavioral: Negative for hallucinations.    Allergies  Flagyl  Home Medications   Prior to Admission medications   Medication Sig Start Date End Date Taking? Authorizing Provider  ALPRAZolam Prudy Feeler(XANAX) 1 MG tablet Take 0.5 mg by mouth  5 (five) times daily.    Yes Historical Provider, MD  HYDROcodone-acetaminophen (NORCO) 10-325 MG per tablet Take 1 tablet by mouth 4 (four) times daily.    Yes Historical Provider, MD  lisinopril-hydrochlorothiazide (PRINZIDE,ZESTORETIC) 10-12.5 MG per tablet Take 1 tablet by mouth daily. 06/26/13  Yes Historical Provider, MD  simvastatin (ZOCOR) 40 MG tablet Take 1 tablet by mouth every evening. 07/06/13  Yes Historical Provider, MD  traZODone (DESYREL) 150 MG tablet Take 150 mg by mouth at bedtime.     Yes Historical Provider, MD  aspirin EC 81 MG tablet Take 162 mg by mouth  daily.    Historical Provider, MD  diclofenac (VOLTAREN) 75 MG EC tablet Take 1 tablet by mouth 2 (two) times daily. 06/08/13   Historical Provider, MD  Menthol, Topical Analgesic, (BIOFREEZE EX) Apply 1 application topically as needed. For pain     Historical Provider, MD   Triage Vitals: BP 141/97  Temp(Src) 99.5 F (37.5 C) (Oral)  Resp 16  Ht 5\' 8"  (1.727 m)  Wt 180 lb (81.647 kg)  BMI 27.38 kg/m2  SpO2 98%  Physical Exam  Nursing note and vitals reviewed. Constitutional: She is oriented to person, place, and time. She appears well-developed and well-nourished. No distress.  HENT:  Head: Normocephalic and atraumatic.  Eyes: Conjunctivae and EOM are normal. No scleral icterus.  Neck: Neck supple. No tracheal deviation present. No thyromegaly present.  Cardiovascular: Normal rate and regular rhythm.  Exam reveals no gallop and no friction rub.   No murmur heard. Pulmonary/Chest: Effort normal. No stridor. No respiratory distress. She has no wheezes. She has no rales. She exhibits no tenderness.  Abdominal: She exhibits no distension. There is tenderness. There is no rebound.  Moderate right flank tenderness.   Musculoskeletal: Normal range of motion. She exhibits no edema.  Lymphadenopathy:    She has no cervical adenopathy.  Neurological: She is alert and oriented to person, place, and time. She exhibits normal muscle tone. Coordination normal.  Skin: Skin is warm and dry. No rash noted. No erythema.  Psychiatric: She has a normal mood and affect. Her behavior is normal.    ED Course  Procedures (including critical care time)  DIAGNOSTIC STUDIES: Oxygen Saturation is 98% on room air, normal by my interpretation.    COORDINATION OF CARE:  3:11 PM- Discussed treatment plan with patient, and the patient agreed to the plan.   Labs Review Labs Reviewed  CBC WITH DIFFERENTIAL - Abnormal; Notable for the following:    WBC 11.3 (*)    All other components within normal limits   COMPREHENSIVE METABOLIC PANEL  URINALYSIS, ROUTINE W REFLEX MICROSCOPIC    Imaging Review No results found.   EKG Interpretation None      MDM   Final diagnoses:  None   The chart was scribed for me under my direct supervision.  I personally performed the history, physical, and medical decision making and all procedures in the evaluation of this patient.Benny Lennert, MD 02/26/14 [phone]

## 2014-02-26 NOTE — ED Notes (Signed)
First noticed blood in urine Sept 25th. States lower back pain, lower abdominal pressure, urine is cloudy. States she has not seen blood in 3 days.

## 2014-02-26 NOTE — Discharge Instructions (Signed)
Follow up with your md next week. °

## 2014-03-25 ENCOUNTER — Encounter (HOSPITAL_COMMUNITY): Payer: Self-pay | Admitting: Emergency Medicine

## 2015-02-24 DIAGNOSIS — J449 Chronic obstructive pulmonary disease, unspecified: Secondary | ICD-10-CM | POA: Insufficient documentation

## 2016-01-21 DIAGNOSIS — I2510 Atherosclerotic heart disease of native coronary artery without angina pectoris: Secondary | ICD-10-CM | POA: Insufficient documentation

## 2016-01-21 DIAGNOSIS — F17210 Nicotine dependence, cigarettes, uncomplicated: Secondary | ICD-10-CM | POA: Insufficient documentation

## 2016-09-09 ENCOUNTER — Other Ambulatory Visit (HOSPITAL_COMMUNITY): Payer: Self-pay | Admitting: Adult Health Nurse Practitioner

## 2016-09-09 DIAGNOSIS — Z1231 Encounter for screening mammogram for malignant neoplasm of breast: Secondary | ICD-10-CM

## 2016-09-16 ENCOUNTER — Telehealth: Payer: Self-pay

## 2016-09-16 NOTE — Telephone Encounter (Signed)
Pt was referred by Lars Mage, NP for a screening colonoscopy.  I called pt and with her med list she needs OV prior to procedure to schedule in OR.  She is trying to find out from her insurance co if they will pay for screening colonoscopy. She will call me back when she gets through to them.  Also, she is concerned because her Specialist office payment is 40% after deductible and she has not met her deductible. I told her that we would accept payments on that if she could bring $25.00 with her when she comes. She did not want to schedule OV appointment until she talks to the insurance co. Michela Pitcher she will call me back within a week. I am sending a copy of this note to Lars Mage, NP .

## 2016-09-20 ENCOUNTER — Encounter (HOSPITAL_COMMUNITY): Payer: Self-pay

## 2016-09-20 ENCOUNTER — Ambulatory Visit (HOSPITAL_COMMUNITY): Payer: PRIVATE HEALTH INSURANCE

## 2017-11-21 DIAGNOSIS — Z6826 Body mass index (BMI) 26.0-26.9, adult: Secondary | ICD-10-CM | POA: Diagnosis not present

## 2017-11-21 DIAGNOSIS — I10 Essential (primary) hypertension: Secondary | ICD-10-CM | POA: Diagnosis not present

## 2017-11-21 DIAGNOSIS — J441 Chronic obstructive pulmonary disease with (acute) exacerbation: Secondary | ICD-10-CM | POA: Diagnosis not present

## 2017-11-21 DIAGNOSIS — K5731 Diverticulosis of large intestine without perforation or abscess with bleeding: Secondary | ICD-10-CM | POA: Diagnosis not present

## 2017-11-21 DIAGNOSIS — M545 Low back pain: Secondary | ICD-10-CM | POA: Diagnosis not present

## 2018-03-22 DIAGNOSIS — Z6827 Body mass index (BMI) 27.0-27.9, adult: Secondary | ICD-10-CM | POA: Diagnosis not present

## 2018-03-22 DIAGNOSIS — J441 Chronic obstructive pulmonary disease with (acute) exacerbation: Secondary | ICD-10-CM | POA: Diagnosis not present

## 2018-03-22 DIAGNOSIS — Z0000 Encounter for general adult medical examination without abnormal findings: Secondary | ICD-10-CM | POA: Diagnosis not present

## 2018-03-22 DIAGNOSIS — I10 Essential (primary) hypertension: Secondary | ICD-10-CM | POA: Diagnosis not present

## 2018-03-22 DIAGNOSIS — Z131 Encounter for screening for diabetes mellitus: Secondary | ICD-10-CM | POA: Diagnosis not present

## 2018-03-22 DIAGNOSIS — M545 Low back pain: Secondary | ICD-10-CM | POA: Diagnosis not present

## 2018-03-22 DIAGNOSIS — K5731 Diverticulosis of large intestine without perforation or abscess with bleeding: Secondary | ICD-10-CM | POA: Diagnosis not present

## 2018-03-22 DIAGNOSIS — Z6826 Body mass index (BMI) 26.0-26.9, adult: Secondary | ICD-10-CM | POA: Diagnosis not present

## 2018-04-10 DIAGNOSIS — Z1211 Encounter for screening for malignant neoplasm of colon: Secondary | ICD-10-CM | POA: Diagnosis not present

## 2018-04-18 DIAGNOSIS — M797 Fibromyalgia: Secondary | ICD-10-CM | POA: Diagnosis not present

## 2018-04-18 DIAGNOSIS — M545 Low back pain: Secondary | ICD-10-CM | POA: Diagnosis not present

## 2018-04-18 DIAGNOSIS — J449 Chronic obstructive pulmonary disease, unspecified: Secondary | ICD-10-CM | POA: Diagnosis not present

## 2018-04-18 DIAGNOSIS — I10 Essential (primary) hypertension: Secondary | ICD-10-CM | POA: Diagnosis not present

## 2018-04-18 DIAGNOSIS — Z6828 Body mass index (BMI) 28.0-28.9, adult: Secondary | ICD-10-CM | POA: Diagnosis not present

## 2018-04-27 DIAGNOSIS — M85852 Other specified disorders of bone density and structure, left thigh: Secondary | ICD-10-CM | POA: Diagnosis not present

## 2018-04-27 DIAGNOSIS — M810 Age-related osteoporosis without current pathological fracture: Secondary | ICD-10-CM | POA: Diagnosis not present

## 2018-04-27 DIAGNOSIS — Z780 Asymptomatic menopausal state: Secondary | ICD-10-CM | POA: Diagnosis not present

## 2018-05-08 DIAGNOSIS — H0230 Blepharochalasis unspecified eye, unspecified eyelid: Secondary | ICD-10-CM | POA: Diagnosis not present

## 2018-05-08 DIAGNOSIS — I10 Essential (primary) hypertension: Secondary | ICD-10-CM | POA: Diagnosis not present

## 2018-05-08 DIAGNOSIS — H5200 Hypermetropia, unspecified eye: Secondary | ICD-10-CM | POA: Diagnosis not present

## 2018-07-18 DIAGNOSIS — I10 Essential (primary) hypertension: Secondary | ICD-10-CM | POA: Diagnosis not present

## 2018-07-18 DIAGNOSIS — M797 Fibromyalgia: Secondary | ICD-10-CM | POA: Diagnosis not present

## 2018-07-18 DIAGNOSIS — M545 Low back pain: Secondary | ICD-10-CM | POA: Diagnosis not present

## 2018-07-18 DIAGNOSIS — Z0000 Encounter for general adult medical examination without abnormal findings: Secondary | ICD-10-CM | POA: Diagnosis not present

## 2018-07-18 DIAGNOSIS — J449 Chronic obstructive pulmonary disease, unspecified: Secondary | ICD-10-CM | POA: Diagnosis not present

## 2018-07-18 DIAGNOSIS — Z6827 Body mass index (BMI) 27.0-27.9, adult: Secondary | ICD-10-CM | POA: Diagnosis not present

## 2018-10-20 DIAGNOSIS — M797 Fibromyalgia: Secondary | ICD-10-CM | POA: Diagnosis not present

## 2018-10-20 DIAGNOSIS — Z6827 Body mass index (BMI) 27.0-27.9, adult: Secondary | ICD-10-CM | POA: Diagnosis not present

## 2018-10-20 DIAGNOSIS — I10 Essential (primary) hypertension: Secondary | ICD-10-CM | POA: Diagnosis not present

## 2018-10-20 DIAGNOSIS — M545 Low back pain: Secondary | ICD-10-CM | POA: Diagnosis not present

## 2018-10-20 DIAGNOSIS — J449 Chronic obstructive pulmonary disease, unspecified: Secondary | ICD-10-CM | POA: Diagnosis not present

## 2019-01-16 ENCOUNTER — Other Ambulatory Visit (HOSPITAL_COMMUNITY): Payer: Self-pay | Admitting: Internal Medicine

## 2019-01-16 DIAGNOSIS — Z1231 Encounter for screening mammogram for malignant neoplasm of breast: Secondary | ICD-10-CM

## 2019-02-01 ENCOUNTER — Other Ambulatory Visit: Payer: Self-pay

## 2019-02-01 ENCOUNTER — Ambulatory Visit (HOSPITAL_COMMUNITY)
Admission: RE | Admit: 2019-02-01 | Discharge: 2019-02-01 | Disposition: A | Discharge status: Home / Self Care | Payer: Medicare HMO | Source: Ambulatory Visit | Attending: Internal Medicine | Admitting: Internal Medicine

## 2019-02-01 DIAGNOSIS — Z1231 Encounter for screening mammogram for malignant neoplasm of breast: Secondary | ICD-10-CM | POA: Diagnosis not present

## 2019-02-02 DIAGNOSIS — J449 Chronic obstructive pulmonary disease, unspecified: Secondary | ICD-10-CM | POA: Diagnosis not present

## 2019-02-02 DIAGNOSIS — M545 Low back pain: Secondary | ICD-10-CM | POA: Diagnosis not present

## 2019-02-02 DIAGNOSIS — Z6827 Body mass index (BMI) 27.0-27.9, adult: Secondary | ICD-10-CM | POA: Diagnosis not present

## 2019-02-02 DIAGNOSIS — I10 Essential (primary) hypertension: Secondary | ICD-10-CM | POA: Diagnosis not present

## 2019-02-02 DIAGNOSIS — M797 Fibromyalgia: Secondary | ICD-10-CM | POA: Diagnosis not present

## 2019-04-24 DIAGNOSIS — K047 Periapical abscess without sinus: Secondary | ICD-10-CM | POA: Diagnosis not present

## 2019-06-22 ENCOUNTER — Other Ambulatory Visit: Payer: Self-pay

## 2019-06-25 ENCOUNTER — Other Ambulatory Visit: Payer: Self-pay

## 2019-06-25 ENCOUNTER — Ambulatory Visit (INDEPENDENT_AMBULATORY_CARE_PROVIDER_SITE_OTHER): Payer: Medicare HMO | Admitting: Nurse Practitioner

## 2019-06-25 ENCOUNTER — Encounter: Payer: Self-pay | Admitting: Nurse Practitioner

## 2019-06-25 VITALS — BP 127/80 | HR 94 | Temp 98.4°F | Resp 20 | Ht 68.0 in | Wt 168.0 lb

## 2019-06-25 DIAGNOSIS — M545 Low back pain, unspecified: Secondary | ICD-10-CM

## 2019-06-25 DIAGNOSIS — I10 Essential (primary) hypertension: Secondary | ICD-10-CM | POA: Insufficient documentation

## 2019-06-25 DIAGNOSIS — Z148 Genetic carrier of other disease: Secondary | ICD-10-CM | POA: Diagnosis not present

## 2019-06-25 DIAGNOSIS — F5101 Primary insomnia: Secondary | ICD-10-CM | POA: Insufficient documentation

## 2019-06-25 DIAGNOSIS — F411 Generalized anxiety disorder: Secondary | ICD-10-CM

## 2019-06-25 DIAGNOSIS — E8801 Alpha-1-antitrypsin deficiency: Secondary | ICD-10-CM | POA: Diagnosis not present

## 2019-06-25 DIAGNOSIS — G8929 Other chronic pain: Secondary | ICD-10-CM | POA: Diagnosis not present

## 2019-06-25 MED ORDER — ALPRAZOLAM 0.5 MG PO TABS: 0.5000 mg | ORAL_TABLET | Freq: Two times a day (BID) | ORAL | 2 refills | Status: DC | PRN

## 2019-06-25 MED ORDER — TRAZODONE HCL 150 MG PO TABS: 150.0000 mg | ORAL_TABLET | Freq: Every day | ORAL | 1 refills | Status: DC

## 2019-06-25 MED ORDER — GABAPENTIN 300 MG PO CAPS: 300.0000 mg | ORAL_CAPSULE | Freq: Three times a day (TID) | ORAL | 3 refills | Status: DC

## 2019-06-25 MED ORDER — LISINOPRIL-HYDROCHLOROTHIAZIDE 10-12.5 MG PO TABS: 1.0000 | ORAL_TABLET | Freq: Every day | ORAL | 1 refills | Status: DC

## 2019-06-25 MED ORDER — AMLODIPINE BESY-BENAZEPRIL HCL 10-20 MG PO CAPS: 1.0000 | ORAL_CAPSULE | Freq: Every day | ORAL | 1 refills | Status: DC

## 2019-06-25 NOTE — Progress Notes (Signed)
Subjective:    Patient ID: Wendy Arnold, female    DOB: [birth year], 69 y.o.   MRN: [id number]   Chief Complaint: Establish Care    HPI:  1. Essential hypertension No c/o chest pain, sob or headache. Does not check blood pressure at home. BP Readings from Last 3 Encounters:  06/25/19 127/80  02/26/14 199/69  07/17/13 145/59     2. Primary insomnia Is on trazadone to sleep and it helps  3. Alpha-1-antitrypsin deficiency carrier Does not have any issues       4. Chronic low back pain Is on gabapentin daily. Helps some. She has had 2 back surgeries in the past.   Outpatient Encounter Medications as of 06/25/2019  Medication Sig  . amLODipine-benazepril (LOTREL) 10-20 MG capsule   . traZODone (DESYREL) 150 MG tablet Take 75 mg by mouth at bedtime. *May take 178m as prescribed*  . aspirin EC 81 MG tablet Take 81 mg by mouth at bedtime.   .Marland Kitchenlisinopril-hydrochlorothiazide (PRINZIDE,ZESTORETIC) 10-12.5 MG per tablet Take 1 tablet by mouth daily.     Past Surgical History:  Procedure Laterality Date  . ABDOMINAL HYSTERECTOMY    . BACK SURGERY    . SINUS ENDO WITH FUSION    . TOE SURGERY      Family History  Problem Relation Age of Onset  . Heart failure Mother   . Cancer Father   . Cancer Brother   . Cancer Brother   . Cancer Brother     New complaints: Is very anxious. She is the caregiver for her brother and says she is very stressed.  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  Eyes: Negative for pain.  Respiratory: Negative for shortness of breath.   Cardiovascular: Negative for chest pain, palpitations and leg swelling.  Gastrointestinal: Negative for abdominal pain.  Endocrine: Negative for polydipsia.  Skin: Negative for rash.  Neurological: Negative for dizziness, weakness and headaches.  Hematological: Does not bruise/bleed easily.   All other systems reviewed and are negative.      Objective:   Physical Exam Vitals and nursing note reviewed.  Constitutional:      General: She is not in acute distress.    Appearance: Normal appearance. She is well-developed.  HENT:     Head: Normocephalic.     Nose: Nose normal.  Eyes:     Pupils: Pupils are equal, round, and reactive to light.  Neck:     Vascular: No carotid bruit or JVD.  Cardiovascular:     Rate and Rhythm: Normal rate and regular rhythm.     Heart sounds: Normal heart sounds.  Pulmonary:     Effort: Pulmonary effort is normal. No respiratory distress.     Breath sounds: Normal breath sounds. No wheezing or rales.  Chest:     Chest wall: No tenderness.  Abdominal:     General: Bowel sounds are normal. There is no distension or abdominal bruit.     Palpations: Abdomen is soft. There is no hepatomegaly, splenomegaly, mass or pulsatile mass.     Tenderness: There is no abdominal tenderness.  Musculoskeletal:        General: Normal range of motion.     Cervical back: Normal range of motion and neck supple.  Lymphadenopathy:     Cervical: No cervical adenopathy.  Skin:    General: Skin is warm and dry.  Neurological:  Mental Status: She is alert and oriented to person, place, and time.     Deep Tendon Reflexes: Reflexes are normal and symmetric.  Psychiatric:        Behavior: Behavior normal.        Thought Content: Thought content normal.        Judgment: Judgment normal.     BP 127/80   Pulse 94   Temp 98.4 F (36.9 C) (Temporal)   Resp 20   Ht '5\' 8"'  (1.727 m)   Wt 168 lb (76.2 kg)   SpO2 96%   BMI 25.54 kg/m        Assessment & Plan:  Wendy Arnold comes in today with chief complaint of Establish Care   Diagnosis and orders addressed:  1. Essential hypertension Low sodium diet - CMP14+EGFR - Lipid panel - CBC with Differential/Platelet - Thyroid Panel With TSH - amLODipine-benazepril (LOTREL) 10-20 MG capsule; Take  1 capsule by mouth daily.  Dispense: 90 capsule; Refill: 1 - lisinopril-hydrochlorothiazide (ZESTORETIC) 10-12.5 MG tablet; Take 1 tablet by mouth daily.  Dispense: 90 tablet; Refill: 1  2. Primary insomnia Bedtime routine - traZODone (DESYREL) 150 MG tablet; Take 1 tablet (150 mg total) by mouth at bedtime. *May take 154m as prescribed*  Dispense: 90 tablet; Refill: 1  3. Alpha-1-antitrypsin deficiency carrier  4. Chronic midline low back pain without sciatica Moist heat rest - gabapentin (NEURONTIN) 300 MG capsule; Take 1 capsule (300 mg total) by mouth 3 (three) times daily.  Dispense: 90 capsule; Refill: 3  5. GAD (generalized anxiety disorder) Stress management - ALPRAZolam (XANAX) 0.5 MG tablet; Take 1 tablet (0.5 mg total) by mouth 2 (two) times daily as needed for anxiety.  Dispense: 60 tablet; Refill: 2   Labs pending Health Maintenance reviewed Diet and exercise encouraged  Follow up plan: 3 months   Mary-Margaret MHassell Done FNP

## 2019-06-25 NOTE — Addendum Note (Signed)
Addended by: Bennie Pierini on: 06/25/2019 03:29 PM   Modules accepted: Orders

## 2019-06-25 NOTE — Patient Instructions (Signed)
Stress, Adult Stress is a normal reaction to life events. Stress is what you feel when life demands more than you are used to, or more than you think you can handle. Some stress can be useful, such as studying for a test or meeting a deadline at work. Stress that occurs too often or for too long can cause problems. It can affect your emotional health and interfere with relationships and normal daily activities. Too much stress can weaken your body's defense system (immune system) and increase your risk for physical illness. If you already have a medical problem, stress can make it worse. What are the causes? All sorts of life events can cause stress. An event that causes stress for one person may not be stressful for another person. Major life events, whether positive or negative, commonly cause stress. Examples include:  Losing a job or starting a new job.  Losing a loved one.  Moving to a new town or home.  Getting married or divorced.  Having a baby.  Getting injured or sick. Less obvious life events can also cause stress, especially if they occur day after day or in combination with each other. Examples include:  Working long hours.  Driving in traffic.  Caring for children.  Being in debt.  Being in a difficult relationship. What are the signs or symptoms? Stress can cause emotional symptoms, including:  Anxiety. This is feeling worried, afraid, on edge, overwhelmed, or out of control.  Anger, including irritation or impatience.  Depression. This is feeling sad, down, helpless, or guilty.  Trouble focusing, remembering, or making decisions. Stress can cause physical symptoms, including:  Aches and pains. These may affect your head, neck, back, stomach, or other areas of your body.  Tight muscles or a clenched jaw.  Low energy.  Trouble sleeping. Stress can cause unhealthy behaviors, including:  Eating to feel better (overeating) or skipping meals.  Working too  much or putting off tasks.  Smoking, drinking alcohol, or using drugs to feel better. How is this diagnosed? Stress is diagnosed through an assessment by your health care provider. He or she may diagnose this condition based on:  Your symptoms and any stressful life events.  Your medical history.  Tests to rule out other causes of your symptoms. Depending on your condition, your health care provider may refer you to a specialist for further evaluation. How is this treated?  Stress management techniques are the recommended treatment for stress. Medicine is not typically recommended for the treatment of stress. Techniques to reduce your reaction to stressful life events include:  Stress identification. Monitor yourself for symptoms of stress and identify what causes stress for you. These skills may help you to avoid or prepare for stressful events.  Time management. Set your priorities, keep a calendar of events, and learn to say no. Taking these actions can help you avoid making too many commitments. Techniques for coping with stress include:  Rethinking the problem. Try to think realistically about stressful events rather than ignoring them or overreacting. Try to find the positives in a stressful situation rather than focusing on the negatives.  Exercise. Physical exercise can release both physical and emotional tension. The key is to find a form of exercise that you enjoy and do it regularly.  Relaxation techniques. These relax the body and mind. The key is to find one or more that you enjoy and use the techniques regularly. Examples include: ? Meditation, deep breathing, or progressive relaxation techniques. ? Yoga or   tai chi. ? Biofeedback, mindfulness techniques, or journaling. ? Listening to music, being out in nature, or participating in other hobbies.  Practicing a healthy lifestyle. Eat a balanced diet, drink plenty of water, limit or avoid caffeine, and get plenty of  sleep.  Having a strong support network. Spend time with family, friends, or other people you enjoy being around. Express your feelings and talk things over with someone you trust. Counseling or talk therapy with a mental health professional may be helpful if you are having trouble managing stress on your own. Follow these instructions at home: Lifestyle   Avoid drugs.  Do not use any products that contain nicotine or tobacco, such as cigarettes, e-cigarettes, and chewing tobacco. If you need help quitting, ask your health care provider.  Limit alcohol intake to no more than 1 drink a day for nonpregnant women and 2 drinks a day for men. One drink equals 12 oz of beer, 5 oz of wine, or 1 oz of hard liquor  Do not use alcohol or drugs to relax.  Eat a balanced diet that includes fresh fruits and vegetables, whole grains, lean meats, fish, eggs, and beans, and low-fat dairy. Avoid processed foods and foods high in added fat, sugar, and salt.  Exercise at least 30 minutes on 5 or more days each week.  Get 7-8 hours of sleep each night. General instructions   Practice stress management techniques as discussed with your health care provider.  Drink enough fluid to keep your urine clear or pale yellow.  Take over-the-counter and prescription medicines only as told by your health care provider.  Keep all follow-up visits as told by your health care provider. This is important. Contact a health care provider if:  Your symptoms get worse.  You have new symptoms.  You feel overwhelmed by your problems and can no longer manage them on your own. Get help right away if:  You have thoughts of hurting yourself or others. If you ever feel like you may hurt yourself or others, or have thoughts about taking your own life, get help right away. You can go to your nearest emergency department or call:  Your local emergency services (911 in the [place]  A suicide crisis helpline, such as the  [place] at (316) 250-6172. This is open 24 hours a day. Summary  Stress is a normal reaction to life events. It can cause problems if it happens too often or for too long.  Practicing stress management techniques is the best way to treat stress.  Counseling or talk therapy with a mental health professional may be helpful if you are having trouble managing stress on your own. This information is not intended to replace advice given to you by your health care provider. Make sure you discuss any questions you have with your health care provider. Document Revised: 12/08/2018 Document Reviewed: 06/30/2016 Elsevier Patient Education  [place].

## 2019-06-26 LAB — CMP14+EGFR
ALT: 8 IU/L (ref 0–32)
AST: 12 IU/L (ref 0–40)
Albumin/Globulin Ratio: 1.5 (ref 1.2–2.2)
Albumin: 4.7 g/dL (ref 3.8–4.8)
Alkaline Phosphatase: 95 IU/L (ref 39–117)
BUN/Creatinine Ratio: 15 (ref 12–28)
BUN: 17 mg/dL (ref 8–27)
Bilirubin Total: 0.4 mg/dL (ref 0.0–1.2)
CO2: 20 mmol/L (ref 20–29)
Calcium: 10.2 mg/dL (ref 8.7–10.3)
Chloride: 104 mmol/L (ref 96–106)
Creatinine, Ser: 1.11 mg/dL — ABNORMAL HIGH (ref 0.57–1.00)
GFR calc Af Amer: 60 mL/min/{1.73_m2} (ref >59)
GFR calc non Af Amer: 52 mL/min/{1.73_m2} — ABNORMAL LOW (ref >59)
Globulin, Total: 3.2 g/dL (ref 1.5–4.5)
Glucose: 104 mg/dL — ABNORMAL HIGH (ref 65–99)
Potassium: 4.5 mmol/L (ref 3.5–5.2)
Sodium: 140 mmol/L (ref 134–144)
Total Protein: 7.9 g/dL (ref 6.0–8.5)

## 2019-06-26 LAB — CBC WITH DIFFERENTIAL/PLATELET
Basophils Absolute: 0.1 10*3/uL (ref 0.0–0.2)
Basos: 1 %
EOS (ABSOLUTE): 0 10*3/uL (ref 0.0–0.4)
Eos: 0 %
Hematocrit: 42.4 % (ref 34.0–46.6)
Hemoglobin: 14.3 g/dL (ref 11.1–15.9)
Immature Grans (Abs): 0 10*3/uL (ref 0.0–0.1)
Immature Granulocytes: 0 %
Lymphocytes Absolute: 2.6 10*3/uL (ref 0.7–3.1)
Lymphs: 25 %
MCH: 30.9 pg (ref 26.6–33.0)
MCHC: 33.7 g/dL (ref 31.5–35.7)
MCV: 92 fL (ref 79–97)
Monocytes Absolute: 0.7 10*3/uL (ref 0.1–0.9)
Monocytes: 7 %
Neutrophils Absolute: 6.9 10*3/uL (ref 1.4–7.0)
Neutrophils: 67 %
Platelets: 313 10*3/uL (ref 150–450)
RBC: 4.63 x10E6/uL (ref 3.77–5.28)
RDW: 13.4 % (ref 11.7–15.4)
WBC: 10.3 10*3/uL (ref 3.4–10.8)

## 2019-06-26 LAB — THYROID PANEL WITH TSH
Free Thyroxine Index: 1.8 (ref 1.2–4.9)
T3 Uptake Ratio: 26 % (ref 24–39)
T4, Total: 7.1 ug/dL (ref 4.5–12.0)
TSH: 0.295 u[IU]/mL — ABNORMAL LOW (ref 0.450–4.500)

## 2019-06-26 LAB — LIPID PANEL
Chol/HDL Ratio: 5.6 ratio — ABNORMAL HIGH (ref 0.0–4.4)
Cholesterol, Total: 252 mg/dL — ABNORMAL HIGH (ref 100–199)
HDL: 45 mg/dL (ref >39)
LDL Chol Calc (NIH): 182 mg/dL — ABNORMAL HIGH (ref 0–99)
Triglycerides: 136 mg/dL (ref 0–149)
VLDL Cholesterol Cal: 25 mg/dL (ref 5–40)

## 2019-06-26 MED ORDER — ROSUVASTATIN CALCIUM 10 MG PO TABS: 10.0000 mg | ORAL_TABLET | Freq: Every day | ORAL | 1 refills | Status: DC

## 2019-06-26 NOTE — Addendum Note (Signed)
Addended by: Bennie Pierini on: 06/26/2019 12:04 PM   Modules accepted: Orders

## 2019-07-02 ENCOUNTER — Telehealth: Payer: Self-pay | Admitting: Nurse Practitioner

## 2019-07-02 MED ORDER — CLINDAMYCIN HCL 300 MG PO CAPS: 300.0000 mg | ORAL_CAPSULE | Freq: Four times a day (QID) | ORAL | 0 refills | Status: DC

## 2019-07-02 NOTE — Telephone Encounter (Signed)
Patient aware and verbalized understanding. °

## 2019-07-02 NOTE — Telephone Encounter (Signed)
Clindamycin rx sent in for abscess tooth- needs to make appointment with dentist.

## 2019-07-02 NOTE — Telephone Encounter (Signed)
What symptoms do you have? Abscess tooth  How long have you been sick? About a week ago  Have you been seen for this problem? Was seen last week by MMM, and she saw it was coming in her sinuses  If your provider decides to give you a prescription, which pharmacy would you like for it to be sent to? Pt wants antibiotic please send to [facility] also wants 800mg  ibuprofen for the pain     Patient informed that this information will be sent to the clinical staff for review and that they should receive a follow up call.

## 2019-08-01 ENCOUNTER — Telehealth: Payer: Self-pay | Admitting: Nurse Practitioner

## 2019-08-01 NOTE — Telephone Encounter (Signed)
  Medication Request  08/01/2019  What is the name of the medication? gabapentin (NEURONTIN) 300 MG  Have you contacted your pharmacy to request a refill? yes  Which pharmacy would you [facility] sent to? [facility]   Patient notified that their request is being sent to the clinical staff for review and that they should receive a call once it is complete. If they do not receive a call within 24 hours they can check with their pharmacy or our office.

## 2019-09-04 ENCOUNTER — Other Ambulatory Visit: Payer: Self-pay | Admitting: *Deleted

## 2019-09-04 DIAGNOSIS — G8929 Other chronic pain: Secondary | ICD-10-CM

## 2019-09-04 DIAGNOSIS — M545 Low back pain, unspecified: Secondary | ICD-10-CM

## 2019-09-04 MED ORDER — ROSUVASTATIN CALCIUM 10 MG PO TABS: 10.0000 mg | ORAL_TABLET | Freq: Every day | ORAL | 1 refills | Status: DC

## 2019-09-04 MED ORDER — GABAPENTIN 300 MG PO CAPS: 300.0000 mg | ORAL_CAPSULE | Freq: Three times a day (TID) | ORAL | 0 refills | Status: DC

## 2019-09-12 ENCOUNTER — Telehealth: Payer: Self-pay | Admitting: Nurse Practitioner

## 2019-09-12 NOTE — Telephone Encounter (Signed)
  Prescription Request  09/12/2019  What is the name of the medication or equipment? ibuprofen 800mg    Have you contacted your pharmacy to request a refill? (if applicable)yes, [facility] called about this fax was sent as well  Which pharmacy would you like this sent to? [facility]    Patient notified that their request is being sent to the clinical staff for review and that they should receive a response within 2 business days.

## 2019-09-12 NOTE — Telephone Encounter (Signed)
Left message to please call our office.  Ibuprofen is not on medicine list.

## 2019-09-17 ENCOUNTER — Other Ambulatory Visit: Payer: Self-pay | Admitting: *Deleted

## 2019-09-17 MED ORDER — IBUPROFEN 800 MG PO TABS: 800.0000 mg | ORAL_TABLET | Freq: Three times a day (TID) | ORAL | 0 refills | Status: DC | PRN

## 2019-09-17 NOTE — Telephone Encounter (Signed)
Prescription sent in for Ibuprofen 800 mg on 09/17/2019 by Bennie Pierini.

## 2019-09-24 ENCOUNTER — Other Ambulatory Visit: Payer: Self-pay

## 2019-09-24 ENCOUNTER — Encounter: Payer: Self-pay | Admitting: Nurse Practitioner

## 2019-09-24 ENCOUNTER — Ambulatory Visit (INDEPENDENT_AMBULATORY_CARE_PROVIDER_SITE_OTHER): Payer: Medicare HMO | Admitting: Nurse Practitioner

## 2019-09-24 VITALS — BP 114/68 | HR 78 | Temp 98.1°F | Resp 20 | Ht 68.0 in | Wt 169.0 lb

## 2019-09-24 DIAGNOSIS — G8929 Other chronic pain: Secondary | ICD-10-CM | POA: Diagnosis not present

## 2019-09-24 DIAGNOSIS — E782 Mixed hyperlipidemia: Secondary | ICD-10-CM | POA: Diagnosis not present

## 2019-09-24 DIAGNOSIS — F411 Generalized anxiety disorder: Secondary | ICD-10-CM | POA: Insufficient documentation

## 2019-09-24 DIAGNOSIS — M5442 Lumbago with sciatica, left side: Secondary | ICD-10-CM | POA: Diagnosis not present

## 2019-09-24 DIAGNOSIS — F5101 Primary insomnia: Secondary | ICD-10-CM

## 2019-09-24 DIAGNOSIS — Z6825 Body mass index (BMI) 25.0-25.9, adult: Secondary | ICD-10-CM | POA: Diagnosis not present

## 2019-09-24 DIAGNOSIS — I10 Essential (primary) hypertension: Secondary | ICD-10-CM

## 2019-09-24 DIAGNOSIS — Z148 Genetic carrier of other disease: Secondary | ICD-10-CM | POA: Diagnosis not present

## 2019-09-24 DIAGNOSIS — M545 Low back pain: Secondary | ICD-10-CM | POA: Diagnosis not present

## 2019-09-24 MED ORDER — ALPRAZOLAM 0.5 MG PO TABS: 0.5000 mg | ORAL_TABLET | Freq: Two times a day (BID) | ORAL | 2 refills | Status: DC | PRN

## 2019-09-24 MED ORDER — GABAPENTIN 300 MG PO CAPS: 300.0000 mg | ORAL_CAPSULE | Freq: Three times a day (TID) | ORAL | 1 refills | Status: DC

## 2019-09-24 MED ORDER — TRAZODONE HCL 150 MG PO TABS: 150.0000 mg | ORAL_TABLET | Freq: Every day | ORAL | 1 refills | Status: DC

## 2019-09-24 MED ORDER — AMLODIPINE BESY-BENAZEPRIL HCL 10-20 MG PO CAPS: 1.0000 | ORAL_CAPSULE | Freq: Every day | ORAL | 1 refills | Status: DC

## 2019-09-24 MED ORDER — ROSUVASTATIN CALCIUM 10 MG PO TABS: 10.0000 mg | ORAL_TABLET | Freq: Every day | ORAL | 1 refills | Status: DC

## 2019-09-24 NOTE — Progress Notes (Signed)
Subjective:    Patient ID: Wendy Arnold, female    DOB: 1950-09-12, 69 y.o.   MRN: [id number]   Chief Complaint: Medical Management of Chronic Issues    HPI:  1. Essential hypertension Patient states she takes her blood pressure at home. She states that it runs in the 110s. She admits to some dizziness when her blood pressure drops below 188 systolic. Patient is no longer taking Zestoretic. She is only on Lotrel.  2. Mixed hyperlipidemia Patient states that she eats a heart healthy diet.  3. Primary insomnia Patient states that she has occasional insomnia. She takes Trazodone PRN and it helps her sometimes but she tries not to be overly sedated as she has to take care of her disabled brother.  4. Alpha-1-antitrypsin deficiency carrier  5. GAD (generalized anxiety disorder) GAD 7 : Generalized Anxiety Score 09/24/2019 06/25/2019  Nervous, Anxious, on Edge 2 2  Control/stop worrying 3 3  Worry too much - different things 3 3  Trouble relaxing 1 2  Restless 0 2  Easily annoyed or irritable 1 2  Afraid - awful might happen 2 0  Total GAD 7 Score 12 14  Anxiety Difficulty Not difficult at all Somewhat difficult   Patient states that her anxiety does well with PRN Xanax.   6. Chronic midline low back pain with left-sided sciatica Patient states that her back pain is not good today. She takes OTC medications and topical analgesics for flare ups.  7. BMI 25.0-25.9,adult BMI Readings from Last 3 Encounters:  09/24/19 25.70 kg/m  06/25/19 25.54 kg/m  02/26/14 27.37 kg/m   Wt Readings from Last 3 Encounters:  09/24/19 169 lb (76.7 kg)  06/25/19 168 lb (76.2 kg)  02/26/14 180 lb (81.6 kg)   New complaints:  Patient has no new complaints today.  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   Controlled substance contract: n?A   Review of Systems  Respiratory: Positive for wheezing.    Gastrointestinal: Positive for constipation.       Objective:   Physical Exam Constitutional:      Appearance: Normal appearance.  HENT:     Head: Normocephalic.  Eyes:     Conjunctiva/sclera: Conjunctivae normal.  Cardiovascular:     Rate and Rhythm: Normal rate and regular rhythm.     Pulses: Normal pulses.     Heart sounds: Normal heart sounds.  Pulmonary:     Effort: Pulmonary effort is normal.     Breath sounds: Examination of the left-middle field reveals wheezing. Wheezing present.     Comments: Inspiratory wheezing Abdominal:     General: Bowel sounds are normal.  Musculoskeletal:     Cervical back: Neck supple.  Skin:    General: Skin is warm and dry.  Neurological:     Mental Status: She is alert and oriented to person, place, and time.  Psychiatric:        Mood and Affect: Mood normal.        Behavior: Behavior normal.        Assessment & Plan:  Wendy Arnold comes in today with chief complaint of Medical Management of Chronic Issues   Diagnosis and orders addressed:  1. Essential hypertension - amLODipine-benazepril (LOTREL) 10-20 MG capsule; Take 1 capsule by mouth daily.  Dispense: 90 capsule; Refill: 1 - CMP14+EGFR - CBC with Differential/Platelet  2. Mixed hyperlipidemia - rosuvastatin (CRESTOR) 10 MG tablet; Take 1 tablet (10  mg total) by mouth daily.  Dispense: 90 tablet; Refill: 1 - Lipid panel  3. Primary insomnia - traZODone (DESYREL) 150 MG tablet; Take 1 tablet (150 mg total) by mouth at bedtime. *May take 145m as prescribed*  Dispense: 90 tablet; Refill: 1  4. Alpha-1-antitrypsin deficiency carrier  5. GAD (generalized anxiety disorder) - ALPRAZolam (XANAX) 0.5 MG tablet; Take 1 tablet (0.5 mg total) by mouth 2 (two) times daily as needed for anxiety.  Dispense: 60 tablet; Refill: 2  6. Chronic midline low back pain with left-sided sciatica Continue topical analgesics as needed.  7. BMI 25.0-25.9,adult Continue heart healthy  diet. Light exercise encouraged.  8. Chronic midline low back pain without sciatica - gabapentin (NEURONTIN) 300 MG capsule; Take 1 capsule (300 mg total) by mouth 3 (three) times daily.  Dispense: 270 capsule; Refill: 1   Labs pending Health Maintenance reviewed Diet and exercise encouraged  Follow up plan: Follow up in 3 months or sooner if new complaints arise.   Mary-Margaret MHassell Done FNP ARobynn Pane RN, FNP student

## 2019-09-24 NOTE — Patient Instructions (Signed)

## 2019-09-25 LAB — CBC WITH DIFFERENTIAL/PLATELET
Basophils Absolute: 0 10*3/uL (ref 0.0–0.2)
Basos: 1 %
EOS (ABSOLUTE): 0.2 10*3/uL (ref 0.0–0.4)
Eos: 2 %
Hematocrit: 40.6 % (ref 34.0–46.6)
Hemoglobin: 13.1 g/dL (ref 11.1–15.9)
Immature Grans (Abs): 0 10*3/uL (ref 0.0–0.1)
Immature Granulocytes: 0 %
Lymphocytes Absolute: 1.8 10*3/uL (ref 0.7–3.1)
Lymphs: 23 %
MCH: 30.5 pg (ref 26.6–33.0)
MCHC: 32.3 g/dL (ref 31.5–35.7)
MCV: 95 fL (ref 79–97)
Monocytes Absolute: 0.6 10*3/uL (ref 0.1–0.9)
Monocytes: 7 %
Neutrophils Absolute: 5.3 10*3/uL (ref 1.4–7.0)
Neutrophils: 67 %
Platelets: 291 10*3/uL (ref 150–450)
RBC: 4.29 x10E6/uL (ref 3.77–5.28)
RDW: 12.8 % (ref 11.7–15.4)
WBC: 7.8 10*3/uL (ref 3.4–10.8)

## 2019-09-25 LAB — CMP14+EGFR
ALT: 8 IU/L (ref 0–32)
AST: 16 IU/L (ref 0–40)
Albumin/Globulin Ratio: 1.7 (ref 1.2–2.2)
Albumin: 4.3 g/dL (ref 3.8–4.8)
Alkaline Phosphatase: 72 IU/L (ref 39–117)
BUN/Creatinine Ratio: 14 (ref 12–28)
BUN: 15 mg/dL (ref 8–27)
Bilirubin Total: <0.2 mg/dL (ref 0.0–1.2)
CO2: 25 mmol/L (ref 20–29)
Calcium: 9.4 mg/dL (ref 8.7–10.3)
Chloride: 106 mmol/L (ref 96–106)
Creatinine, Ser: 1.1 mg/dL — ABNORMAL HIGH (ref 0.57–1.00)
GFR calc Af Amer: 60 mL/min/{1.73_m2} (ref >59)
GFR calc non Af Amer: 52 mL/min/{1.73_m2} — ABNORMAL LOW (ref >59)
Globulin, Total: 2.6 g/dL (ref 1.5–4.5)
Glucose: 99 mg/dL (ref 65–99)
Potassium: 4.9 mmol/L (ref 3.5–5.2)
Sodium: 141 mmol/L (ref 134–144)
Total Protein: 6.9 g/dL (ref 6.0–8.5)

## 2019-09-25 LAB — LIPID PANEL
Chol/HDL Ratio: 3.7 ratio (ref 0.0–4.4)
Cholesterol, Total: 139 mg/dL (ref 100–199)
HDL: 38 mg/dL — ABNORMAL LOW (ref >39)
LDL Chol Calc (NIH): 80 mg/dL (ref 0–99)
Triglycerides: 112 mg/dL (ref 0–149)
VLDL Cholesterol Cal: 21 mg/dL (ref 5–40)

## 2019-10-04 ENCOUNTER — Encounter: Payer: Self-pay | Admitting: Nurse Practitioner

## 2019-10-04 ENCOUNTER — Ambulatory Visit (INDEPENDENT_AMBULATORY_CARE_PROVIDER_SITE_OTHER): Payer: Medicare HMO | Admitting: Nurse Practitioner

## 2019-10-04 DIAGNOSIS — K5792 Diverticulitis of intestine, part unspecified, without perforation or abscess without bleeding: Secondary | ICD-10-CM | POA: Diagnosis not present

## 2019-10-04 MED ORDER — CIPROFLOXACIN HCL 500 MG PO TABS
500.0000 mg | ORAL_TABLET | Freq: Two times a day (BID) | ORAL | 0 refills | Status: DC
Start: 2019-10-04 — End: 2019-12-25

## 2019-10-04 NOTE — Progress Notes (Signed)
   Virtual Visit via telephone Note Due to COVID-19 pandemic this visit was conducted virtually. This visit type was conducted due to national recommendations for restrictions regarding the COVID-19 Pandemic (e.g. social distancing, sheltering in place) in an effort to limit this patient's exposure and mitigate transmission in our community. All issues noted in this document were discussed and addressed.  A physical exam was not performed with this format.  I connected with Wendy Arnold on 10/04/19 at 1:15 by telephone and verified that I am speaking with the correct person using two identifiers. Wendy Arnold is currently located at home and her handicap brother is currently with her during visit. The provider, Mary-Margaret Daphine Deutscher, FNP is located in their office at time of visit.  I discussed the limitations, risks, security and privacy concerns of performing an evaluation and management service by telephone and the availability of in person appointments. I also discussed with the patient that there may be a patient responsible charge related to this service. The patient expressed understanding and agreed to proceed.   History and Present Illness:   Chief Complaint: Abdominal Pain   HPI Patient calls in c/o flare up of diverticulitis. She has had diarrhea and left lower quadrant pain. She has history of diverticulitis   Review of Systems  Constitutional: Negative for chills and fever.  HENT: Negative.   Respiratory: Negative.   Cardiovascular: Negative.   Gastrointestinal: Positive for abdominal pain, diarrhea and nausea.  Neurological: Negative.   Psychiatric/Behavioral: Negative.   All other systems reviewed and are negative.    Observations/Objective: Alert and oriented- answers all questions appropriately moderate distress    Assessment and Plan: Wendy Arnold in today with chief complaint of Abdominal Pain   1. Diverticulitis Watch diet Force  fluids Patient allergic to flagyl so could only give cipro If no better in 48 hour need to go to the ER Meds ordered this encounter  Medications  . ciprofloxacin (CIPRO) 500 MG tablet    Sig: Take 1 tablet (500 mg total) by mouth 2 (two) times daily.    Dispense:  20 tablet    Refill:  0    Order Specific Question:   Supervising Provider    Answer:   Arville Care A [[id number]]         Follow Up Instructions: prn    I discussed the assessment and treatment plan with the patient. The patient was provided an opportunity to ask questions and all were answered. The patient agreed with the plan and demonstrated an understanding of the instructions.   The patient was advised to call back or seek an in-person evaluation if the symptoms worsen or if the condition fails to improve as anticipated.  The above assessment and management plan was discussed with the patient. The patient verbalized understanding of and has agreed to the management plan. Patient is aware to call the clinic if symptoms persist or worsen. Patient is aware when to return to the clinic for a follow-up visit. Patient educated on when it is appropriate to go to the emergency department.   Time call ended:  1:28  I provided 13 minutes of non-face-to-face time during this encounter.    Mary-Margaret Daphine Deutscher, FNP

## 2019-11-30 ENCOUNTER — Telehealth: Payer: Self-pay | Admitting: Nurse Practitioner

## 2019-11-30 NOTE — Telephone Encounter (Signed)
Had appointment in May and scheduled for August medical management.   Requesting antibiotic for infected gums?

## 2019-11-30 NOTE — Telephone Encounter (Signed)
Left detailed message on patients machine that she ntbs

## 2019-11-30 NOTE — Telephone Encounter (Signed)
Hs to be sen for antibiotics. Or she can see her dentist

## 2019-11-30 NOTE — Telephone Encounter (Signed)
  Prescription Request   Incoming Patient Call  11/30/2019  What symptoms do you have? Teeth hurt and pus coming out of gums  How long have you been sick? Month  Have you been seen for this problem? Not recently  If your provider decides to give you a prescription, which pharmacy would you like for it to be sent to? [facility]  Patient informed that this information will be sent to the clinical staff for review and that they should receive a follow up call.

## 2019-12-03 ENCOUNTER — Telehealth: Payer: Self-pay | Admitting: Nurse Practitioner

## 2019-12-25 ENCOUNTER — Encounter: Payer: Self-pay | Admitting: Nurse Practitioner

## 2019-12-25 ENCOUNTER — Ambulatory Visit (INDEPENDENT_AMBULATORY_CARE_PROVIDER_SITE_OTHER): Payer: Medicare HMO | Admitting: Nurse Practitioner

## 2019-12-25 ENCOUNTER — Other Ambulatory Visit: Payer: Self-pay

## 2019-12-25 VITALS — BP 147/92 | HR 88 | Temp 97.7°F | Resp 20 | Ht 68.0 in | Wt 163.0 lb

## 2019-12-25 DIAGNOSIS — F5101 Primary insomnia: Secondary | ICD-10-CM

## 2019-12-25 DIAGNOSIS — I10 Essential (primary) hypertension: Secondary | ICD-10-CM

## 2019-12-25 DIAGNOSIS — Z148 Genetic carrier of other disease: Secondary | ICD-10-CM

## 2019-12-25 DIAGNOSIS — M545 Low back pain: Secondary | ICD-10-CM

## 2019-12-25 DIAGNOSIS — F411 Generalized anxiety disorder: Secondary | ICD-10-CM

## 2019-12-25 DIAGNOSIS — E8801 Alpha-1-antitrypsin deficiency: Secondary | ICD-10-CM | POA: Diagnosis not present

## 2019-12-25 DIAGNOSIS — E782 Mixed hyperlipidemia: Secondary | ICD-10-CM | POA: Diagnosis not present

## 2019-12-25 DIAGNOSIS — G8929 Other chronic pain: Secondary | ICD-10-CM | POA: Diagnosis not present

## 2019-12-25 DIAGNOSIS — M5442 Lumbago with sciatica, left side: Secondary | ICD-10-CM

## 2019-12-25 MED ORDER — AMLODIPINE BESY-BENAZEPRIL HCL 10-20 MG PO CAPS: 1.0000 | ORAL_CAPSULE | Freq: Every day | ORAL | 1 refills | Status: DC

## 2019-12-25 MED ORDER — GABAPENTIN 300 MG PO CAPS: 300.0000 mg | ORAL_CAPSULE | Freq: Three times a day (TID) | ORAL | 1 refills | Status: DC

## 2019-12-25 MED ORDER — TRAZODONE HCL 150 MG PO TABS: 150.0000 mg | ORAL_TABLET | Freq: Every day | ORAL | 1 refills | Status: DC

## 2019-12-25 MED ORDER — ALPRAZOLAM 0.5 MG PO TABS: 0.5000 mg | ORAL_TABLET | Freq: Two times a day (BID) | ORAL | 5 refills | Status: DC | PRN

## 2019-12-25 NOTE — Progress Notes (Signed)
Subjective:    Patient ID: Wendy Arnold, female    DOB: [birth year], 69 y.o.   MRN: [id number]   Chief Complaint: Medical Management of Chronic Issues    HPI:  1. Essential hypertension No c/o chest pain, sob or headache. Does not check blood pressure at home. BP Readings from Last 3 Encounters:  12/25/19 (!) 147/92  09/24/19 114/68  06/25/19 127/80     2. Mixed hyperlipidemia Doe snot watch diet and does very little exercise if any. She stopped taing her cholesterol meds because she wanted to try OTC meds. Lab Results  Component Value Date   CHOL 139 09/24/2019   HDL 38 (L) 09/24/2019   LDLCALC 80 09/24/2019   TRIG 112 09/24/2019   CHOLHDL 3.7 09/24/2019   The 10-year ASCVD risk score Denman George DC Jr., et al., 2013) is: 19.6%   Values used to calculate the score:     Age: 48 years     Sex: Female     Is Non-Hispanic African American: No     Diabetic: No     Tobacco smoker: Yes     Systolic Blood Pressure: 147 mmHg     Is BP treated: Yes     HDL Cholesterol: 38 mg/dL     Total Cholesterol: 139 mg/dL   3. Chronic midline low back pain with left-sided sciatica Has chronic back pain but just uses tylenol or motrin OTC for it.  4. GAD (generalized anxiety disorder) Is on xanax daily and cannot function without it. GAD 7 : Generalized Anxiety Score 09/24/2019 06/25/2019  Nervous, Anxious, on Edge 2 2  Control/stop worrying 3 3  Worry too much - different things 3 3  Trouble relaxing 1 2  Restless 0 2  Easily annoyed or irritable 1 2  Afraid - awful might happen 2 0  Total GAD 7 Score 12 14  Anxiety Difficulty Not difficult at all Somewhat difficult      5. Alpha-1-antitrypsin deficiency carrier No symptoms  6. Primary insomnia Has trouble sleeping. Takes trazadone at night but she only sleeps about 3-4 hours at a time.  Wt Readings from Last 3 Encounters:  12/25/19 163 lb (73.9 kg)  09/24/19 169 lb (76.7 kg)  06/25/19 168 lb (76.2 kg)     Outpatient  Encounter Medications as of 12/25/2019  Medication Sig  . ALPRAZolam (XANAX) 0.5 MG tablet Take 1 tablet (0.5 mg total) by mouth 2 (two) times daily as needed for anxiety.  Marland Kitchen amLODipine-benazepril (LOTREL) 10-20 MG capsule Take 1 capsule by mouth daily.  Marland Kitchen aspirin EC 81 MG tablet Take 81 mg by mouth at bedtime.   . gabapentin (NEURONTIN) 300 MG capsule Take 1 capsule (300 mg total) by mouth 3 (three) times daily.  Marland Kitchen ibuprofen (ADVIL) 800 MG tablet Take 1 tablet (800 mg total) by mouth every 8 (eight) hours as needed.  . Multiple Vitamins-Minerals (ALIVE WOMENS 50+ PO) Take by mouth.  . Omega-3 Fatty Acids (OMEGA 3 500 PO) Take by mouth.  . Red Yeast Rice Extract (RED YEAST RICE PO) Take by mouth.  . rosuvastatin (CRESTOR) 10 MG tablet Take 1 tablet (10 mg total) by mouth daily.  . traZODone (DESYREL) 150 MG tablet Take 1 tablet (150 mg total) by mouth at bedtime. *May take 150mg  as prescribed*     Past Surgical History:  Procedure Laterality Date  . ABDOMINAL HYSTERECTOMY    . BACK SURGERY    . SINUS ENDO WITH FUSION    .  TOE SURGERY      Family History  Problem Relation Age of Onset  . Heart failure Mother   . Cancer Father   . Cancer Brother   . Cancer Brother   . 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  Eyes: Negative for pain.  Respiratory: Negative for shortness of breath.   Cardiovascular: Negative for chest pain, palpitations and leg swelling.  Gastrointestinal: Negative for abdominal pain.  Endocrine: Negative for polydipsia.  Skin: Negative for rash.  Neurological: Negative for dizziness, weakness and headaches.  Hematological: Does not bruise/bleed easily.  All other systems reviewed and are negative.      Objective:   Physical Exam Vitals  and nursing note reviewed.  Constitutional:      General: She is not in acute distress.    Appearance: Normal appearance. She is well-developed.  HENT:     Head: Normocephalic.     Nose: Nose normal.  Eyes:     Pupils: Pupils are equal, round, and reactive to light.  Neck:     Vascular: No carotid bruit or JVD.  Cardiovascular:     Rate and Rhythm: Normal rate and regular rhythm.     Heart sounds: Normal heart sounds.  Pulmonary:     Effort: Pulmonary effort is normal. No respiratory distress.     Breath sounds: Normal breath sounds. No wheezing or rales.  Chest:     Chest wall: No tenderness.  Abdominal:     General: Bowel sounds are normal. There is no distension or abdominal bruit.     Palpations: Abdomen is soft. There is no hepatomegaly, splenomegaly, mass or pulsatile mass.     Tenderness: There is no abdominal tenderness.  Musculoskeletal:        General: Normal range of motion.     Cervical back: Normal range of motion and neck supple.  Lymphadenopathy:     Cervical: No cervical adenopathy.  Skin:    General: Skin is warm and dry.  Neurological:     Mental Status: She is alert and oriented to person, place, and time.     Deep Tendon Reflexes: Reflexes are normal and symmetric.  Psychiatric:        Behavior: Behavior normal.        Thought Content: Thought content normal.        Judgment: Judgment normal.     BP (!) 147/92   Pulse 88   Temp 97.7 F (36.5 C) (Temporal)   Resp 20   Ht 5\' 8"  (1.727 m)   Wt 163 lb (73.9 kg)   SpO2 94%   BMI 24.78 kg/m       Assessment & Plan:  Wendy Arnold comes in today with chief complaint of Medical Management of Chronic Issues   Diagnosis and orders addressed:  1. Essential hypertension Low sdoium diet - amLODipine-benazepril (LOTREL) 10-20 MG capsule; Take 1 capsule by mouth daily.  Dispense: 90 capsule; Refill: 1  2. Mixed hyperlipidemia Low fat diet  3. Chronic midline low back pain with left-sided  sciatica Moist heat rest  4. GAD (generalized anxiety disorder) Stress management - ALPRAZolam (XANAX) 0.5 MG tablet; Take 1 tablet (0.5 mg total) by mouth 2 (two) times daily as needed for anxiety.  Dispense: 60 tablet; Refill: 5  5. Alpha-1-antitrypsin deficiency carrier  6. Primary insomnia Bedtime routine Try not to map during the day - traZODone (DESYREL) 150 MG tablet; Take 1 tablet (150 mg total) by mouth at bedtime. *May take 150mg  as prescribed*  Dispense: 90 tablet; Refill: 1  7. Chronic midline low back pain without sciatica - gabapentin (NEURONTIN) 300 MG capsule; Take 1 capsule (300 mg total) by mouth 3 (three) times daily.  Dispense: 270 capsule; Refill: 1   Labs pending Health Maintenance reviewed Diet and exercise encouraged  Follow up plan: 6 months   Mary-Margaret , FNP

## 2019-12-25 NOTE — Patient Instructions (Signed)
Stress, Adult Stress is a normal reaction to life events. Stress is what you feel when life demands more than you are used to, or more than you think you can handle. Some stress can be useful, such as studying for a test or meeting a deadline at work. Stress that occurs too often or for too long can cause problems. It can affect your emotional health and interfere with relationships and normal daily activities. Too much stress can weaken your body's defense system (immune system) and increase your risk for physical illness. If you already have a medical problem, stress can make it worse. What are the causes? All sorts of life events can cause stress. An event that causes stress for one person may not be stressful for another person. Major life events, whether positive or negative, commonly cause stress. Examples include:  Losing a job or starting a new job.  Losing a loved one.  Moving to a new town or home.  Getting married or divorced.  Having a baby.  Getting injured or sick. Less obvious life events can also cause stress, especially if they occur day after day or in combination with each other. Examples include:  Working long hours.  Driving in traffic.  Caring for children.  Being in debt.  Being in a difficult relationship. What are the signs or symptoms? Stress can cause emotional symptoms, including:  Anxiety. This is feeling worried, afraid, on edge, overwhelmed, or out of control.  Anger, including irritation or impatience.  Depression. This is feeling sad, down, helpless, or guilty.  Trouble focusing, remembering, or making decisions. Stress can cause physical symptoms, including:  Aches and pains. These may affect your head, neck, back, stomach, or other areas of your body.  Tight muscles or a clenched jaw.  Low energy.  Trouble sleeping. Stress can cause unhealthy behaviors, including:  Eating to feel better (overeating) or skipping meals.  Working too  much or putting off tasks.  Smoking, drinking alcohol, or using drugs to feel better. How is this diagnosed? Stress is diagnosed through an assessment by your health care provider. He or she may diagnose this condition based on:  Your symptoms and any stressful life events.  Your medical history.  Tests to rule out other causes of your symptoms. Depending on your condition, your health care provider may refer you to a specialist for further evaluation. How is this treated?  Stress management techniques are the recommended treatment for stress. Medicine is not typically recommended for the treatment of stress. Techniques to reduce your reaction to stressful life events include:  Stress identification. Monitor yourself for symptoms of stress and identify what causes stress for you. These skills may help you to avoid or prepare for stressful events.  Time management. Set your priorities, keep a calendar of events, and learn to say no. Taking these actions can help you avoid making too many commitments. Techniques for coping with stress include:  Rethinking the problem. Try to think realistically about stressful events rather than ignoring them or overreacting. Try to find the positives in a stressful situation rather than focusing on the negatives.  Exercise. Physical exercise can release both physical and emotional tension. The key is to find a form of exercise that you enjoy and do it regularly.  Relaxation techniques. These relax the body and mind. The key is to find one or more that you enjoy and use the techniques regularly. Examples include: ? Meditation, deep breathing, or progressive relaxation techniques. ? Yoga or   tai chi. ? Biofeedback, mindfulness techniques, or journaling. ? Listening to music, being out in nature, or participating in other hobbies.  Practicing a healthy lifestyle. Eat a balanced diet, drink plenty of water, limit or avoid caffeine, and get plenty of  sleep.  Having a strong support network. Spend time with family, friends, or other people you enjoy being around. Express your feelings and talk things over with someone you trust. Counseling or talk therapy with a mental health professional may be helpful if you are having trouble managing stress on your own. Follow these instructions at home: Lifestyle   Avoid drugs.  Do not use any products that contain nicotine or tobacco, such as cigarettes, e-cigarettes, and chewing tobacco. If you need help quitting, ask your health care provider.  Limit alcohol intake to no more than 1 drink a day for nonpregnant women and 2 drinks a day for men. One drink equals 12 oz of beer, 5 oz of wine, or 1 oz of hard liquor  Do not use alcohol or drugs to relax.  Eat a balanced diet that includes fresh fruits and vegetables, whole grains, lean meats, fish, eggs, and beans, and low-fat dairy. Avoid processed foods and foods high in added fat, sugar, and salt.  Exercise at least 30 minutes on 5 or more days each week.  Get 7-8 hours of sleep each night. General instructions   Practice stress management techniques as discussed with your health care provider.  Drink enough fluid to keep your urine clear or pale yellow.  Take over-the-counter and prescription medicines only as told by your health care provider.  Keep all follow-up visits as told by your health care provider. This is important. Contact a health care provider if:  Your symptoms get worse.  You have new symptoms.  You feel overwhelmed by your problems and can no longer manage them on your own. Get help right away if:  You have thoughts of hurting yourself or others. If you ever feel like you may hurt yourself or others, or have thoughts about taking your own life, get help right away. You can go to your nearest emergency department or call:  Your local emergency services (911 in the [place]  A suicide crisis helpline, such as the  [place] at (316) 250-6172. This is open 24 hours a day. Summary  Stress is a normal reaction to life events. It can cause problems if it happens too often or for too long.  Practicing stress management techniques is the best way to treat stress.  Counseling or talk therapy with a mental health professional may be helpful if you are having trouble managing stress on your own. This information is not intended to replace advice given to you by your health care provider. Make sure you discuss any questions you have with your health care provider. Document Revised: 12/08/2018 Document Reviewed: 06/30/2016 Elsevier Patient Education  [place].

## 2020-03-31 ENCOUNTER — Ambulatory Visit (INDEPENDENT_AMBULATORY_CARE_PROVIDER_SITE_OTHER): Payer: Medicare HMO

## 2020-03-31 DIAGNOSIS — Z23 Encounter for immunization: Secondary | ICD-10-CM

## 2020-04-22 ENCOUNTER — Other Ambulatory Visit: Payer: Self-pay | Admitting: Nurse Practitioner

## 2020-04-22 DIAGNOSIS — E782 Mixed hyperlipidemia: Secondary | ICD-10-CM

## 2020-04-25 DIAGNOSIS — R69 Illness, unspecified: Secondary | ICD-10-CM | POA: Diagnosis not present

## 2020-06-19 ENCOUNTER — Other Ambulatory Visit: Payer: Self-pay | Admitting: Nurse Practitioner

## 2020-06-19 DIAGNOSIS — F411 Generalized anxiety disorder: Secondary | ICD-10-CM

## 2020-06-20 ENCOUNTER — Telehealth: Payer: Self-pay | Admitting: Nurse Practitioner

## 2020-06-20 DIAGNOSIS — F411 Generalized anxiety disorder: Secondary | ICD-10-CM

## 2020-06-20 MED ORDER — ALPRAZOLAM 0.5 MG PO TABS: 0.5000 mg | ORAL_TABLET | Freq: Two times a day (BID) | ORAL | 0 refills | Status: DC | PRN

## 2020-06-20 NOTE — Telephone Encounter (Signed)
Pt takesAlprazolam like she is suppose to, last appt was 8/3 & next appt is 06/26/20 next week. Months in between had 31 days in it, so this has her short 5 days till her appt. Can enough be sent in till her appt

## 2020-06-20 NOTE — Telephone Encounter (Signed)
°  Prescription Request  06/20/2020  What is the name of the medication or equipment? Xanax   Have you contacted your pharmacy to request a refill? (if applicable) yes  Which pharmacy would you like this sent to? [facility]   Patient notified that their request is being sent to the clinical staff for review and that they should receive a response within 2 business days.

## 2020-06-26 ENCOUNTER — Encounter: Payer: Self-pay | Admitting: Nurse Practitioner

## 2020-06-26 ENCOUNTER — Other Ambulatory Visit: Payer: Self-pay

## 2020-06-26 ENCOUNTER — Ambulatory Visit (INDEPENDENT_AMBULATORY_CARE_PROVIDER_SITE_OTHER): Payer: Medicare HMO | Admitting: Nurse Practitioner

## 2020-06-26 VITALS — BP 113/78 | Temp 97.7°F | Ht 68.0 in | Wt 164.2 lb

## 2020-06-26 DIAGNOSIS — I10 Essential (primary) hypertension: Secondary | ICD-10-CM | POA: Diagnosis not present

## 2020-06-26 DIAGNOSIS — M545 Low back pain, unspecified: Secondary | ICD-10-CM | POA: Diagnosis not present

## 2020-06-26 DIAGNOSIS — M5442 Lumbago with sciatica, left side: Secondary | ICD-10-CM

## 2020-06-26 DIAGNOSIS — E782 Mixed hyperlipidemia: Secondary | ICD-10-CM | POA: Diagnosis not present

## 2020-06-26 DIAGNOSIS — F411 Generalized anxiety disorder: Secondary | ICD-10-CM | POA: Diagnosis not present

## 2020-06-26 DIAGNOSIS — G8929 Other chronic pain: Secondary | ICD-10-CM

## 2020-06-26 DIAGNOSIS — F5101 Primary insomnia: Secondary | ICD-10-CM | POA: Diagnosis not present

## 2020-06-26 MED ORDER — ROSUVASTATIN CALCIUM 10 MG PO TABS
10.0000 mg | ORAL_TABLET | Freq: Every day | ORAL | 1 refills | Status: DC
Start: 2020-06-26 — End: 2020-12-19

## 2020-06-26 MED ORDER — AMLODIPINE BESY-BENAZEPRIL HCL 10-20 MG PO CAPS: 1.0000 | ORAL_CAPSULE | Freq: Every day | ORAL | 1 refills | Status: DC

## 2020-06-26 MED ORDER — ALPRAZOLAM 0.5 MG PO TABS: 0.5000 mg | ORAL_TABLET | Freq: Two times a day (BID) | ORAL | 5 refills | Status: DC | PRN

## 2020-06-26 MED ORDER — GABAPENTIN 300 MG PO CAPS: 300.0000 mg | ORAL_CAPSULE | Freq: Three times a day (TID) | ORAL | 1 refills | Status: DC

## 2020-06-26 NOTE — Progress Notes (Signed)
Subjective:    Patient ID: Wendy Arnold, female    DOB: 1951/01/09, 70 y.o.   MRN: [id number]   Chief Complaint: Medical Management of Chronic Issues    HPI:  1. Essential hypertension No c/o chest pain, sob or headache. Does not check blood pressure at home. BP Readings from Last 3 Encounters:  12/25/19 (!) 147/92  09/24/19 114/68  06/25/19 127/80     2. Mixed hyperlipidemia Does not watch diet and does no exercsie Lab Results  Component Value Date   CHOL 139 09/24/2019   HDL 38 (L) 09/24/2019   LDLCALC 80 09/24/2019   TRIG 112 09/24/2019   CHOLHDL 3.7 09/24/2019     3. GAD (generalized anxiety disorder) Stays anxious is on xanax BID. She stays very anxious.  GAD 7 : Generalized Anxiety Score 06/26/2020 09/24/2019 06/25/2019  Nervous, Anxious, on Edge '3 2 2  ' Control/stop worrying '2 3 3  ' Worry too much - different things '2 3 3  ' Trouble relaxing '1 1 2  ' Restless 0 0 2  Easily annoyed or irritable '1 1 2  ' Afraid - awful might happen 2 2 0  Total GAD 7 Score '11 12 14  ' Anxiety Difficulty Somewhat difficult Not difficult at all Somewhat difficult       4. Primary insomnia Is on trazadone nightly. Sleeps about 4-5 hours a night  5. Chronic midline low back pain with left-sided sciatica She has chronic back pain that she takes motrin for. Motrin helps. Rate [place] 6/10 today.    Outpatient Encounter Medications as of 06/26/2020  Medication Sig  . ALPRAZolam (XANAX) 0.5 MG tablet Take 1 tablet (0.5 mg total) by mouth 2 (two) times daily as needed for anxiety.  Marland Kitchen amLODipine-benazepril (LOTREL) 10-20 MG capsule Take 1 capsule by mouth daily.  Marland Kitchen aspirin EC 81 MG tablet Take 81 mg by mouth at bedtime.   . gabapentin (NEURONTIN) 300 MG capsule Take 1 capsule (300 mg total) by mouth 3 (three) times daily.  Marland Kitchen ibuprofen (ADVIL) 800 MG tablet Take 1 tablet (800 mg total) by mouth every 8 (eight) hours as needed.  . Multiple Vitamins-Minerals (ALIVE WOMENS 50+ PO) Take by  mouth.  . rosuvastatin (CRESTOR) 10 MG tablet TAKE 1 TABLET EVERY DAY  . traZODone (DESYREL) 150 MG tablet Take 1 tablet (150 mg total) by mouth at bedtime. *May take 157m as prescribed*   Past Surgical History:  Procedure Laterality Date  . ABDOMINAL HYSTERECTOMY    . BACK SURGERY    . SINUS ENDO WITH FUSION    . TOE SURGERY      Family History  Problem Relation Age of Onset  . Heart failure Mother   . Cancer Father   . Cancer Brother   . Cancer Brother   . 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  Eyes: Negative for pain.  Respiratory: Negative for shortness of breath.   Cardiovascular: Negative for chest pain, palpitations and leg swelling.  Gastrointestinal: Negative for abdominal pain.  Endocrine: Negative for polydipsia.  Skin: Negative for rash.  Neurological: Negative for dizziness, weakness and headaches.  Hematological: Does not bruise/bleed easily.  All other systems reviewed and are negative.      Objective:   Physical Exam Vitals and nursing note reviewed.  Constitutional:      General: She is  not in acute distress.    Appearance: Normal appearance. She is well-developed and well-nourished.  HENT:     Head: Normocephalic.     Nose: Nose normal.     Mouth/Throat:     Mouth: Oropharynx is clear and moist.  Eyes:     Extraocular Movements: EOM normal.     Pupils: Pupils are equal, round, and reactive to light.  Neck:     Vascular: No carotid bruit or JVD.  Cardiovascular:     Rate and Rhythm: Normal rate and regular rhythm.     Pulses: Intact distal pulses.     Heart sounds: Normal heart sounds.  Pulmonary:     Effort: Pulmonary effort is normal. No respiratory distress.     Breath sounds: Normal breath sounds. No wheezing or rales.  Chest:     Chest wall: No tenderness.  Abdominal:      General: Bowel sounds are normal. There is no distension or abdominal bruit. Aorta is normal.     Palpations: Abdomen is soft. There is no hepatomegaly, splenomegaly, mass or pulsatile mass.     Tenderness: There is no abdominal tenderness.  Musculoskeletal:        General: No edema. Normal range of motion.     Cervical back: Normal range of motion and neck supple.  Lymphadenopathy:     Cervical: No cervical adenopathy.  Skin:    General: Skin is warm and dry.  Neurological:     Mental Status: She is alert and oriented to person, place, and time.     Deep Tendon Reflexes: Reflexes are normal and symmetric.  Psychiatric:        Mood and Affect: Mood and affect normal.        Behavior: Behavior normal.        Thought Content: Thought content normal.        Judgment: Judgment normal.     BP 113/78   Temp 97.7 F (36.5 C) (Temporal)   Ht '5\' 8"'  (1.727 m)   Wt 164 lb 3.2 oz (74.5 kg)   BMI 24.97 kg/m         Assessment & Plan:  [name] comes in today with chief complaint of Medical Management of Chronic Issues   Diagnosis and orders addressed:  1. Essential hypertension Low sodium diet - amLODipine-benazepril (LOTREL) 10-20 MG capsule; Take 1 capsule by mouth daily.  Dispense: 90 capsule; Refill: 1 - CBC with Differential/Platelet - CMP14+EGFR  2. Mixed hyperlipidemia Low fat diet - rosuvastatin (CRESTOR) 10 MG tablet; Take 1 tablet (10 mg total) by mouth daily.  Dispense: 90 tablet; Refill: 1 - Lipid panel  3. GAD (generalized anxiety disorder) Stress management - ALPRAZolam (XANAX) 0.5 MG tablet; Take 1 tablet (0.5 mg total) by mouth 2 (two) times daily as needed for anxiety.  Dispense: 60 tablet; Refill: 5  4. Primary insomnia Bedtime routine  5. Chronic midline low back pain without sciatica *moist heat rest - gabapentin (NEURONTIN) 300 MG capsule; Take 1 capsule (300 mg total) by mouth 3 (three) times daily.  Dispense: 270 capsule; Refill:  1   Labs pending Health Maintenance reviewed Diet and exercise encouraged  Follow up plan: 6 months   Mary-Margaret Hassell Done, FNP

## 2020-06-26 NOTE — Patient Instructions (Signed)
Fall Prevention in the Home, Adult Falls can cause injuries and can happen to people of all ages. There are many things you can do to make your home safe and to help prevent falls. Ask for help when making these changes. What actions can I take to prevent falls? General Instructions  Use good lighting in all rooms. Replace any light bulbs that burn out.  Turn on the lights in dark areas. Use night-lights.  Keep items that you use often in easy-to-reach places. Lower the shelves around your home if needed.  Set up your furniture so you have a clear path. Avoid moving your furniture around.  Do not have throw rugs or other things on the floor that can make you trip.  Avoid walking on wet floors.  If any of your floors are uneven, fix them.  Add color or contrast paint or tape to clearly mark and help you see: ? Grab bars or handrails. ? First and last steps of staircases. ? Where the edge of each step is.  If you use a stepladder: ? Make sure that it is fully opened. Do not climb a closed stepladder. ? Make sure the sides of the stepladder are locked in place. ? Ask someone to hold the stepladder while you use it.  Know where your pets are when moving through your home. What can I do in the bathroom?  Keep the floor dry. Clean up any water on the floor right away.  Remove soap buildup in the tub or shower.  Use nonskid mats or decals on the floor of the tub or shower.  Attach bath mats securely with double-sided, nonslip rug tape.  If you need to sit down in the shower, use a plastic, nonslip stool.  Install grab bars by the toilet and in the tub and shower. Do not use towel bars as grab bars.      What can I do in the bedroom?  Make sure that you have a light by your bed that is easy to reach.  Do not use any sheets or blankets for your bed that hang to the floor.  Have a firm chair with side arms that you can use for support when you get dressed. What can I do in  the kitchen?  Clean up any spills right away.  If you need to reach something above you, use a step stool with a grab bar.  Keep electrical cords out of the way.  Do not use floor polish or wax that makes floors slippery. What can I do with my stairs?  Do not leave any items on the stairs.  Make sure that you have a light switch at the top and the bottom of the stairs.  Make sure that there are handrails on both sides of the stairs. Fix handrails that are broken or loose.  Install nonslip stair treads on all your stairs.  Avoid having throw rugs at the top or bottom of the stairs.  Choose a carpet that does not hide the edge of the steps on the stairs.  Check carpeting to make sure that it is firmly attached to the stairs. Fix carpet that is loose or worn. What can I do on the outside of my home?  Use bright outdoor lighting.  Fix the edges of walkways and driveways and fix any cracks.  Remove anything that might make you trip as you walk through a door, such as a raised step or threshold.  Trim any   bushes or trees on paths to your home.  Check to see if handrails are loose or broken and that both sides of all steps have handrails.  Install guardrails along the edges of any raised decks and porches.  Clear paths of anything that can make you trip, such as tools or rocks.  Have leaves, snow, or ice cleared regularly.  Use sand or salt on paths during winter.  Clean up any spills in your garage right away. This includes grease or oil spills. What other actions can I take?  Wear shoes that: ? Have a low heel. Do not wear high heels. ? Have rubber bottoms. ? Feel good on your feet and fit well. ? Are closed at the toe. Do not wear open-toe sandals.  Use tools that help you move around if needed. These include: ? Canes. ? Walkers. ? Scooters. ? Crutches.  Review your medicines with your doctor. Some medicines can make you feel dizzy. This can increase your chance  of falling. Ask your doctor what else you can do to help prevent falls. Where to find more information  Centers for Disease Control and Prevention, [employer]: www.cdc.gov  National Institute on Aging: www.nia.nih.gov Contact a doctor if:  You are afraid of falling at home.  You feel weak, drowsy, or dizzy at home.  You fall at home. Summary  There are many simple things that you can do to make your home safe and to help prevent falls.  Ways to make your home safe include removing things that can make you trip and installing grab bars in the bathroom.  Ask for help when making these changes in your home. This information is not intended to replace advice given to you by your health care provider. Make sure you discuss any questions you have with your health care provider. Document Revised: 12/12/2019 Document Reviewed: 12/12/2019 Elsevier Patient Education  2021 Elsevier Inc.  

## 2020-06-27 LAB — CMP14+EGFR
ALT: 10 IU/L (ref 0–32)
AST: 18 IU/L (ref 0–40)
Albumin/Globulin Ratio: 1.4 (ref 1.2–2.2)
Albumin: 4.3 g/dL (ref 3.8–4.8)
Alkaline Phosphatase: 81 IU/L (ref 44–121)
BUN/Creatinine Ratio: 21 (ref 12–28)
BUN: 23 mg/dL (ref 8–27)
Bilirubin Total: 0.2 mg/dL (ref 0.0–1.2)
CO2: 23 mmol/L (ref 20–29)
Calcium: 9.5 mg/dL (ref 8.7–10.3)
Chloride: 102 mmol/L (ref 96–106)
Creatinine, Ser: 1.11 mg/dL — ABNORMAL HIGH (ref 0.57–1.00)
GFR calc Af Amer: 59 mL/min/{1.73_m2} — ABNORMAL LOW (ref >59)
GFR calc non Af Amer: 52 mL/min/{1.73_m2} — ABNORMAL LOW (ref >59)
Globulin, Total: 3.1 g/dL (ref 1.5–4.5)
Glucose: 88 mg/dL (ref 65–99)
Potassium: 4.9 mmol/L (ref 3.5–5.2)
Sodium: 137 mmol/L (ref 134–144)
Total Protein: 7.4 g/dL (ref 6.0–8.5)

## 2020-06-27 LAB — CBC WITH DIFFERENTIAL/PLATELET
Basophils Absolute: 0.1 10*3/uL (ref 0.0–0.2)
Basos: 1 %
EOS (ABSOLUTE): 0.3 10*3/uL (ref 0.0–0.4)
Eos: 4 %
Hematocrit: 40.9 % (ref 34.0–46.6)
Hemoglobin: 13.4 g/dL (ref 11.1–15.9)
Immature Grans (Abs): 0 10*3/uL (ref 0.0–0.1)
Immature Granulocytes: 0 %
Lymphocytes Absolute: 1.6 10*3/uL (ref 0.7–3.1)
Lymphs: 28 %
MCH: 30.2 pg (ref 26.6–33.0)
MCHC: 32.8 g/dL (ref 31.5–35.7)
MCV: 92 fL (ref 79–97)
Monocytes Absolute: 0.7 10*3/uL (ref 0.1–0.9)
Monocytes: 12 %
Neutrophils Absolute: 3.3 10*3/uL (ref 1.4–7.0)
Neutrophils: 55 %
Platelets: 276 10*3/uL (ref 150–450)
RBC: 4.43 x10E6/uL (ref 3.77–5.28)
RDW: 12.2 % (ref 11.7–15.4)
WBC: 6 10*3/uL (ref 3.4–10.8)

## 2020-06-27 LAB — LIPID PANEL
Chol/HDL Ratio: 3.8 ratio (ref 0.0–4.4)
Cholesterol, Total: 173 mg/dL (ref 100–199)
HDL: 46 mg/dL (ref >39)
LDL Chol Calc (NIH): 107 mg/dL — ABNORMAL HIGH (ref 0–99)
Triglycerides: 109 mg/dL (ref 0–149)
VLDL Cholesterol Cal: 20 mg/dL (ref 5–40)

## 2020-07-23 ENCOUNTER — Ambulatory Visit (INDEPENDENT_AMBULATORY_CARE_PROVIDER_SITE_OTHER): Payer: Medicare HMO | Admitting: Nurse Practitioner

## 2020-07-23 ENCOUNTER — Encounter: Payer: Self-pay | Admitting: Nurse Practitioner

## 2020-07-23 DIAGNOSIS — H10233 Serous conjunctivitis, except viral, bilateral: Secondary | ICD-10-CM | POA: Diagnosis not present

## 2020-07-23 MED ORDER — BACITRACIN-POLYMYXIN B 500-10000 UNIT/GM OP OINT: 1.0000 "application " | TOPICAL_OINTMENT | Freq: Two times a day (BID) | OPHTHALMIC | 0 refills | Status: DC

## 2020-07-23 NOTE — Progress Notes (Signed)
   Virtual Visit via telephone Note Due to COVID-19 pandemic this visit was conducted virtually. This visit type was conducted due to national recommendations for restrictions regarding the COVID-19 Pandemic (e.g. social distancing, sheltering in place) in an effort to limit this patient's exposure and mitigate transmission in our community. All issues noted in this document were discussed and addressed.  A physical exam was not performed with this format.  I connected with Wendy Arnold on 07/23/20 at  8:30 AM by telephone and verified that I am speaking with the correct person using two identifiers. Wendy Arnold is currently located at home during visit. The provider, Daryll Drown, NP is located in their office at time of visit.  I discussed the limitations, risks, security and privacy concerns of performing an evaluation and management service by telephone and the availability of in person appointments. I also discussed with the patient that there may be a patient responsible charge related to this service. The patient expressed understanding and agreed to proceed.   History and Present Illness:  Conjunctivitis  The current episode started 3 to 5 days ago. The onset was gradual. The problem has been gradually worsening. The problem is moderate. Nothing relieves the symptoms. Nothing aggravates the symptoms. Associated symptoms include eye itching, eye discharge, eye pain and eye redness. Pertinent negatives include no fever, no decreased vision, no double vision, no abdominal pain, no congestion, no ear pain, no headaches, no cough and no URI. The eye pain is moderate. The eyelid exhibits redness and swelling. She has been eating and drinking normally.      Review of Systems  Constitutional: Negative for fever.  HENT: Negative for congestion and ear pain.   Eyes: Positive for pain, discharge, redness and itching. Negative for double vision.  Respiratory: Negative for cough.    Gastrointestinal: Negative for abdominal pain.  Neurological: Negative for headaches.  All other systems reviewed and are negative.    Observations/Objective: Televisit-patient did not sound to be in distress  Assessment and Plan:  Serous conjunctivitis of both eyes Worsening symptoms of serous conjunctivitis of both eyes in the last 4 days.  Patient reports pain as as moderate with sandy-like feeling, swollen eyelids and erythema.  She denies ear pain, headache, nausea, fever or chills.  Encourage patient to use clean hands, warm washcloth and compress.  Motrin or Tylenol for pain, Polysporin started twice daily for 10 days. Follow-up with worsening or unresolved symptoms  Follow Up Instructions: Follow-up with unresolved symptoms    I discussed the assessment and treatment plan with the patient. The patient was provided an opportunity to ask questions and all were answered. The patient agreed with the plan and demonstrated an understanding of the instructions.   The patient was advised to call back or seek an in-person evaluation if the symptoms worsen or if the condition fails to improve as anticipated.  The above assessment and management plan was discussed with the patient. The patient verbalized understanding of and has agreed to the management plan. Patient is aware to call the clinic if symptoms persist or worsen. Patient is aware when to return to the clinic for a follow-up visit. Patient educated on when it is appropriate to go to the emergency department.   Time call ended: 8:37 AM  I provided 7 minutes of non-face-to-face time during this encounter.    Daryll Drown, NP

## 2020-07-23 NOTE — Assessment & Plan Note (Signed)
Worsening symptoms of serous conjunctivitis of both eyes in the last 4 days.  Patient reports pain as as moderate with sandy-like feeling, swollen eyelids and erythema.  She denies ear pain, headache, nausea, fever or chills.  Encourage patient to use clean hands, warm washcloth and compress.  Motrin or Tylenol for pain, Polysporin started twice daily for 10 days. Follow-up with worsening or unresolved symptoms

## 2020-08-07 ENCOUNTER — Ambulatory Visit (INDEPENDENT_AMBULATORY_CARE_PROVIDER_SITE_OTHER): Payer: Medicare HMO | Admitting: Nurse Practitioner

## 2020-08-07 ENCOUNTER — Telehealth: Payer: Self-pay | Admitting: *Deleted

## 2020-08-07 ENCOUNTER — Other Ambulatory Visit: Payer: Self-pay

## 2020-08-07 ENCOUNTER — Encounter: Payer: Self-pay | Admitting: Nurse Practitioner

## 2020-08-07 VITALS — BP 137/86 | HR 67 | Temp 97.2°F | Resp 20 | Ht 68.0 in | Wt 164.0 lb

## 2020-08-07 DIAGNOSIS — H60311 Diffuse otitis externa, right ear: Secondary | ICD-10-CM

## 2020-08-07 DIAGNOSIS — H1033 Unspecified acute conjunctivitis, bilateral: Secondary | ICD-10-CM

## 2020-08-07 MED ORDER — TOBRAMYCIN-DEXAMETHASONE 0.3-0.05 % OP SUSP
3.0000 [drp] | Freq: Two times a day (BID) | OPHTHALMIC | 0 refills | Status: DC
Start: 2020-08-07 — End: 2020-12-19

## 2020-08-07 MED ORDER — OFLOXACIN 0.3 % OT SOLN: 5.0000 [drp] | Freq: Every day | OTIC | 0 refills | Status: DC

## 2020-08-07 NOTE — Patient Instructions (Signed)
Allergic Conjunctivitis, Adult  Allergic conjunctivitis is inflammation of the clear membrane (conjunctiva) that covers the white part of your eye and the inner surface of your eyelid. This condition can make your eye red or pink. It can also make your eye feel itchy. This condition cannot be spread from one person to another person (is not contagious). What are the causes? This condition is caused by allergens. These are things that can cause an allergic reaction in some people but not in others. Common allergens include:  Outdoor allergens, such as: ? Pollen, including pollen from grass and weeds. ? Mold. ? Car fumes.  Indoor allergens, such as: ? Dust. ? Smoke. ? Mold. ? Proteins in a pet's pee (urine), saliva, or dander. What increases the risk? You are more likely to develop this condition if you have a family history of these things:  Allergies.  Conditions that you get because of allergens, such as asthma or inflammation of the skin (eczema). What are the signs or symptoms? Symptoms of this condition include eyes that are:  Itchy.  Red.  Watery.  Puffy. Your eyes may also:  Sting or burn.  Have clear fluid draining from them.  Have thick mucus coming from them. How is this treated? This condition may be treated with:  Cold, wet cloths (cold compresses) to soothe itching and swelling.  Washing the face to remove allergens.  Eye drops. These may include: ? Eye drops that block allergies. ? Eye drops that reduce swelling and irritation. ? Steroid eye drops if other treatments have not worked.  Oral antihistamine medicines. These medicines lessen your allergies. You may need these if eye drops do not help or are difficult to use.   Follow these instructions at home: Eye care  Place a cool, clean washcloth on your eye for 10-20 minutes. Do this 3-4 times a day.  Do not touch or rub your eyes.  Do not wear contact lenses until the inflammation is gone.  Wear glasses instead.  Do not wear eye makeup until the inflammation is gone. General instructions  Try not to be around things that you are allergic to.  Take or apply over-the-counter and prescription medicines only as told by your doctor. These include any eye drops.  Drink enough fluid to keep your pee pale yellow.  Keep all follow-up visits as told by your doctor. This is important. Contact a doctor if:  Your symptoms get worse.  Your symptoms do not get better with treatment.  You have mild eye pain.  You are sensitive to light.  You have spots or blisters on your eyes.  You have pus coming from your eye.  You have a fever. Get help right away if:  You have redness, swelling, or other symptoms in only one eye.  You cannot see well.  You have other vision changes.  You have very bad eye pain. Summary  Allergic conjunctivitis is caused by allergens. It can make your eye red or pink, and it can make your eye feel itchy.  This condition cannot be spread from one person to another person (is not contagious).  Try not to be around things that you are allergic to.  Take or apply over-the-counter and prescription medicines only as told by your doctor. These include any eye drops.  Contact your doctor if your symptoms get worse or they do not get better with treatment. This information is not intended to replace advice given to you by your health care provider.   Make sure you discuss any questions you have with your health care provider. Document Revised: 04/02/2019 Document Reviewed: 04/02/2019 Elsevier Patient Education  2021 Elsevier Inc.  

## 2020-08-07 NOTE — Telephone Encounter (Signed)
Pt had recent TELE visit with Je about her eyes. Ointment was given.  This helped tremendously while using it, but she is out of the vial.  Once finishing it, they eye irritation is right back to how it was.  What should she do?  Has one more vial at home ( that was filled for her brother)   Should she use that - or something different??   JE IS OFF - sending to [facility]

## 2020-08-07 NOTE — Telephone Encounter (Signed)
Are they watery and itchY? Matted together in mornings? What color is drainage?

## 2020-08-07 NOTE — Telephone Encounter (Signed)
Pt is now coming in to be seen

## 2020-08-07 NOTE — Progress Notes (Signed)
Subjective:    Patient ID: Wendy Arnold, female    DOB: 1950-06-23, 70 y.o.   MRN: [id number]   Chief Complaint: Both eyes red   HPI Pt presents today with c/o headaches, light sensitivity, bilateral eye pruritus, redness, and pain. This began on Sunday, prior she had similar symptoms but not as severe. Today she states she cannot keep her eyes open too long. She was prescribed polysporin on March 2 which helped relieve some of her symptoms, but they never fully recovered. She believes her symptoms have returned and worsened over the last few days after her she has used all of her ointment. Her pain today is a 7-8/10. She has been taking ibuprofen, warm or cold compress. She uses a clean cloth every time and has changed her sheets. She denies any sinus drainage, acute viral or bacterial illness to her knowledge. Denies sore throat.     Review of Systems  Constitutional: Positive for chills. Negative for fever.  HENT: Positive for ear pain and rhinorrhea. Negative for sore throat.   Eyes: Positive for photophobia, pain, discharge, redness and itching. Negative for visual disturbance.  Respiratory: Negative for cough, shortness of breath and wheezing.   Cardiovascular: Negative for chest pain and palpitations.  Musculoskeletal: Positive for neck pain and neck stiffness.  Neurological: Positive for light-headedness and headaches. Negative for dizziness.  Psychiatric/Behavioral: Negative for confusion. The patient is not nervous/anxious.    Vitals:   08/07/20 1447  BP: 137/86  Pulse: 67  Resp: 20  Temp: (!) 97.2 F (36.2 C)        Objective:   Physical Exam HENT:     Right Ear: Tenderness present.     Ears:     Comments: + tragus tenderness; erythema middle ear canal.  Eyes:     General:        Right eye: Discharge present.        Left eye: Discharge present.    Conjunctiva/sclera:     Right eye: Right conjunctiva is injected. Exudate present.     Left eye: Left  conjunctiva is injected. Exudate present.     Comments: Clear exudate bilateral conjunctiva.  Cardiovascular:     Rate and Rhythm: Normal rate and regular rhythm.     Pulses: Normal pulses.  Pulmonary:     Effort: Pulmonary effort is normal.     Breath sounds: Normal breath sounds.  Musculoskeletal:        General: Normal range of motion.     Cervical back: Normal range of motion. Tenderness present.  Skin:    General: Skin is warm and dry.     Capillary Refill: Capillary refill takes less than 2 seconds.  Neurological:     Mental Status: She is alert and oriented to person, place, and time.  Psychiatric:        Behavior: Behavior normal.       Assessment & Plan:    Wendy Arnold comes in today with chief complaint of Both eyes red   Diagnosis and orders addressed:  1. Acute bacterial conjunctivitis of both eyes Use antibiotic drops as prescribed. Contact the office if you do not see an improvement in your symptoms by Monday. Use clean cloths, clean pillow cases, wash hands regularly, do not share washcloths with others. Throw eye make up away.   - Tobramycin-Dexamethasone 0.3-0.05 % SUSP; Apply 3 drops to eye in the morning and at bedtime.  Dispense: 5 mL; Refill: 0  2. Acute  diffuse otitis externa of right ear Use drops as prescribed.  - ofloxacin (FLOXIN OTIC) 0.3 % OTIC solution; Place 5 drops into both ears daily.  Dispense: 10 mL; Refill: 0   Labs pending Health Maintenance reviewed Diet and exercise encouraged  Follow up plan: PRN  Oretha Milch, RN, BSN, FNP-Student  Mary-Margaret Daphine Deutscher, FNP

## 2020-08-26 ENCOUNTER — Ambulatory Visit (INDEPENDENT_AMBULATORY_CARE_PROVIDER_SITE_OTHER): Payer: Medicare HMO

## 2020-08-26 VITALS — BP 146/79 | HR 72 | Ht 66.5 in | Wt 160.0 lb

## 2020-08-26 DIAGNOSIS — Z1231 Encounter for screening mammogram for malignant neoplasm of breast: Secondary | ICD-10-CM | POA: Diagnosis not present

## 2020-08-26 DIAGNOSIS — Z780 Asymptomatic menopausal state: Secondary | ICD-10-CM

## 2020-08-26 DIAGNOSIS — Z1211 Encounter for screening for malignant neoplasm of colon: Secondary | ICD-10-CM | POA: Diagnosis not present

## 2020-08-26 DIAGNOSIS — Z0000 Encounter for general adult medical examination without abnormal findings: Secondary | ICD-10-CM

## 2020-08-26 NOTE — Progress Notes (Addendum)
Subjective:   Wendy Arnold is a 70 y.o. female who presents for Medicare Annual (Subsequent) preventive examination.  Virtual Visit via Telephone Note  I connected with  Wendy Arnold on 08/26/20 at  2:00 PM EDT by telephone and verified that I am speaking with the correct person using two identifiers.  Location: Patient: Home Provider: [facility] Persons participating in the virtual visit: patient/Nurse Health Advisor   I discussed the limitations, risks, security and privacy concerns of performing an evaluation and management service by telephone and the availability of in person appointments. The patient expressed understanding and agreed to proceed.  Interactive audio and video telecommunications were attempted between this nurse and patient, however failed, due to patient having technical difficulties OR patient did not have access to video capability.  We continued and completed visit with audio only.  Some vital signs may be absent or patient reported.   Chetan Mehring E Loretha Ure, LPN   Review of Systems     Cardiac Risk Factors include: advanced age (>11men, >30 women);dyslipidemia;hypertension;sedentary lifestyle     Objective:    Today's Vitals   08/26/20 1420 08/26/20 1422  BP: (!) 146/79   Pulse: 72   SpO2: 92%   Weight: 160 lb (72.6 kg)   Height: 5' 6.5" (1.689 m)   PainSc:  8    Body mass index is 25.44 kg/m.  Advanced Directives 08/26/2020 02/26/2014 07/24/2011  Does Patient Have a Medical Advance Directive? No No Patient does not have advance directive  Would patient like information on creating a medical advance directive? No - Patient declined No - patient declined information -  Pre-existing out of facility DNR order (yellow form or pink MOST form) - - No    Current Medications (verified) Outpatient Encounter Medications as of 08/26/2020  Medication Sig  . ALPRAZolam (XANAX) 0.5 MG tablet Take 1 tablet (0.5 mg total) by mouth 2 (two) times daily as needed for  anxiety.  Marland Kitchen amLODipine-benazepril (LOTREL) 10-20 MG capsule Take 1 capsule by mouth daily.  Marland Kitchen aspirin EC 81 MG tablet Take 81 mg by mouth at bedtime.   . gabapentin (NEURONTIN) 300 MG capsule Take 1 capsule (300 mg total) by mouth 3 (three) times daily.  Marland Kitchen ofloxacin (FLOXIN OTIC) 0.3 % OTIC solution Place 5 drops into both ears daily.  . traZODone (DESYREL) 150 MG tablet Take 1 tablet (150 mg total) by mouth at bedtime. *May take 150mg  as prescribed*  . bacitracin-polymyxin b (POLYSPORIN) ophthalmic ointment Place 1 application into both eyes 2 (two) times daily. apply to eye every 12 hours while awake  . chlorhexidine (PERIDEX) 0.12 % solution SMARTSIG:0.5 Ounce(s) By Mouth Twice Daily  . HYDROcodone-acetaminophen (NORCO) 10-325 MG tablet Take 1 tablet by mouth every 6 (six) hours as needed.  ibuprofen (ADVIL) 800 MG tablet Take 1 tablet (800 mg total) by mouth every 8 (eight) hours as needed.  . Multiple Vitamins-Minerals (ALIVE WOMENS 50+ PO) Take by mouth.  . rosuvastatin (CRESTOR) 10 MG tablet Take 1 tablet (10 mg total) by mouth daily. (Patient not taking: Reported on 08/26/2020)  . Tobramycin-Dexamethasone 0.3-0.05 % SUSP Apply 3 drops to eye in the morning and at bedtime.   No facility-administered encounter medications on file as of 08/26/2020.    Allergies (verified) Amoxicillin-pot clavulanate and Flagyl [metronidazole]   History: Past Medical History:  Diagnosis Date  . Anxiety   . Back pain   . Depression   . Diverticulitis   . Insomnia   . Migraines  Past Surgical History:  Procedure Laterality Date  . ABDOMINAL HYSTERECTOMY    . BACK SURGERY    . SINUS ENDO WITH FUSION    . TOE SURGERY     Family History  Problem Relation Age of Onset  . Heart failure Mother   . Cancer Father   . Cancer Brother   . Cancer Brother   . Cancer Brother    Social History   Socioeconomic History  . Marital status: Divorced    Spouse name: Not on file  . Number of children:  Not on file  . Years of education: Not on file  . Highest education level: Not on file  Occupational History  . Occupation: retired  Tobacco Use  . Smoking status: Current Every Day Smoker    Packs/day: 0.25    Years: 40.00    Pack years: 10.00    Types: Cigarettes  . Smokeless tobacco: Never Used  Substance and Sexual Activity  . Alcohol use: Not Currently    Comment: rarely  . Drug use: No  . Sexual activity: Never    Birth control/protection: Surgical  Other Topics Concern  . Not on file  Social History Narrative   Her mentally disabled brother lives with her   Social Determinants of Health   Financial Resource Strain: Low Risk   . Difficulty of Paying Living Expenses: Not hard at all  Food Insecurity: No Food Insecurity  . Worried About Programme researcher, broadcasting/film/video in the Last Year: Never true  . Ran Out of Food in the Last Year: Never true  Transportation Needs: No Transportation Needs  . Lack of Transportation (Medical): No  . Lack of Transportation (Non-Medical): No  Physical Activity: Inactive  . Days of Exercise per Week: 0 days  . Minutes of Exercise per Session: 0 min  Stress: No Stress Concern Present  . Feeling of Stress : Only a little  Social Connections: Socially Isolated  . Frequency of Communication with Friends and Family: More than three times a week  . Frequency of Social Gatherings with Friends and Family: More than three times a week  . Attends Religious Services: Never  . Active Member of Clubs or Organizations: No  . Attends Banker Meetings: Never  . Marital Status: Divorced    Tobacco Counseling Ready to quit: Not Answered Counseling given: Not Answered   Clinical Intake:  Pre-visit preparation completed: Yes  Pain : 0-10 Pain Score: 8  Pain Type: Chronic pain Pain Location: Back Pain Onset: In the past 7 days Pain Frequency: Intermittent     BMI - recorded: 25.44 Nutritional Risks: None  How often do you need to have  someone help you when you read instructions, pamphlets, or other written materials from your doctor or pharmacy?: 1 - Never  Diabetic? No  Interpreter Needed?: No  Information entered by :: Kelia Gibbon, LPN   Activities of Daily Living In your present state of health, do you have any difficulty performing the following activities: 08/26/2020 06/26/2020  Hearing? N N  Vision? N Y  Difficulty concentrating or making decisions? N Y  Comment - remembering not much  Walking or climbing stairs? N Y  Comment - gait not real good  Dressing or bathing? N N  Comment - little buttons  Doing errands, shopping? N Y  Comment - little bit, maybe just not wanting to go  Preparing Food and eating ? N -  Using the Toilet? N -  In the past  six months, have you accidently leaked urine? N -  Do you have problems with loss of bowel control? N -  Managing your Medications? N -  Managing your Finances? N -  Housekeeping or managing your Housekeeping? N -  Some recent data might be hidden    Patient Care Team: Bennie Pierini, FNP as PCP - General (Family Medicine)  Indicate any recent Medical Services you may have received from other than [facility] providers in the past year (date may be approximate).     Assessment:   This is a routine wellness examination for [place].  Hearing/Vision screen  Hearing Screening   125Hz  250Hz  500Hz  1000Hz  2000Hz  3000Hz  4000Hz  6000Hz  8000Hz   Right ear:           Left ear:           Comments: Denies hearing loss  Vision Screening Comments: Wears glasses - Annual visits with [facility] in [place]  Dietary issues and exercise activities discussed: Current Exercise Habits: The patient does not participate in regular exercise at present, Exercise limited by: orthopedic condition(s)  Goals    . Quit Smoking     She is down to 5-6 per day - hopes to quit by the next year      Depression Screen PHQ 2/9 Scores 08/26/2020 08/07/2020 06/26/2020 12/25/2019 09/24/2019 06/25/2019   PHQ - 2 Score 0 0 0 0 0 0    Fall Risk Fall Risk  08/26/2020 08/07/2020 06/26/2020 12/25/2019 09/24/2019  Falls in the past year? 1 0 1 0 1  Comment says she is clumsy - has fallen 3 times - no injury - - - -  Number falls in past yr: 1 - 0 - 1  Injury with Fall? 0 - 0 - 0  Risk for fall due to : Orthopedic patient - Impaired balance/gait - -  Follow up Falls prevention discussed - Falls evaluation completed - -    FALL RISK PREVENTION PERTAINING TO THE HOME:  Any stairs in or around the home? Yes  If so, are there any without handrails? No  Home free of loose throw rugs in walkways, pet beds, electrical cords, etc? Yes  Adequate lighting in your home to reduce risk of falls? Yes   ASSISTIVE DEVICES UTILIZED TO PREVENT FALLS:  Life alert? No  Use of a cane, walker or w/c? No  Grab bars in the bathroom? Yes  Shower chair or bench in shower? No  Elevated toilet seat or a handicapped toilet? Yes   TIMED UP AND GO:  Was the test performed? No . Telephonic visit  Cognitive Function: Normal cognitive status assessed by direct observation by this Nurse Health Advisor. No abnormalities found.         Immunizations Immunization History  Administered Date(s) Administered  . Fluad Quad(high Dose 65+) 03/31/2020  . Influenza,inj,Quad PF,6+ Mos 04/09/2014, 02/23/2016, 03/24/2017, 04/08/2019  . Janssen (J&J) SARS-COV-2 Vaccination 09/24/2019  . Moderna Sars-Covid-2 Vaccination 06/24/2020  . PPD Test 07/24/2011, 04/09/2014  . Pneumococcal Conjugate-13 04/09/2014  . Pneumococcal Polysaccharide-23 08/25/2015  . Tdap 02/12/2014    TDAP status: Up to date  Flu Vaccine status: Up to date  Pneumococcal vaccine status: Up to date  Covid-19 vaccine status: Completed vaccines  Qualifies for Shingles Vaccine? Yes   Zostavax completed No   Shingrix Completed?: No.    Education has been provided regarding the importance of this vaccine. Patient has been advised to call insurance company to  determine out of pocket expense if they  have not yet received this vaccine. Advised may also receive vaccine at local pharmacy or Health Dept. Verbalized acceptance and understanding.  Screening Tests Health Maintenance  Topic Date Due  . COLONOSCOPY (Pts 45-62yrs Insurance coverage will need to be confirmed)  06/26/2021 (Originally [birth year]02/1997)  . Hepatitis C Screening  06/26/2021 (Originally May 24, 1951)  . INFLUENZA VACCINE  12/22/2020  . MAMMOGRAM  01/31/2021  . TETANUS/TDAP  02/13/2024  . DEXA SCAN  Completed  . COVID-19 Vaccine  Completed  . HPV VACCINES  Aged Out  . PNA vac Low Risk Adult  Discontinued    Health Maintenance  There are no preventive care reminders to display for this patient.  Colorectal screening: declined; she says she can't get cleaned out - She has been discussing with [employer]  Mammogram status: Ordered 08/26/20. Pt provided with contact info and advised to call to schedule appt.   Bone Density status: Completed 07/08/2011. Results reflect: Bone density results: NORMAL. Repeat every 2 years. Ordered repeat today.  Lung Cancer Screening: (Low Dose CT Chest recommended if Age 55-80 years, 30 pack-year currently smoking OR have quit w/in 15years.) Does qualify. She will discuss with PCP.  Additional Screening:  Hepatitis C Screening: does qualify; Due at next OV  Vision Screening: Recommended annual ophthalmology exams for early detection of glaucoma and other disorders of the eye. Is the patient up to date with their annual eye exam?  Yes  Who is the provider or what is the name of the office in which the patient attends annual eye exams? Daphine Deutscher- [facility] If pt is not established with a provider, would they like to be referred to a provider to establish care? No .   Dental Screening: Recommended annual dental exams for proper oral hygiene  Community Resource Referral / Chronic Care Management: CRR required this visit?  No   CCM required this visit?  No       Plan:     I have personally reviewed and noted the following in the patient's chart:   . Medical and social history . Use of alcohol, tobacco or illicit drugs  . Current medications and supplements . Functional ability and status . Nutritional status . Physical activity . Advanced directives . List of other physicians . Hospitalizations, surgeries, and ER visits in previous 12 months . Vitals . Screenings to include cognitive, depression, and falls . Referrals and appointments  In addition, I have reviewed and discussed with patient certain preventive protocols, quality metrics, and best practice recommendations. A written personalized care plan for preventive services as well as general preventive health recommendations were provided to patient.     [place] Constable, LPN   [id number]/2774   Nurse Notes: Bone density and Mammogram ordered today; She will see if she can reschedule colonoscopy (new order sent just in case). Needs Hep C screening with next labs. She is trying to quit smoking; will think about whether or not she wants to do Low dose chest CT for lung cancer screening.

## 2020-08-26 NOTE — Patient Instructions (Addendum)
Wendy Arnold , Thank you for taking time to come for your Medicare Wellness Visit. I appreciate your ongoing commitment to your health goals. Please review the following plan we discussed and let me know if I can assist you in the future.   Screening recommendations/referrals: Colonoscopy: Referral order sent to Dr Karilyn Cota Mammogram: Referral order sent today - Repeat annually Bone Density: Referral order sent today - Repeat every 2 years Recommended yearly ophthalmology/optometry visit for glaucoma screening and checkup Recommended yearly dental visit for hygiene and checkup  Vaccinations: Influenza vaccine: Done 03/31/2020 - Repeat annually Pneumococcal vaccine: Done 04/09/2014 & 08/25/2015 - No repeat required Tdap vaccine: Done 02/12/2014 - Repeat every 10 years Shingles vaccine: Shingrix discussed. Please contact your pharmacy for coverage information.    Covid-19: Complete: 09/24/2019 & 06/24/2020  Advanced directives: Advance directive discussed with you today. Even though you declined this today, please call our office should you change your mind, and we can give you the proper paperwork for you to fill out.  Conditions/risks identified: Continue to work on diet and exercise. Walking 30 minutes each day can help you feel better physically and mentally! If you wish to quit smoking, help is available. For free tobacco cessation program offerings call the [facility] at 303-499-0119 or Live Well Line at [phone]. You may also visit [email or web address] or email [email or web address] for more information on other programs.   You may also call 1-800-QUIT-NOW (605-784-2816) or visit www.[place].ch or www.BecomeAnEx.org for additional resources on smoking cessation.    Next appointment: Follow up in one year for your annual wellness visit    Preventive Care 65 Years and Older, Female Preventive care refers to lifestyle choices and visits with your health care provider  that can promote health and wellness. What does preventive care include?  A yearly physical exam. This is also called an annual well check.  Dental exams once or twice a year.  Routine eye exams. Ask your health care provider how often you should have your eyes checked.  Personal lifestyle choices, including:  Daily care of your teeth and gums.  Regular physical activity.  Eating a healthy diet.  Avoiding tobacco and drug use.  Limiting alcohol use.  Practicing safe sex.  Taking low-dose aspirin every day.  Taking vitamin and mineral supplements as recommended by your health care provider. What happens during an annual well check? The services and screenings done by your health care provider during your annual well check will depend on your age, overall health, lifestyle risk factors, and family history of disease. Counseling  Your health care provider may ask you questions about your:  Alcohol use.  Tobacco use.  Drug use.  Emotional well-being.  Home and relationship well-being.  Sexual activity.  Eating habits.  History of falls.  Memory and ability to understand (cognition).  Work and work Astronomer.  Reproductive health. Screening  You may have the following tests or measurements:  Height, weight, and BMI.  Blood pressure.  Lipid and cholesterol levels. These may be checked every 5 years, or more frequently if you are over 53 years old.  Skin check.  Lung cancer screening. You may have this screening every year starting at age 81 if you have a 30-pack-year history of smoking and currently smoke or have quit within the past 15 years.  Fecal occult blood test (FOBT) of the stool. You may have this test every year starting at age 26.  Flexible sigmoidoscopy or colonoscopy. You may have  a sigmoidoscopy every 5 years or a colonoscopy every 10 years starting at age 46.  Hepatitis C blood test.  Hepatitis B blood test.  Sexually transmitted  disease (STD) testing.  Diabetes screening. This is done by checking your blood sugar (glucose) after you have not eaten for a while (fasting). You may have this done every 1-3 years.  Bone density scan. This is done to screen for osteoporosis. You may have this done starting at age 68.  Mammogram. This may be done every 1-2 years. Talk to your health care provider about how often you should have regular mammograms. Talk with your health care provider about your test results, treatment options, and if necessary, the need for more tests. Vaccines  Your health care provider may recommend certain vaccines, such as:  Influenza vaccine. This is recommended every year.  Tetanus, diphtheria, and acellular pertussis (Tdap, Td) vaccine. You may need a Td booster every 10 years.  Zoster vaccine. You may need this after age 70.  Pneumococcal 13-valent conjugate (PCV13) vaccine. One dose is recommended after age 44.  Pneumococcal polysaccharide (PPSV23) vaccine. One dose is recommended after age 44. Talk to your health care provider about which screenings and vaccines you need and how often you need them. This information is not intended to replace advice given to you by your health care provider. Make sure you discuss any questions you have with your health care provider. Document Released: 06/06/2015 Document Revised: 01/28/2016 Document Reviewed: 03/11/2015 Elsevier Interactive Patient Education  2017 [employer].  Fall Prevention in the Home Falls can cause injuries. They can happen to people of all ages. There are many things you can do to make your home safe and to help prevent falls. What can I do on the outside of my home?  Regularly fix the edges of walkways and driveways and fix any cracks.  Remove anything that might make you trip as you walk through a door, such as a raised step or threshold.  Trim any bushes or trees on the path to your home.  Use bright outdoor  lighting.  Clear any walking paths of anything that might make someone trip, such as rocks or tools.  Regularly check to see if handrails are loose or broken. Make sure that both sides of any steps have handrails.  Any raised decks and porches should have guardrails on the edges.  Have any leaves, snow, or ice cleared regularly.  Use sand or salt on walking paths during winter.  Clean up any spills in your garage right away. This includes oil or grease spills. What can I do in the bathroom?  Use night lights.  Install grab bars by the toilet and in the tub and shower. Do not use towel bars as grab bars.  Use non-skid mats or decals in the tub or shower.  If you need to sit down in the shower, use a plastic, non-slip stool.  Keep the floor dry. Clean up any water that spills on the floor as soon as it happens.  Remove soap buildup in the tub or shower regularly.  Attach bath mats securely with double-sided non-slip rug tape.  Do not have throw rugs and other things on the floor that can make you trip. What can I do in the bedroom?  Use night lights.  Make sure that you have a light by your bed that is easy to reach.  Do not use any sheets or blankets that are too big for your bed.  They should not hang down onto the floor.  Have a firm chair that has side arms. You can use this for support while you get dressed.  Do not have throw rugs and other things on the floor that can make you trip. What can I do in the kitchen?  Clean up any spills right away.  Avoid walking on wet floors.  Keep items that you use a lot in easy-to-reach places.  If you need to reach something above you, use a strong step stool that has a grab bar.  Keep electrical cords out of the way.  Do not use floor polish or wax that makes floors slippery. If you must use wax, use non-skid floor wax.  Do not have throw rugs and other things on the floor that can make you trip. What can I do with my  stairs?  Do not leave any items on the stairs.  Make sure that there are handrails on both sides of the stairs and use them. Fix handrails that are broken or loose. Make sure that handrails are as long as the stairways.  Check any carpeting to make sure that it is firmly attached to the stairs. Fix any carpet that is loose or worn.  Avoid having throw rugs at the top or bottom of the stairs. If you do have throw rugs, attach them to the floor with carpet tape.  Make sure that you have a light switch at the top of the stairs and the bottom of the stairs. If you do not have them, ask someone to add them for you. What else can I do to help prevent falls?  Wear shoes that:  Do not have high heels.  Have rubber bottoms.  Are comfortable and fit you well.  Are closed at the toe. Do not wear sandals.  If you use a stepladder:  Make sure that it is fully opened. Do not climb a closed stepladder.  Make sure that both sides of the stepladder are locked into place.  Ask someone to hold it for you, if possible.  Clearly mark and make sure that you can see:  Any grab bars or handrails.  First and last steps.  Where the edge of each step is.  Use tools that help you move around (mobility aids) if they are needed. These include:  Canes.  Walkers.  Scooters.  Crutches.  Turn on the lights when you go into a dark area. Replace any light bulbs as soon as they burn out.  Set up your furniture so you have a clear path. Avoid moving your furniture around.  If any of your floors are uneven, fix them.  If there are any pets around you, be aware of where they are.  Review your medicines with your doctor. Some medicines can make you feel dizzy. This can increase your chance of falling. Ask your doctor what other things that you can do to help prevent falls. This information is not intended to replace advice given to you by your health care provider. Make sure you discuss any  questions you have with your health care provider. Document Released: 03/06/2009 Document Revised: 10/16/2015 Document Reviewed: 06/14/2014 Elsevier Interactive Patient Education  2017 [employer].    Managing the Challenge of Quitting Smoking Quitting smoking is a physical and mental challenge. You will face cravings, withdrawal symptoms, and temptation. Before quitting, work with your health care provider to make a plan that can help you manage quitting. Preparation can help  you quit and keep you from giving in. How to manage lifestyle changes Managing stress Stress can make you want to smoke, and wanting to smoke may cause stress. It is important to find ways to manage your stress. You might try some of the following:  Practice relaxation techniques. ? Breathe slowly and deeply, in through your nose and out through your mouth. ? Listen to music. ? Soak in a bath or take a shower. ? Imagine a peaceful place or vacation.  Get some support. ? Talk with family or friends about your stress. ? Join a support group. ? Talk with a counselor or therapist.  Get some physical activity. ? Go for a walk, run, or bike ride. ? Play a favorite sport. ? Practice yoga.   Medicines Talk with your health care provider about medicines that might help you deal with cravings and make quitting easier for you. Relationships Social situations can be difficult when you are quitting smoking. To manage this, you can:  Avoid parties and other social situations where people might be smoking.  Avoid alcohol.  Leave right away if you have the urge to smoke.  Explain to your family and friends that you are quitting smoking. Ask for support and let them know you might be a bit grumpy.  Plan activities where smoking is not an option. General instructions Be aware that many people gain weight after they quit smoking. However, not everyone does. To keep from gaining weight, have a plan in place before you  quit and stick to the plan after you quit. Your plan should include:  Having healthy snacks. When you have a craving, it may help to: ? Eat popcorn, carrots, celery, or other cut vegetables. ? Chew sugar-free gum.  Changing how you eat. ? Eat small portion sizes at meals. ? Eat 4-6 small meals throughout the day instead of 1-2 large meals a day. ? Be mindful when you eat. Do not watch television or do other things that might distract you as you eat.  Exercising regularly. ? Make time to exercise each day. If you do not have time for a long workout, do short bouts of exercise for 5-10 minutes several times a day. ? Do some form of strengthening exercise, such as weight lifting. ? Do some exercise that gets your heart beating and causes you to breathe deeply, such as walking fast, running, swimming, or biking. This is very important.  Drinking plenty of water or other low-calorie or no-calorie drinks. Drink 6-8 glasses of water daily.   How to recognize withdrawal symptoms Your body and mind may experience discomfort as you try to get used to not having nicotine in your system. These effects are called withdrawal symptoms. They may include:  Feeling hungrier than normal.  Having trouble concentrating.  Feeling irritable or restless.  Having trouble sleeping.  Feeling depressed.  Craving a cigarette. To manage withdrawal symptoms:  Avoid places, people, and activities that trigger your cravings.  Remember why you want to quit.  Get plenty of sleep.  Avoid coffee and other caffeinated drinks. These may worsen some of your symptoms. These symptoms may surprise you. But be assured that they are normal to have when quitting smoking. How to manage cravings Come up with a plan for how to deal with your cravings. The plan should include the following:  A definition of the specific situation you want to deal with.  An alternative action you will take.  A clear idea for how  this  action will help.  The name of someone who might help you with this. Cravings usually last for 5-10 minutes. Consider taking the following actions to help you with your plan to deal with cravings:  Keep your mouth busy. ? Chew sugar-free gum. ? Suck on hard candies or a straw. ? Brush your teeth.  Keep your hands and body busy. ? Change to a different activity right away. ? Squeeze or play with a ball. ? Do an activity or a hobby, such as making bead jewelry, practicing needlepoint, or working with wood. ? Mix up your normal routine. ? Take a short exercise break. Go for a quick walk or run up and down stairs.  Focus on doing something kind or helpful for someone else.  Call a friend or family member to talk during a craving.  Join a support group.  Contact a quitline. Where to find support To get help or find a support group:  Call the National Cancer Institute's Smoking Quitline: 1-800-QUIT NOW 272-428-9304(724-040-2386)  Visit the website of the Substance Abuse and Mental Health Services Administration: SkateOasis.com.ptwww.samhsa.gov  Text QUIT to SmokefreeTXT: 191478: 478848 Where to find more information Visit these websites to find more information on quitting smoking:  National Cancer Institute: www.smokefree.gov  American Lung Association: www.lung.org  American Cancer Society: www.cancer.org  Centers for Disease Control and Prevention: FootballExhibition.com.brwww.cdc.gov  American Heart Association: www.heart.org Contact a health care provider if:  You want to change your plan for quitting.  The medicines you are taking are not helping.  Your eating feels out of control or you cannot sleep. Get help right away if:  You feel depressed or become very anxious. Summary  Quitting smoking is a physical and mental challenge. You will face cravings, withdrawal symptoms, and temptation to smoke again. Preparation can help you as you go through these challenges.  Try different techniques to manage stress, handle social  situations, and prevent weight gain.  You can deal with cravings by keeping your mouth busy (such as by chewing gum), keeping your hands and body busy, calling family or friends, or contacting a quitline for people who want to quit smoking.  You can deal with withdrawal symptoms by avoiding places where people smoke, getting plenty of rest, and avoiding drinks with caffeine. This information is not intended to replace advice given to you by your health care provider. Make sure you discuss any questions you have with your health care provider. Document Revised: 02/27/2019 Document Reviewed: 02/27/2019 Elsevier Patient Education  2021 [employer]

## 2020-08-27 ENCOUNTER — Encounter (INDEPENDENT_AMBULATORY_CARE_PROVIDER_SITE_OTHER): Payer: Self-pay | Admitting: *Deleted

## 2020-10-10 ENCOUNTER — Other Ambulatory Visit: Payer: Self-pay | Admitting: Nurse Practitioner

## 2020-10-10 DIAGNOSIS — F5101 Primary insomnia: Secondary | ICD-10-CM

## 2020-12-17 ENCOUNTER — Other Ambulatory Visit: Payer: Self-pay | Admitting: Nurse Practitioner

## 2020-12-17 ENCOUNTER — Other Ambulatory Visit: Payer: Self-pay

## 2020-12-17 ENCOUNTER — Ambulatory Visit
Admission: RE | Admit: 2020-12-17 | Discharge: 2020-12-17 | Disposition: A | Discharge status: Home / Self Care | Payer: Medicare HMO | Source: Ambulatory Visit | Attending: Nurse Practitioner | Admitting: Nurse Practitioner

## 2020-12-17 DIAGNOSIS — I10 Essential (primary) hypertension: Secondary | ICD-10-CM

## 2020-12-17 DIAGNOSIS — Z1231 Encounter for screening mammogram for malignant neoplasm of breast: Secondary | ICD-10-CM

## 2020-12-19 ENCOUNTER — Encounter: Payer: Self-pay | Admitting: Nurse Practitioner

## 2020-12-19 ENCOUNTER — Ambulatory Visit (INDEPENDENT_AMBULATORY_CARE_PROVIDER_SITE_OTHER): Payer: Medicare HMO | Admitting: Nurse Practitioner

## 2020-12-19 ENCOUNTER — Ambulatory Visit: Payer: Medicare HMO

## 2020-12-19 DIAGNOSIS — I2510 Atherosclerotic heart disease of native coronary artery without angina pectoris: Secondary | ICD-10-CM

## 2020-12-19 DIAGNOSIS — E782 Mixed hyperlipidemia: Secondary | ICD-10-CM

## 2020-12-19 DIAGNOSIS — F5101 Primary insomnia: Secondary | ICD-10-CM

## 2020-12-19 DIAGNOSIS — I10 Essential (primary) hypertension: Secondary | ICD-10-CM | POA: Diagnosis not present

## 2020-12-19 DIAGNOSIS — J410 Simple chronic bronchitis: Secondary | ICD-10-CM | POA: Diagnosis not present

## 2020-12-19 DIAGNOSIS — M545 Low back pain, unspecified: Secondary | ICD-10-CM

## 2020-12-19 DIAGNOSIS — F411 Generalized anxiety disorder: Secondary | ICD-10-CM | POA: Diagnosis not present

## 2020-12-19 DIAGNOSIS — M5442 Lumbago with sciatica, left side: Secondary | ICD-10-CM

## 2020-12-19 DIAGNOSIS — G8929 Other chronic pain: Secondary | ICD-10-CM

## 2020-12-19 DIAGNOSIS — F17210 Nicotine dependence, cigarettes, uncomplicated: Secondary | ICD-10-CM | POA: Diagnosis not present

## 2020-12-19 DIAGNOSIS — I2583 Coronary atherosclerosis due to lipid rich plaque: Secondary | ICD-10-CM

## 2020-12-19 MED ORDER — GABAPENTIN 300 MG PO CAPS: 300.0000 mg | ORAL_CAPSULE | Freq: Three times a day (TID) | ORAL | 1 refills | Status: DC

## 2020-12-19 MED ORDER — TRAZODONE HCL 150 MG PO TABS
150.0000 mg | ORAL_TABLET | Freq: Every day | ORAL | 1 refills | Status: DC
Start: 2020-12-19 — End: 2021-03-02

## 2020-12-19 MED ORDER — ALPRAZOLAM 0.5 MG PO TABS: 0.5000 mg | ORAL_TABLET | Freq: Two times a day (BID) | ORAL | 5 refills | Status: DC | PRN

## 2020-12-19 MED ORDER — ROSUVASTATIN CALCIUM 10 MG PO TABS
10.0000 mg | ORAL_TABLET | Freq: Every day | ORAL | 1 refills | Status: DC
Start: 2020-12-19 — End: 2021-07-27

## 2020-12-19 MED ORDER — AMLODIPINE BESY-BENAZEPRIL HCL 10-20 MG PO CAPS: 1.0000 | ORAL_CAPSULE | Freq: Every day | ORAL | 1 refills | Status: DC

## 2020-12-19 NOTE — Progress Notes (Signed)
Virtual Visit  Note Due to COVID-19 pandemic this visit was conducted virtually. This visit type was conducted due to national recommendations for restrictions regarding the COVID-19 Pandemic (e.g. social distancing, sheltering in place) in an effort to limit this patient's exposure and mitigate transmission in our community. All issues noted in this document were discussed and addressed.  A physical exam was not performed with this format.  I connected with Wendy Arnold on 12/19/20 at 12:10 by telephone and verified that I am speaking with the correct person using two identifiers. Wendy Arnold is currently located at home and no one is currently with her during visit. The provider, Mary-Margaret Daphine Deutscher, FNP is located in their office at time of visit.  I discussed the limitations, risks, security and privacy concerns of performing an evaluation and management service by telephone and the availability of in person appointments. I also discussed with the patient that there may be a patient responsible charge related to this service. The patient expressed understanding and agreed to proceed.   History and Present Illness:   Chief Complaint: medical management of chronic issues  Has runny nose and not able to come in office for her routine follow up   HPI:  1. Essential hypertension No c/o chest pain, sob or headache. Does not check blood pressure at home. BP Readings from Last 3 Encounters:  08/26/20 (!) 146/79  08/07/20 137/86  06/26/20 113/78     2. Mixed hyperlipidemia Does not watch diet and does no dedicated exercise. Lab Results  Component Value Date   CHOL 173 06/26/2020   HDL 46 06/26/2020   LDLCALC 107 (H) 06/26/2020   TRIG 109 06/26/2020   CHOLHDL 3.8 06/26/2020     3. Simple chronic bronchitis (HCC) Is on no inhalers and is doing ok  4. Coronary artery disease due to lipid rich plaque Has not seen cardiology. Says she does not feel like she needs to go at  this time.  5. Nicotine dependence, cigarettes, uncomplicated Still smoking. Says it helps calm her down  6. GAD (generalized anxiety disorder) Is on xanax BID. Helps calm her down. GAD 7 : Generalized Anxiety Score 12/19/2020 06/26/2020 09/24/2019 06/25/2019  Nervous, Anxious, on Edge 2 3 2 2   Control/stop worrying 1 2 3 3   Worry too much - different things 1 2 3 3   Trouble relaxing 0 1 1 2   Restless 0 0 0 2  Easily annoyed or irritable 1 1 1 2   Afraid - awful might happen 1 2 2  0  Total GAD 7 Score 6 11 12 14   Anxiety Difficulty Somewhat difficult Somewhat difficult Not difficult at all Somewhat difficult      7. Primary insomnia Is on trazadone nightly and is doing well. Sleep about 6-7 hours a night    Outpatient Encounter Medications as of 12/19/2020  Medication Sig   ALPRAZolam (XANAX) 0.5 MG tablet Take 1 tablet (0.5 mg total) by mouth 2 (two) times daily as needed for anxiety.   amLODipine-benazepril (LOTREL) 10-20 MG capsule TAKE 1 CAPSULE EVERY DAY   aspirin EC 81 MG tablet Take 81 mg by mouth at bedtime.    bacitracin-polymyxin b (POLYSPORIN) ophthalmic ointment Place 1 application into both eyes 2 (two) times daily. apply to eye every 12 hours while awake   chlorhexidine (PERIDEX) 0.12 % solution SMARTSIG:0.5 Ounce(s) By Mouth Twice Daily   gabapentin (NEURONTIN) 300 MG capsule Take 1 capsule (300 mg total) by mouth 3 (three) times daily.  HYDROcodone-acetaminophen (NORCO) 10-325 MG tablet Take 1 tablet by mouth every 6 (six) hours as needed.   ibuprofen (ADVIL) 800 MG tablet Take 1 tablet (800 mg total) by mouth every 8 (eight) hours as needed.   Multiple Vitamins-Minerals (ALIVE WOMENS 50+ PO) Take by mouth.   ofloxacin (FLOXIN OTIC) 0.3 % OTIC solution Place 5 drops into both ears daily.   rosuvastatin (CRESTOR) 10 MG tablet Take 1 tablet (10 mg total) by mouth daily. (Patient not taking: Reported on 08/26/2020)   Tobramycin-Dexamethasone 0.3-0.05 % SUSP Apply 3 drops to  eye in the morning and at bedtime.   traZODone (DESYREL) 150 MG tablet TAKE 1 TABLET AT BEDTIME. MAY TAKE 150MG  AS PRESCRIBED   No facility-administered encounter medications on file as of 12/19/2020.    Past Surgical History:  Procedure Laterality Date   ABDOMINAL HYSTERECTOMY     BACK SURGERY     SINUS ENDO WITH FUSION     TOE SURGERY      Family History  Problem Relation Age of Onset   Heart failure Mother    Cancer Father    Cancer Brother    Cancer Brother    Cancer Brother    Breast cancer Neg Hx     New complaints: Chronic back is worsening. She would like to go to pain management. Rates pain from 7-10/10 activity increases pain.   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 Negative for chills, diaphoresis, fever and weight loss.  Eyes:  Negative for blurred vision, double vision and pain.  Respiratory:  Negative for shortness of breath.   Cardiovascular:  Negative for chest pain, palpitations, orthopnea and leg swelling.  Gastrointestinal:  Negative for abdominal pain.  Musculoskeletal:  Positive for back pain.  Skin:  Negative for rash.  Neurological:  Negative for dizziness, sensory change, loss of consciousness, weakness and headaches.  Endo/Heme/Allergies:  Negative for polydipsia. Does not bruise/bleed easily.  Psychiatric/Behavioral:  Negative for memory loss. The patient does not have insomnia.   All other systems reviewed and are negative.   Observations/Objective: Alert and oriented- answers all questions appropriately No distress Dry cough  Assessment and Plan: Wendy Arnold comes in today with chief complaint of No chief complaint on file.   Diagnosis and orders addressed:  1. Essential hypertension Low sodium diet - amLODipine-benazepril (LOTREL) 10-20 MG capsule; Take 1 capsule by mouth daily.   Dispense: 90 capsule; Refill: 1  2. Mixed hyperlipidemia Low fat diet - rosuvastatin (CRESTOR) 10 MG tablet; Take 1 tablet (10 mg total) by mouth daily.  Dispense: 90 tablet; Refill: 1  3. Simple chronic bronchitis (HCC)  4. Coronary artery disease due to lipid rich plaque   5. Nicotine dependence, cigarettes, uncomplicated Smoking cessation encouraged as always  6. GAD (generalized anxiety disorder) Stress management reviewed - ALPRAZolam (XANAX) 0.5 MG tablet; Take 1 tablet (0.5 mg total) by mouth 2 (two) times daily as needed for anxiety.  Dispense: 60 tablet; Refill: 5  7. Primary insomnia Bedtime routine - traZODone (DESYREL) 150 MG tablet; Take 1 tablet (150 mg total) by mouth at bedtime.  Dispense: 90 tablet; Refill: 1  8. Chronic midline low back pain with left-sided sciatica Referral to painmanagemen - Ambulatory referral to Pain Clinic  9. Chronic midline low back pain without sciatica - gabapentin (NEURONTIN) 300 MG capsule; Take 1 capsule (300 mg  total) by mouth 3 (three) times daily.  Dispense: 270 capsule; Refill: 1   Labs pending Health Maintenance reviewed Diet and exercise encouraged  Follow up plan: 3 months        I discussed the assessment and treatment plan with the patient. The patient was provided an opportunity to ask questions and all were answered. The patient agreed with the plan and demonstrated an understanding of the instructions.   The patient was advised to call back or seek an in-person evaluation if the symptoms worsen or if the condition fails to improve as anticipated.  The above assessment and management plan was discussed with the patient. The patient verbalized understanding of and has agreed to the management plan. Patient is aware to call the clinic if symptoms persist or worsen. Patient is aware when to return to the clinic for a follow-up visit. Patient educated on when it is appropriate to go to the emergency department.    Time call ended:  2:27  I provided 17 minutes of  non face-to-face time during this encounter.    Mary-Margaret Daphine Deutscher, FNP

## 2020-12-30 ENCOUNTER — Encounter: Payer: Self-pay | Admitting: Physical Medicine and Rehabilitation

## 2021-01-09 ENCOUNTER — Other Ambulatory Visit: Payer: Self-pay

## 2021-01-09 ENCOUNTER — Ambulatory Visit (INDEPENDENT_AMBULATORY_CARE_PROVIDER_SITE_OTHER): Payer: Medicare HMO | Admitting: Nurse Practitioner

## 2021-01-09 VITALS — BP 136/81 | HR 84 | Temp 98.1°F | Ht 66.5 in | Wt 169.0 lb

## 2021-01-09 DIAGNOSIS — S90465A Insect bite (nonvenomous), left lesser toe(s), initial encounter: Secondary | ICD-10-CM | POA: Diagnosis not present

## 2021-01-09 DIAGNOSIS — T63301A Toxic effect of unspecified spider venom, accidental (unintentional), initial encounter: Secondary | ICD-10-CM | POA: Insufficient documentation

## 2021-01-09 MED ORDER — DOXYCYCLINE HYCLATE 100 MG PO TABS
100.0000 mg | ORAL_TABLET | Freq: Two times a day (BID) | ORAL | 0 refills | Status: DC
Start: 2021-01-09 — End: 2021-01-21

## 2021-01-09 NOTE — Progress Notes (Signed)
Acute Office Visit  Subjective:    Patient ID: Wendy Arnold, female    DOB: 07/14/1950, 70 y.o.   MRN: [id number]  Chief Complaint  Patient presents with   Insect Bite    HPI Patient is in today for insect bite on left second toe. Patient reports that her room is dark, and it may have been a spider but she is not quit sure. She reports moderate pain, tenderness, warmth and swelling. No fever, shortness of breath , nausea or vomiting associated with current symptoms. Incident happened 24-36 hours ago. Patient has since used ice, and neosporin over feet with no resolution.  Past Medical History:  Diagnosis Date   Anxiety    Back pain    Depression    Diverticulitis    Insomnia    Migraines     Past Surgical History:  Procedure Laterality Date   ABDOMINAL HYSTERECTOMY     BACK SURGERY     SINUS ENDO WITH FUSION     TOE SURGERY      Family History  Problem Relation Age of Onset   Heart failure Mother    Cancer Father    Cancer Brother    Cancer Brother    Cancer Brother    Breast cancer Neg Hx     Social History   Socioeconomic History   Marital status: Divorced    Spouse name: Not on file   Number of children: Not on file   Years of education: Not on file   Highest education level: Not on file  Occupational History   Occupation: retired  Tobacco Use   Smoking status: Every Day    Packs/day: 0.25    Years: 40.00    Pack years: 10.00    Types: Cigarettes   Smokeless tobacco: Never  Substance and Sexual Activity   Alcohol use: Not Currently    Comment: rarely   Drug use: No   Sexual activity: Never    Birth control/protection: Surgical  Other Topics Concern   Not on file  Social History Narrative   Her mentally disabled brother lives with her   Social Determinants of Health   Financial Resource Strain: Low Risk    Difficulty of Paying Living Expenses: Not hard at all  Food Insecurity: No Food Insecurity   Worried About Programme researcher, broadcasting/film/video in  the Last Year: Never true   Barista in the Last Year: Never true  Transportation Needs: No Transportation Needs   Lack of Transportation (Medical): No   Lack of Transportation (Non-Medical): No  Physical Activity: Inactive   Days of Exercise per Week: 0 days   Minutes of Exercise per Session: 0 min  Stress: No Stress Concern Present   Feeling of Stress : Only a little  Social Connections: Socially Isolated   Frequency of Communication with Friends and Family: More than three times a week   Frequency of Social Gatherings with Friends and Family: More than three times a week   Attends Religious Services: Never   Database administrator or Organizations: No   Attends Engineer, structural: Never   Marital Status: Divorced  Catering manager Violence: Not At Risk   Fear of Current or Ex-Partner: No   Emotionally Abused: No   Physically Abused: No   Sexually Abused: No    Outpatient Medications Prior to Visit  Medication Sig Dispense Refill   ALPRAZolam (XANAX) 0.5 MG tablet Take 1 tablet (0.5 mg total) by  mouth 2 (two) times daily as needed for anxiety. 60 tablet 5   amLODipine-benazepril (LOTREL) 10-20 MG capsule Take 1 capsule by mouth daily. 90 capsule 1   aspirin EC 81 MG tablet Take 81 mg by mouth at bedtime.      bacitracin-polymyxin b (POLYSPORIN) ophthalmic ointment Place 1 application into both eyes 2 (two) times daily. apply to eye every 12 hours while awake 7 g 0   chlorhexidine (PERIDEX) 0.12 % solution SMARTSIG:0.5 Ounce(s) By Mouth Twice Daily     gabapentin (NEURONTIN) 300 MG capsule Take 1 capsule (300 mg total) by mouth 3 (three) times daily. 270 capsule 1   HYDROcodone-acetaminophen (NORCO) 10-325 MG tablet Take 1 tablet by mouth every 6 (six) hours as needed.     ibuprofen (ADVIL) 800 MG tablet Take 1 tablet (800 mg total) by mouth every 8 (eight) hours as needed. 30 tablet 0   Multiple Vitamins-Minerals (ALIVE WOMENS 50+ PO) Take by mouth.      rosuvastatin (CRESTOR) 10 MG tablet Take 1 tablet (10 mg total) by mouth daily. 90 tablet 1   traZODone (DESYREL) 150 MG tablet Take 1 tablet (150 mg total) by mouth at bedtime. 90 tablet 1   No facility-administered medications prior to visit.    Allergies  Allergen Reactions   Amoxicillin-Pot Clavulanate    Flagyl [Metronidazole] Nausea And Vomiting and Other (See Comments)    Severe headaches; leg cramps.    Review of Systems  HENT: Negative.    Respiratory: Negative.    Gastrointestinal: Negative.   Skin:  Positive for color change and rash.  All other systems reviewed and are negative.     Objective:    Physical Exam Vitals and nursing note reviewed.  Constitutional:      Appearance: Normal appearance.  HENT:     Head: Normocephalic.     Nose: Nose normal.  Eyes:     Conjunctiva/sclera: Conjunctivae normal.  Cardiovascular:     Rate and Rhythm: Regular rhythm.     Heart sounds: Normal heart sounds.  Pulmonary:     Effort: Pulmonary effort is normal.     Breath sounds: Normal breath sounds.  Abdominal:     General: Bowel sounds are normal.  Skin:    General: Skin is warm.     Findings: Abscess and erythema present.       Neurological:     Mental Status: She is alert and oriented to person, place, and time.  Psychiatric:        Behavior: Behavior normal.    BP 136/81   Pulse 84   Temp 98.1 F (36.7 C) (Temporal)   Ht 5' 6.5" (1.689 m)   Wt 169 lb (76.7 kg)   BMI 26.87 kg/m  Wt Readings from Last 3 Encounters:  01/09/21 169 lb (76.7 kg)  08/26/20 160 lb (72.6 kg)  08/07/20 164 lb (74.4 kg)    Health Maintenance Due  Topic Date Due   Zoster Vaccines- Shingrix (1 of 2) Never done   COVID-19 Vaccine (3 - Booster for Janssen series) 10/22/2020   INFLUENZA VACCINE  12/22/2020    There are no preventive care reminders to display for this patient.   Lab Results  Component Value Date   TSH 0.295 (L) 06/25/2019   Lab Results  Component Value  Date   WBC 6.0 06/26/2020   HGB 13.4 06/26/2020   HCT 40.9 06/26/2020   MCV 92 06/26/2020   PLT 276 06/26/2020   Lab Results  Component Value Date   NA 137 06/26/2020   K 4.9 06/26/2020   CO2 23 06/26/2020   GLUCOSE 88 06/26/2020   BUN 23 06/26/2020   CREATININE 1.11 (H) 06/26/2020   BILITOT 0.2 06/26/2020   ALKPHOS 81 06/26/2020   AST 18 06/26/2020   ALT 10 06/26/2020   PROT 7.4 06/26/2020   ALBUMIN 4.3 06/26/2020   CALCIUM 9.5 06/26/2020   ANIONGAP 14 02/26/2014   Lab Results  Component Value Date   CHOL 173 06/26/2020   Lab Results  Component Value Date   HDL 46 06/26/2020   Lab Results  Component Value Date   LDLCALC 107 (H) 06/26/2020   Lab Results  Component Value Date   TRIG 109 06/26/2020   Lab Results  Component Value Date   CHOLHDL 3.8 06/26/2020   No results found for: HGBA1C     Assessment & Plan:   Problem List Items Addressed This Visit       Other   Spider bite - Primary    Symptoms not well managed, doxycycline 100 mg  Twice daily for 7 days, continue cool compress, keep feet clean and watch for signs and symptoms of infection and cellulitis, education provided to patient with printed hand out given.         Relevant Medications   doxycycline (VIBRA-TABS) 100 MG tablet     Meds ordered this encounter  Medications   doxycycline (VIBRA-TABS) 100 MG tablet    Sig: Take 1 tablet (100 mg total) by mouth 2 (two) times daily.    Dispense:  14 tablet    Refill:  0    Order Specific Question:   Supervising Provider    Answer:   Raliegh Ip [[id number]]     Daryll Drown, NP

## 2021-01-09 NOTE — Assessment & Plan Note (Signed)
Symptoms not well managed, doxycycline 100 mg  Twice daily for 7 days, continue cool compress, keep feet clean and watch for signs and symptoms of infection and cellulitis, education provided to patient with printed hand out given.

## 2021-01-09 NOTE — Patient Instructions (Signed)
Spider Bite ?Spider bites are not common. Most spider bites do not cause serious problems. There are only a few types of spider bites that can cause serious health problems. ?What are the causes? ?A spider bite is often caused by a person accidentally making contact with a spider in a way that traps the spider against the skin. ?What increases the risk? ?You are more likely to be bitten by a spider if: ?You live in an area where spiders live, and you disturb their habitat. ?You work outdoors, such as a landscaper or farmer. ?You do certain outdoor activities, such as playing in leaves or hiking. ?What are the signs or symptoms? ?Some spider bites may cause symptoms within 1 hour after the bite. For other spider bites, it may take 1-2 days for symptoms to appear. Symptoms include: ?A raised area that is red. ?Redness and swelling around the area of the bite. ?Pain in the area of the bite. ?A few types of spiders, such as the black widow or the brown recluse, can inject poison (venom) into a bite wound. This causes more serious symptoms. Symptoms of these bites vary and may include: ?Muscle cramps. ?Feeling like you may vomit (nauseous). ?Vomiting. ?Pain in your belly (abdomen). ?A fever. ?A skin sore (lesion) that spreads. This can break into an open wound (skin ulcer). ?Feeling light-headed or dizzy. ?How is this treated? ?Many spider bites do not need treatment. If needed, treatment may include: ?Icing and keeping the bite area raised (elevated). ?Taking or applying over-the-counter or prescription medicines to help with symptoms such as pain and itching. ?Having a tetanus shot. ?Taking antibiotic medicine. ?Follow these instructions at home: ?Medicines ?Take or apply over-the-counter and prescription medicines only as told by your doctor. ?If you were prescribed an antibiotic medicine, take or apply it as told by your doctor. Do not stop using it even if you start to feel better. ?Managing pain and swelling ? ?If  told, put ice on the bite area. To do this: ?Put ice in a plastic bag. ?Place a towel between your skin and the bag. ?Leave the ice on for 20 minutes, 2-3 times a day. ?Take off the ice if your skin turns bright red. This is very important. If you cannot feel pain, heat, or cold, you have a greater risk of damage to the area. ?Raise the bite area above the level of your heart while you are sitting or lying down. ?General instructions ? ?Do not scratch the bite area. ?Keep the bite area clean and dry. Wash the bite area with soap and water each day as told by your doctor. ?Keep all follow-up visits. ?Contact a doctor if: ?Your bite does not get better after 3 days. ?Your bite turns black or purple. ?Near the bite, you have more: ?Redness. ?Swelling. ?Pain. ?Get help right away if: ?You get shortness of breath or chest pain. ?You have fluid, blood, or pus coming from the bite area. ?You have painful muscle cramps or sudden muscle tightening (spasms). ?You have belly pain. ?You feel like you may vomit or you vomit. ?You feel more tired or sleepy than normal. ?These symptoms may be an emergency. Get help right away. Call your local emergency services (911 in the [place] ?Do not wait to see if the symptoms will go away. ?Do not drive yourself to the hospital. ?Summary ?Spider bites are not common. Most spider bites do not cause serious health problems. ?Take or apply all medicines only as told by your doctor. ?  Keep the bite area clean and dry. Wash the bite area with soap and water each day as told by your doctor. ?Contact a doctor if you have more redness, swelling, or pain near the bite. ?Get help right away if you get shortness of breath or chest pain. ?This information is not intended to replace advice given to you by your health care provider. Make sure you discuss any questions you have with your health care provider. ?Document Revised: 02/27/2020 Document Reviewed: 02/27/2020 ?Elsevier Patient Education ? 2022  [employer] ? ?

## 2021-01-21 ENCOUNTER — Encounter: Payer: Self-pay | Admitting: Nurse Practitioner

## 2021-01-21 ENCOUNTER — Other Ambulatory Visit: Payer: Self-pay

## 2021-01-21 ENCOUNTER — Ambulatory Visit (INDEPENDENT_AMBULATORY_CARE_PROVIDER_SITE_OTHER): Payer: Medicare HMO | Admitting: Nurse Practitioner

## 2021-01-21 VITALS — BP 103/66 | HR 80 | Temp 97.9°F | Ht 66.0 in | Wt 168.0 lb

## 2021-01-21 DIAGNOSIS — L0291 Cutaneous abscess, unspecified: Secondary | ICD-10-CM | POA: Diagnosis not present

## 2021-01-21 MED ORDER — CLINDAMYCIN HCL 300 MG PO CAPS: 300.0000 mg | ORAL_CAPSULE | Freq: Three times a day (TID) | ORAL | 0 refills | Status: DC

## 2021-01-21 NOTE — Assessment & Plan Note (Signed)
Drained abscess with moderate amount of clear liquid.  Clean with Betadine, alcohol and dressed with Vaseline gauze.  Follow-up in 3 to 4 days.  Cleocin 300 mg 3 times daily for 5 days.  Eric sent to pharmacy.  Education provided to patient on how to redress abscess and keep it clean, watching out for signs and symptoms of wound infection.  Patient verbalized understanding.  Printed handouts given.

## 2021-01-21 NOTE — Progress Notes (Signed)
Acute Office Visit  Subjective:    Patient ID: Wendy Arnold, female    DOB: 1950-07-12, 70 y.o.   MRN: [id number]  Chief Complaint  Patient presents with   Insect Bite    HPI   Patient is in today for follow-up of spider bite.  Bite is not healing properly, abscess will need to be drained. Abscess: Patient presents for evaluation of a cutaneous abscess. Lesion is located in the left second toe. Onset was 7 days ago. Symptoms have gradually worsened. Abscess has associated symptoms of none, pain. Patient does not have previous history of cutaneous abscesses. Patient does not have diabetes.   .  Past Medical History:  Diagnosis Date   Anxiety    Back pain    Depression    Diverticulitis    Insomnia    Migraines     Past Surgical History:  Procedure Laterality Date   ABDOMINAL HYSTERECTOMY     BACK SURGERY     SINUS ENDO WITH FUSION     TOE SURGERY      Family History  Problem Relation Age of Onset   Heart failure Mother    Cancer Father    Cancer Brother    Cancer Brother    Cancer Brother    Breast cancer Neg Hx     Social History   Socioeconomic History   Marital status: Divorced    Spouse name: Not on file   Number of children: Not on file   Years of education: Not on file   Highest education level: Not on file  Occupational History   Occupation: retired  Tobacco Use   Smoking status: Every Day    Packs/day: 0.25    Years: 40.00    Pack years: 10.00    Types: Cigarettes   Smokeless tobacco: Never  Substance and Sexual Activity   Alcohol use: Not Currently    Comment: rarely   Drug use: No   Sexual activity: Never    Birth control/protection: Surgical  Other Topics Concern   Not on file  Social History Narrative   Her mentally disabled brother lives with her   Social Determinants of Health   Financial Resource Strain: Low Risk    Difficulty of Paying Living Expenses: Not hard at all  Food Insecurity: No Food Insecurity   Worried  About Programme researcher, broadcasting/film/video in the Last Year: Never true   Barista in the Last Year: Never true  Transportation Needs: No Transportation Needs   Lack of Transportation (Medical): No   Lack of Transportation (Non-Medical): No  Physical Activity: Inactive   Days of Exercise per Week: 0 days   Minutes of Exercise per Session: 0 min  Stress: No Stress Concern Present   Feeling of Stress : Only a little  Social Connections: Socially Isolated   Frequency of Communication with Friends and Family: More than three times a week   Frequency of Social Gatherings with Friends and Family: More than three times a week   Attends Religious Services: Never   Database administrator or Organizations: No   Attends Engineer, structural: Never   Marital Status: Divorced  Catering manager Violence: Not At Risk   Fear of Current or Ex-Partner: No   Emotionally Abused: No   Physically Abused: No   Sexually Abused: No    Outpatient Medications Prior to Visit  Medication Sig Dispense Refill   ALPRAZolam (XANAX) 0.5 MG tablet Take 1 tablet (  0.5 mg total) by mouth 2 (two) times daily as needed for anxiety. 60 tablet 5   amLODipine-benazepril (LOTREL) 10-20 MG capsule Take 1 capsule by mouth daily. 90 capsule 1   aspirin EC 81 MG tablet Take 81 mg by mouth at bedtime.      bacitracin-polymyxin b (POLYSPORIN) ophthalmic ointment Place 1 application into both eyes 2 (two) times daily. apply to eye every 12 hours while awake 7 g 0   chlorhexidine (PERIDEX) 0.12 % solution SMARTSIG:0.5 Ounce(s) By Mouth Twice Daily     gabapentin (NEURONTIN) 300 MG capsule Take 1 capsule (300 mg total) by mouth 3 (three) times daily. 270 capsule 1   HYDROcodone-acetaminophen (NORCO) 10-325 MG tablet Take 1 tablet by mouth every 6 (six) hours as needed.     ibuprofen (ADVIL) 800 MG tablet Take 1 tablet (800 mg total) by mouth every 8 (eight) hours as needed. 30 tablet 0   Multiple Vitamins-Minerals (ALIVE WOMENS 50+ PO)  Take by mouth.     rosuvastatin (CRESTOR) 10 MG tablet Take 1 tablet (10 mg total) by mouth daily. 90 tablet 1   traZODone (DESYREL) 150 MG tablet Take 1 tablet (150 mg total) by mouth at bedtime. 90 tablet 1   doxycycline (VIBRA-TABS) 100 MG tablet Take 1 tablet (100 mg total) by mouth 2 (two) times daily. 14 tablet 0   No facility-administered medications prior to visit.    Allergies  Allergen Reactions   Amoxicillin-Pot Clavulanate    Flagyl [Metronidazole] Nausea And Vomiting and Other (See Comments)    Severe headaches; leg cramps.    Review of Systems  Constitutional: Negative.   HENT: Negative.    Respiratory: Negative.    Cardiovascular: Negative.   Skin:  Positive for wound.  All other systems reviewed and are negative.     Objective:    Physical Exam Vitals and nursing note reviewed.  Constitutional:      Appearance: Normal appearance.  HENT:     Head: Normocephalic.     Right Ear: External ear normal.     Left Ear: External ear normal.     Nose: Nose normal.  Eyes:     Conjunctiva/sclera: Conjunctivae normal.  Cardiovascular:     Rate and Rhythm: Normal rate and regular rhythm.     Pulses: Normal pulses.     Heart sounds: Normal heart sounds.  Pulmonary:     Effort: Pulmonary effort is normal.     Breath sounds: Normal breath sounds.  Abdominal:     General: Bowel sounds are normal.  Skin:    General: Skin is warm.     Findings: Abscess present.       Neurological:     Mental Status: She is alert.    BP 103/66   Pulse 80   Temp 97.9 F (36.6 C) (Temporal)   Ht 5\' 6"  (1.676 m)   Wt 168 lb (76.2 kg)   SpO2 98%   BMI 27.12 kg/m  Wt Readings from Last 3 Encounters:  01/21/21 168 lb (76.2 kg)  01/09/21 169 lb (76.7 kg)  08/26/20 160 lb (72.6 kg)    Health Maintenance Due  Topic Date Due   Zoster Vaccines- Shingrix (1 of 2) Never done   COVID-19 Vaccine (3 - Booster for Janssen series) 10/22/2020   INFLUENZA VACCINE  12/22/2020     There are no preventive care reminders to display for this patient.   Lab Results  Component Value Date   TSH 0.295 (L) 06/25/2019  Lab Results  Component Value Date   WBC 6.0 06/26/2020   HGB 13.4 06/26/2020   HCT 40.9 06/26/2020   MCV 92 06/26/2020   PLT 276 06/26/2020   Lab Results  Component Value Date   NA 137 06/26/2020   K 4.9 06/26/2020   CO2 23 06/26/2020   GLUCOSE 88 06/26/2020   BUN 23 06/26/2020   CREATININE 1.11 (H) 06/26/2020   BILITOT 0.2 06/26/2020   ALKPHOS 81 06/26/2020   AST 18 06/26/2020   ALT 10 06/26/2020   PROT 7.4 06/26/2020   ALBUMIN 4.3 06/26/2020   CALCIUM 9.5 06/26/2020   ANIONGAP 14 02/26/2014   Lab Results  Component Value Date   CHOL 173 06/26/2020   Lab Results  Component Value Date   HDL 46 06/26/2020   Lab Results  Component Value Date   LDLCALC 107 (H) 06/26/2020   Lab Results  Component Value Date   TRIG 109 06/26/2020   Lab Results  Component Value Date   CHOLHDL 3.8 06/26/2020   No results found for: HGBA1C     Assessment & Plan:   Problem List Items Addressed This Visit       Other   Abscess - Primary    Drained abscess with moderate amount of clear liquid.  Clean with Betadine, alcohol and dressed with Vaseline gauze.  Follow-up in 3 to 4 days.  Cleocin 300 mg 3 times daily for 5 days.  Eric sent to pharmacy.  Education provided to patient on how to redress abscess and keep it clean, watching out for signs and symptoms of wound infection.  Patient verbalized understanding.  Printed handouts given.      Relevant Medications   clindamycin (CLEOCIN) 300 MG capsule     Meds ordered this encounter  Medications   clindamycin (CLEOCIN) 300 MG capsule    Sig: Take 1 capsule (300 mg total) by mouth 3 (three) times daily.    Dispense:  15 capsule    Refill:  0    Order Specific Question:   Supervising Provider    Answer:   Raliegh Ip [[id number]]     Daryll Drown, NP

## 2021-01-21 NOTE — Patient Instructions (Signed)
Skin Abscess  A skin abscess is an infected area on or under your skin that contains a collection of pus and other material. An abscess may also be called a furuncle,carbuncle, or boil. An abscess can occur in or on almost any part of your body. Some abscesses break open (rupture) on their own. Most continue to get worse unless they are treated. The infection can spread deeper into the body and eventually into your blood, whichcan make you feel ill. Treatment usually involves draining the abscess. What are the causes? An abscess occurs when germs, like bacteria, pass through your skin and cause an infection. This may be caused by: A scrape or cut on your skin. A puncture wound through your skin, including a needle injection or insect bite. Blocked oil or sweat glands. Blocked and infected hair follicles. A cyst that forms beneath your skin (sebaceous cyst) and becomes infected. What increases the risk? This condition is more likely to develop in people who: Have a weak body defense system (immune system). Have diabetes. Have dry and irritated skin. Get frequent injections or use illegal IV drugs. Have a foreign body in a wound, such as a splinter. Have problems with their lymph system or veins. What are the signs or symptoms? Symptoms of this condition include: A painful, firm bump under the skin. A bump with pus at the top. This may break through the skin and drain. Other symptoms include: Redness surrounding the abscess site. Warmth. Swelling of the lymph nodes (glands) near the abscess. Tenderness. A sore on the skin. How is this diagnosed? This condition may be diagnosed based on: A physical exam. Your medical history. A sample of pus. This may be used to find out what is causing the infection. Blood tests. Imaging tests, such as an ultrasound, CT scan, or MRI. How is this treated? A small abscess that drains on its own may not need treatment. Treatment for larger abscesses  may include: Moist heat or heat pack applied to the area several times a day. A procedure to drain the abscess (incision and drainage). Antibiotic medicines. For a severe abscess, you may first get antibiotics through an IV and then change to antibiotics by mouth. Follow these instructions at home: Medicines  Take over-the-counter and prescription medicines only as told by your health care provider. If you were prescribed an antibiotic medicine, take it as told by your health care provider. Do not stop taking the antibiotic even if you start to feel better.  Abscess care  If you have an abscess that has not drained, apply heat to the affected area. Use the heat source that your health care provider recommends, such as a moist heat pack or a heating pad. Place a towel between your skin and the heat source. Leave the heat on for 20-30 minutes. Remove the heat if your skin turns bright red. This is especially important if you are unable to feel pain, heat, or cold. You may have a greater risk of getting burned. Follow instructions from your health care provider about how to take care of your abscess. Make sure you: Cover the abscess with a bandage (dressing). Change your dressing or gauze as told by your health care provider. Wash your hands with soap and water before you change the dressing or gauze. If soap and water are not available, use hand sanitizer. Check your abscess every day for signs of a worsening infection. Check for: More redness, swelling, or pain. More fluid or blood. Warmth. More   pus or a bad smell.  General instructions To avoid spreading the infection: Do not share personal care items, towels, or hot tubs with others. Avoid making skin contact with other people. Keep all follow-up visits as told by your health care provider. This is important. Contact a health care provider if you have: More redness, swelling, or pain around your abscess. More fluid or blood coming  from your abscess. Warm skin around your abscess. More pus or a bad smell coming from your abscess. A fever. Muscle aches. Chills or a general ill feeling. Get help right away if you: Have severe pain. See red streaks on your skin spreading away from the abscess. Summary A skin abscess is an infected area on or under your skin that contains a collection of pus and other material. A small abscess that drains on its own may not need treatment. Treatment for larger abscesses may include having a procedure to drain the abscess and taking an antibiotic. This information is not intended to replace advice given to you by your health care provider. Make sure you discuss any questions you have with your healthcare provider. Document Revised: 08/31/2018 Document Reviewed: 06/23/2017 Elsevier Patient Education  2022 [employer] Inc.  

## 2021-01-27 ENCOUNTER — Other Ambulatory Visit: Payer: Self-pay

## 2021-01-27 ENCOUNTER — Encounter: Payer: Self-pay | Admitting: Nurse Practitioner

## 2021-01-27 ENCOUNTER — Ambulatory Visit (INDEPENDENT_AMBULATORY_CARE_PROVIDER_SITE_OTHER): Payer: Medicare HMO | Admitting: Nurse Practitioner

## 2021-01-27 VITALS — BP 138/80 | HR 71 | Temp 97.3°F | Ht 66.0 in | Wt 168.0 lb

## 2021-01-27 DIAGNOSIS — L0291 Cutaneous abscess, unspecified: Secondary | ICD-10-CM

## 2021-01-27 NOTE — Progress Notes (Signed)
Acute Office Visit  Subjective:    Patient ID: Wendy Arnold, female    DOB: 15-Jan-1951, 70 y.o.   MRN: [id number]  Chief Complaint  Patient presents with   Abscess    Abscess  Patient is in today for Wound Check: Patient presents for wound check. Patient has a abscess wound which is located on the left foot. Current symptoms: wound healing as expected. Symptoms began 2 weeks ago. Pain is rated 0/10. Interventions to date: dressing changed 3 days ago and started on antibiotics 3 days ago.   Past Medical History:  Diagnosis Date   Anxiety    Back pain    Depression    Diverticulitis    Insomnia    Migraines     Past Surgical History:  Procedure Laterality Date   ABDOMINAL HYSTERECTOMY     BACK SURGERY     SINUS ENDO WITH FUSION     TOE SURGERY      Family History  Problem Relation Age of Onset   Heart failure Mother    Cancer Father    Cancer Brother    Cancer Brother    Cancer Brother    Breast cancer Neg Hx     Social History   Socioeconomic History   Marital status: Divorced    Spouse name: Not on file   Number of children: Not on file   Years of education: Not on file   Highest education level: Not on file  Occupational History   Occupation: retired  Tobacco Use   Smoking status: Every Day    Packs/day: 0.25    Years: 40.00    Pack years: 10.00    Types: Cigarettes   Smokeless tobacco: Never  Substance and Sexual Activity   Alcohol use: Not Currently    Comment: rarely   Drug use: No   Sexual activity: Never    Birth control/protection: Surgical  Other Topics Concern   Not on file  Social History Narrative   Her mentally disabled brother lives with her   Social Determinants of Health   Financial Resource Strain: Low Risk    Difficulty of Paying Living Expenses: Not hard at all  Food Insecurity: No Food Insecurity   Worried About Programme researcher, broadcasting/film/video in the Last Year: Never true   Barista in the Last Year: Never true   Transportation Needs: No Transportation Needs   Lack of Transportation (Medical): No   Lack of Transportation (Non-Medical): No  Physical Activity: Inactive   Days of Exercise per Week: 0 days   Minutes of Exercise per Session: 0 min  Stress: No Stress Concern Present   Feeling of Stress : Only a little  Social Connections: Socially Isolated   Frequency of Communication with Friends and Family: More than three times a week   Frequency of Social Gatherings with Friends and Family: More than three times a week   Attends Religious Services: Never   Database administrator or Organizations: No   Attends Engineer, structural: Never   Marital Status: Divorced  Catering manager Violence: Not At Risk   Fear of Current or Ex-Partner: No   Emotionally Abused: No   Physically Abused: No   Sexually Abused: No    Outpatient Medications Prior to Visit  Medication Sig Dispense Refill   ALPRAZolam (XANAX) 0.5 MG tablet Take 1 tablet (0.5 mg total) by mouth 2 (two) times daily as needed for anxiety. 60 tablet 5  amLODipine-benazepril (LOTREL) 10-20 MG capsule Take 1 capsule by mouth daily. 90 capsule 1   aspirin EC 81 MG tablet Take 81 mg by mouth at bedtime.      bacitracin-polymyxin b (POLYSPORIN) ophthalmic ointment Place 1 application into both eyes 2 (two) times daily. apply to eye every 12 hours while awake 7 g 0   chlorhexidine (PERIDEX) 0.12 % solution SMARTSIG:0.5 Ounce(s) By Mouth Twice Daily     clindamycin (CLEOCIN) 300 MG capsule Take 1 capsule (300 mg total) by mouth 3 (three) times daily. 15 capsule 0   gabapentin (NEURONTIN) 300 MG capsule Take 1 capsule (300 mg total) by mouth 3 (three) times daily. 270 capsule 1   HYDROcodone-acetaminophen (NORCO) 10-325 MG tablet Take 1 tablet by mouth every 6 (six) hours as needed.     ibuprofen (ADVIL) 800 MG tablet Take 1 tablet (800 mg total) by mouth every 8 (eight) hours as needed. 30 tablet 0   Multiple Vitamins-Minerals (ALIVE  WOMENS 50+ PO) Take by mouth.     rosuvastatin (CRESTOR) 10 MG tablet Take 1 tablet (10 mg total) by mouth daily. 90 tablet 1   traZODone (DESYREL) 150 MG tablet Take 1 tablet (150 mg total) by mouth at bedtime. 90 tablet 1   No facility-administered medications prior to visit.    Allergies  Allergen Reactions   Amoxicillin-Pot Clavulanate    Flagyl [Metronidazole] Nausea And Vomiting and Other (See Comments)    Severe headaches; leg cramps.    Review of Systems  HENT: Negative.    Eyes: Negative.   Respiratory: Negative.    Skin:  Positive for wound.  All other systems reviewed and are negative.     Objective:    Physical Exam Vitals reviewed.  Constitutional:      Appearance: Normal appearance.  HENT:     Head: Normocephalic.     Nose: Nose normal.  Eyes:     Conjunctiva/sclera: Conjunctivae normal.  Cardiovascular:     Rate and Rhythm: Normal rate and regular rhythm.  Pulmonary:     Effort: Pulmonary effort is normal.     Breath sounds: Normal breath sounds.  Skin:    General: Skin is warm.     Findings: Abscess present. No rash.    BP 138/80   Pulse 71   Temp (!) 97.3 F (36.3 C) (Temporal)   Ht 5\' 6"  (1.676 m)   Wt 168 lb (76.2 kg)   BMI 27.12 kg/m  Wt Readings from Last 3 Encounters:  01/27/21 168 lb (76.2 kg)  01/21/21 168 lb (76.2 kg)  01/09/21 169 lb (76.7 kg)    Health Maintenance Due  Topic Date Due   Zoster Vaccines- Shingrix (1 of 2) Never done   COVID-19 Vaccine (3 - Booster for Janssen series) 10/22/2020   INFLUENZA VACCINE  12/22/2020    There are no preventive care reminders to display for this patient.   Lab Results  Component Value Date   TSH 0.295 (L) 06/25/2019   Lab Results  Component Value Date   WBC 6.0 06/26/2020   HGB 13.4 06/26/2020   HCT 40.9 06/26/2020   MCV 92 06/26/2020   PLT 276 06/26/2020   Lab Results  Component Value Date   NA 137 06/26/2020   K 4.9 06/26/2020   CO2 23 06/26/2020   GLUCOSE 88  06/26/2020   BUN 23 06/26/2020   CREATININE 1.11 (H) 06/26/2020   BILITOT 0.2 06/26/2020   ALKPHOS 81 06/26/2020   AST 18 06/26/2020  ALT 10 06/26/2020   PROT 7.4 06/26/2020   ALBUMIN 4.3 06/26/2020   CALCIUM 9.5 06/26/2020   ANIONGAP 14 02/26/2014   Lab Results  Component Value Date   CHOL 173 06/26/2020   Lab Results  Component Value Date   HDL 46 06/26/2020   Lab Results  Component Value Date   LDLCALC 107 (H) 06/26/2020   Lab Results  Component Value Date   TRIG 109 06/26/2020   Lab Results  Component Value Date   CHOLHDL 3.8 06/26/2020   No results found for: HGBA1C     Assessment & Plan:   Problem List Items Addressed This Visit       Other   Abscess - Primary    Abscess follow-up.  Patient completed antibiotics no new concerns today, healing as expected.  Follow-up as needed.        No orders of the defined types were placed in this encounter.    Daryll Drown, NP

## 2021-01-27 NOTE — Assessment & Plan Note (Signed)
Abscess follow-up.  Patient completed antibiotics no new concerns today, healing as expected.  Follow-up as needed.

## 2021-03-01 ENCOUNTER — Other Ambulatory Visit: Payer: Self-pay | Admitting: Family Medicine

## 2021-03-01 DIAGNOSIS — F5101 Primary insomnia: Secondary | ICD-10-CM

## 2021-03-23 ENCOUNTER — Ambulatory Visit: Payer: Medicare HMO | Admitting: Nurse Practitioner

## 2021-03-24 ENCOUNTER — Encounter: Payer: Self-pay | Admitting: Nurse Practitioner

## 2021-03-27 ENCOUNTER — Other Ambulatory Visit: Payer: Self-pay

## 2021-03-27 ENCOUNTER — Encounter: Payer: Medicare HMO | Attending: Physical Medicine and Rehabilitation | Admitting: Physical Medicine and Rehabilitation

## 2021-03-27 ENCOUNTER — Encounter: Payer: Self-pay | Admitting: Physical Medicine and Rehabilitation

## 2021-03-27 VITALS — BP 127/77 | HR 83 | Temp 98.9°F | Ht 66.0 in | Wt 170.6 lb

## 2021-03-27 DIAGNOSIS — M5430 Sciatica, unspecified side: Secondary | ICD-10-CM | POA: Insufficient documentation

## 2021-03-27 DIAGNOSIS — G8929 Other chronic pain: Secondary | ICD-10-CM | POA: Diagnosis not present

## 2021-03-27 DIAGNOSIS — M5442 Lumbago with sciatica, left side: Secondary | ICD-10-CM | POA: Insufficient documentation

## 2021-03-27 MED ORDER — DULOXETINE HCL 30 MG PO CPEP: 30.0000 mg | ORAL_CAPSULE | Freq: Every day | ORAL | 3 refills | Status: DC

## 2021-03-27 MED ORDER — CYCLOBENZAPRINE HCL 10 MG PO TABS: 10.0000 mg | ORAL_TABLET | Freq: Two times a day (BID) | ORAL | 3 refills | Status: DC | PRN

## 2021-03-27 NOTE — Progress Notes (Signed)
Subjective:    Patient ID: Wendy Arnold, female    DOB: 1951-04-22, 70 y.o.   MRN: [id number]  HPI  Pt is a 70 yr old female sent to be for chronic low back pain with sciatica and nerve pain. Doesn't have DM; quit taking statins- due to pain; and has HLD;   Here for evaluation of chronic back pain/sciatica.   Pt drawing shows basically pain from low back to feet and also in neck    Standing due to pain- Rode here sitting, so hasn't had stood much.    Started with back pain in 1996- has low back surgery- In last 3-4 years- in  Feels like thighs ready to explode and bottom of feet started 3-4 months ago-  burn like crazy.   Most pain is low back;    On Xanax 0.5 mg BID for anxiety Buried son and many family members and has mentally challenged brother who she cares for and he hates her because had to move to rental- couldn't fix family home    Tried: Starts out with ice; ibuuprofen 4-8 pills/day Does some leg exercises Has taken some Norco 10/325 mg from a friend.  Heat doesn't work On gabapentin- 300 mg 3-4x/day- scheduled for 3x/day.  Hasn't tried Duloxetine; hasn't tried Lyrica.    Has a hard time sleeping as well- 15 days/month doesn't sleep- on Trazodone for sleep- for decades.    Tired of hurting- but upset that patients have more problems than her.     Sometimes flexeril helped some, but made sleepy.    House is "atrocious" because of pain; cannot keep it clean.  Cooks 4 nights/week and sandwiches otherwise.    MRI lumbar spine 01/2013 IMPRESSION:  1. Widespread lumbar disc degeneration. Occasional superimposed  posterior element degeneration. Multifactorial mild spinal, left  greater than right lateral recess stenosis, and foraminal stenosis  at L3-L4.   2. Postoperative changes suspected at L5-S1. Chronic severe  multifactorial bilateral L5 foraminal stenosis, primarily due to  osseous spurring as depicted on CT 07/24/2011.   3. L4-L5  multifactorial mild left lateral recess stenosis and  moderate left L4 foraminal stenosis.   Pain Inventory Average Pain 7 Pain Right Now 7 My pain is sharp, burning, stabbing, and aching  In the last 24 hours, has pain interfered with the following? General activity 8 Relation with others 6 Enjoyment of life 8 What TIME of day is your pain at its worst? morning  and night Sleep (in general) Poor  Pain is worse with: unsure Pain improves with: heat/ice, medication, and TENS Relief from Meds: 9  walk without assistance walk with assistance how many minutes can you walk? 10-20 ability to climb steps?  yes do you drive?  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  no  Any changes since last visit?  no Primary care Paulene Floor NP    Family History  Problem Relation Age of Onset   Heart failure Mother    Cancer Father    Cancer Brother    Cancer Brother    Cancer Brother    Breast cancer Neg Hx    Social History   Socioeconomic History   Marital status: Divorced    Spouse name: Not on file   Number of children: Not on file   Years of education:  Not on file   Highest education level: Not on file  Occupational History   Occupation: retired  Tobacco Use   Smoking status: Every Day    Packs/day: 0.25    Years: 40.00    Pack years: 10.00    Types: Cigarettes   Smokeless tobacco: Never  Substance and Sexual Activity   Alcohol use: Not Currently    Comment: rarely   Drug use: No   Sexual activity: Never    Birth control/protection: Surgical  Other Topics Concern   Not on file  Social History Narrative   Her mentally disabled brother lives with her   Social Determinants of Health   Financial Resource Strain: Low Risk    Difficulty of Paying Living Expenses: Not hard at all  Food Insecurity: No Food Insecurity    Worried About Programme researcher, broadcasting/film/video in the Last Year: Never true   Barista in the Last Year: Never true  Transportation Needs: No Transportation Needs   Lack of Transportation (Medical): No   Lack of Transportation (Non-Medical): No  Physical Activity: Inactive   Days of Exercise per Week: 0 days   Minutes of Exercise per Session: 0 min  Stress: No Stress Concern Present   Feeling of Stress : Only a little  Social Connections: Socially Isolated   Frequency of Communication with Friends and Family: More than three times a week   Frequency of Social Gatherings with Friends and Family: More than three times a week   Attends Religious Services: Never   Database administrator or Organizations: No   Attends Engineer, structural: Never   Marital Status: Divorced   Past Surgical History:  Procedure Laterality Date   ABDOMINAL HYSTERECTOMY     BACK SURGERY     SINUS ENDO WITH FUSION     TOE SURGERY     Past Medical History:  Diagnosis Date   Anxiety    Back pain    Depression    Diverticulitis    Insomnia    Migraines    BP 127/77   Pulse 83   Temp 98.9 F (37.2 C)   Ht 5\' 6"  (1.676 m)   Wt 170 lb 9.6 oz (77.4 kg)   SpO2 94%   BMI 27.54 kg/m   Opioid Risk Score:   Fall Risk Score:  `1  Depression screen PHQ 2/9  Depression screen [facility] 2/9 03/27/2021 01/27/2021 01/09/2021 12/19/2020 08/26/2020 08/07/2020 06/26/2020  Decreased Interest 2 0 2 1 0 0 0  Down, Depressed, Hopeless 2 0 3 2 0 0 0  PHQ - 2 Score 4 0 5 3 0 0 0  Altered sleeping 3 - 3 2 - - -  Tired, decreased energy 3 - 3 1 - - -  Change in appetite 2 - 1 0 - - -  Feeling bad or failure about yourself  1 - 2 0 - - -  Trouble concentrating 2 - 1 1 - - -  Moving slowly or fidgety/restless 3 - 0 0 - - -  Suicidal thoughts 0 - 0 0 - - -  PHQ-9 Score 18 - 15 7 - - -  Difficult doing work/chores Very difficult - Somewhat difficult Somewhat difficult - - -     Review of Systems  Constitutional: Negative.    HENT: Negative.    Eyes: Negative.   Respiratory: Negative.    Cardiovascular: Negative.   Gastrointestinal: Negative.   Endocrine: Negative.   Genitourinary: Negative.  Musculoskeletal:  Positive for back pain.  Skin: Negative.   Allergic/Immunologic: Negative.   Neurological: Negative.   Hematological: Negative.   Psychiatric/Behavioral:  Positive for dysphoric mood. The patient is nervous/anxious.   All other systems reviewed and are negative.     Objective:   Physical Exam  Awake, alert, appropriate, but very anxious; tearful intermittently; No acute distress (+) SLR B/L-  MS: when tested, legs are tremoring vs spasming with ROM HF 5-/5; KE 5-/5; DF/PF 5-/5 B/L  A few muscle spasms in calves and toes with ROM Extension and rotation makes pain worse B/L Has significant trigger points palpated in lumbar/sacral paraspinals  B/L- palpable.       Assessment & Plan:    Pt is a 70 yr old female sent to be for chronic low back pain with sciatica and nerve pain. Doesn't have DM; quit taking statins- due to pain; and has HLD;   Here for evaluation of chronic back pain/sciatica.   Will try Duloxetine- 30 mg daily vs nightly- x 1 week, then increase to 60 mg daily-  - can cause 1-2% nausea- if gets, lasts 7 days.  Sent in 1 month supply- with 3 refills.   2. Will also write for Flexeril/Cyclobenzaprine- 10 mg up to 2x/day AS NEEDED- don't take more than 5 days/week. So your body doesn't get used to it and it still works. Can take 1/2 if it makes you too sleepy.    3.  Continue gabapentin for now- call me in 1 month to let me know how things doing- and then can talk about coming off Gabapentin.    4. Can continue ibuprofen- just don't take as much. Due to kidney function being less.   5. F/U  in 2 months- to see how is doing pain wise.   I spent a total of 37 minutes on visit- as detailed above. Going over plan.

## 2021-03-27 NOTE — Patient Instructions (Signed)
Pt is a 70 yr old female sent to be for chronic low back pain with sciatica and nerve pain. Doesn't have DM; quit taking statins- due to pain; and has HLD;   Here for evaluation of chronic back pain/sciatica.   Will try Duloxetine- 30 mg daily vs nightly- x 1 week, then increase to 60 mg daily-  - can cause 1-2% nausea- if gets, lasts 7 days.  Sent in 1 month supply- with 3 refills.   2. Will also write for Flexeril/Cyclobenzaprine- 10 mg up to 2x/day AS NEEDED- don't take more than 5 days/week. So your body doesn't get used to it and it still works. Can take 1/2 if it makes you too sleepy.    3.  Continue gabapentin for now- call me in 1 month to let me know how things doing- and then can talk about coming off Gabapentin.    4. Can continue ibuprofen- just don't take as much. Due to kidney function being less.   5. F/U  in 2 months- to see how is doing pain wise.

## 2021-03-30 ENCOUNTER — Encounter: Payer: Self-pay | Admitting: Nurse Practitioner

## 2021-03-30 ENCOUNTER — Ambulatory Visit (INDEPENDENT_AMBULATORY_CARE_PROVIDER_SITE_OTHER): Payer: Medicare HMO | Admitting: Nurse Practitioner

## 2021-03-30 ENCOUNTER — Other Ambulatory Visit: Payer: Self-pay

## 2021-03-30 VITALS — BP 138/82 | HR 87 | Temp 97.8°F | Resp 20 | Ht 66.0 in | Wt 172.0 lb

## 2021-03-30 DIAGNOSIS — I2510 Atherosclerotic heart disease of native coronary artery without angina pectoris: Secondary | ICD-10-CM

## 2021-03-30 DIAGNOSIS — G8929 Other chronic pain: Secondary | ICD-10-CM

## 2021-03-30 DIAGNOSIS — F5101 Primary insomnia: Secondary | ICD-10-CM

## 2021-03-30 DIAGNOSIS — J410 Simple chronic bronchitis: Secondary | ICD-10-CM

## 2021-03-30 DIAGNOSIS — M5442 Lumbago with sciatica, left side: Secondary | ICD-10-CM | POA: Diagnosis not present

## 2021-03-30 DIAGNOSIS — F17210 Nicotine dependence, cigarettes, uncomplicated: Secondary | ICD-10-CM

## 2021-03-30 DIAGNOSIS — F411 Generalized anxiety disorder: Secondary | ICD-10-CM

## 2021-03-30 DIAGNOSIS — E782 Mixed hyperlipidemia: Secondary | ICD-10-CM | POA: Diagnosis not present

## 2021-03-30 DIAGNOSIS — I10 Essential (primary) hypertension: Secondary | ICD-10-CM | POA: Diagnosis not present

## 2021-03-30 DIAGNOSIS — I2583 Coronary atherosclerosis due to lipid rich plaque: Secondary | ICD-10-CM

## 2021-03-30 DIAGNOSIS — Z23 Encounter for immunization: Secondary | ICD-10-CM

## 2021-03-30 NOTE — Progress Notes (Signed)
Subjective:    Patient ID: Wendy Arnold, female    DOB: [birth year], 70 y.o.   MRN: [id number]   Chief Complaint: Medical Management of Chronic Issues    HPI:  1. Essential hypertension No c/o chest pain, sob or headache. DOes not check on blood pressure at home. BP Readings from Last 3 Encounters:  03/30/21 138/82  03/27/21 127/77  01/27/21 138/80     2. Coronary artery disease due to lipid rich plaque She has not seen cardiology in a few years.  3. Simple chronic bronchitis ([place]) She is not on any inhalers for maintenance. She uses albuterol maybe 1-2X a month She is still a smoker. 4. Chronic midline low back pain with left-sided sciatica Has chronic back pain. She just saw pain management yesterday. She is suppose to start in cymbalta with her gabapentin and see if will help.  5. Mixed hyperlipidemia Patient stopped taking statin about 9 months ago due to body aches. Sh eis not watching her diet at all.  6. Primary insomnia Not sleeping good. She takes trazadone and it has not ben helping.  7. GAD (generalized anxiety disorder) She is on xanax BID. Hopefully the cymbalta will also help with her depression. GAD 7 : Generalized Anxiety Score 12/19/2020 06/26/2020 09/24/2019 06/25/2019  Nervous, Anxious, on Edge 2 3 2 2   Control/stop worrying 1 2 3 3   Worry too much - different things 1 2 3 3   Trouble relaxing 0 1 1 2   Restless 0 0 0 2  Easily annoyed or irritable 1 1 1 2   Afraid - awful might happen 1 2 2  0  Total GAD 7 Score 6 11 12 14   Anxiety Difficulty Somewhat difficult Somewhat difficult Not difficult at all Somewhat difficult    Depression screen [facility] 2/9 03/27/2021 01/27/2021 01/09/2021  Decreased Interest 2 0 2  Down, Depressed, Hopeless 2 0 3  PHQ - 2 Score 4 0 5  Altered sleeping 3 - 3  Tired, decreased energy 3 - 3  Change in appetite 2 - 1  Feeling bad or failure about yourself  1 - 2  Trouble concentrating 2 - 1  Moving slowly or fidgety/restless 3 -  0  Suicidal thoughts 0 - 0  PHQ-9 Score 18 - 15  Difficult doing work/chores Very difficult - Somewhat difficult      Outpatient Encounter Medications as of 03/30/2021  Medication Sig   ALPRAZolam (XANAX) 0.5 MG tablet Take 1 tablet (0.5 mg total) by mouth 2 (two) times daily as needed for anxiety.   amLODipine-benazepril (LOTREL) 10-20 MG capsule Take 1 capsule by mouth daily.   aspirin EC 81 MG tablet Take 81 mg by mouth at bedtime.    bacitracin-polymyxin b (POLYSPORIN) ophthalmic ointment Place 1 application into both eyes 2 (two) times daily. apply to eye every 12 hours while awake   chlorhexidine (PERIDEX) 0.12 % solution SMARTSIG:0.5 Ounce(s) By Mouth Twice Daily   cyclobenzaprine (FLEXERIL) 10 MG tablet Take 1 tablet (10 mg total) by mouth 2 (two) times daily as needed for muscle spasms.   DULoxetine (CYMBALTA) 30 MG capsule Take 1 capsule (30 mg total) by mouth daily. X 1 week, then 60 mg daily- for nerve and back pain-take with food.   gabapentin (NEURONTIN) 300 MG capsule Take 1 capsule (300 mg total) by mouth 3 (three) times daily.   ibuprofen (ADVIL) 800 MG tablet Take 1 tablet (800 mg total) by mouth every 8 (eight) hours as needed.   Multiple  Vitamins-Minerals (ALIVE WOMENS 50+ PO) Take by mouth.   rosuvastatin (CRESTOR) 10 MG tablet Take 1 tablet (10 mg total) by mouth daily.   traZODone (DESYREL) 150 MG tablet TAKE 1 TABLET AT BEDTIME. MAY TAKE $Remove'150MG'oiDsrfK$  AS PRESCRIBED   [DISCONTINUED] clindamycin (CLEOCIN) 300 MG capsule Take 1 capsule (300 mg total) by mouth 3 (three) times daily.   No facility-administered encounter medications on file as of 03/30/2021.    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  Eyes:  Negative for pain.  Respiratory:  Negative for shortness of breath.   Cardiovascular:  Negative for chest pain, palpitations and leg swelling.  Gastrointestinal:  Negative for abdominal pain.  Endocrine: Negative for polydipsia.  Skin:  Negative for rash.  Neurological:  Negative for dizziness, weakness and headaches.  Hematological:  Does not bruise/bleed easily.  All other systems reviewed and are negative.     Objective:   Physical Exam Vitals and nursing note reviewed.  Constitutional:      General: She is not in acute distress.    Appearance: Normal appearance. She is well-developed.  HENT:     Head: Normocephalic.     Right Ear: Tympanic membrane normal.     Left Ear: Tympanic membrane normal.     Nose: Nose normal.     Mouth/Throat:     Mouth: Mucous membranes are moist.  Eyes:     Pupils: Pupils are equal, round, and reactive to light.  Neck:     Vascular: No carotid bruit or JVD.  Cardiovascular:     Rate and Rhythm: Normal rate and regular rhythm.     Heart sounds: Normal heart sounds.  Pulmonary:     Effort: Pulmonary effort is normal. No respiratory distress.     Breath sounds: Normal breath sounds. No wheezing or rales.  Chest:     Chest wall: No tenderness.  Abdominal:     General: Bowel sounds are normal. There is no distension or abdominal bruit.     Palpations: Abdomen is soft. There is no hepatomegaly, splenomegaly, mass or pulsatile mass.     Tenderness: There is no abdominal tenderness.  Musculoskeletal:        General: Normal range of motion.     Cervical back: Normal range of motion and neck supple.     Comments: rises slowly from sitting gait slow and steady  Lymphadenopathy:     Cervical: No cervical adenopathy.  Skin:     General: Skin is warm and dry.  Neurological:     Mental Status: She is alert and oriented to person, place, and time.     Deep Tendon Reflexes: Reflexes are normal and symmetric.  Psychiatric:        Behavior: Behavior normal.        Thought Content: Thought content normal.        Judgment: Judgment normal.  BP 138/82   Pulse 87   Temp 97.8 F (36.6 C) (Temporal)   Resp 20   Ht $R'5\' 6"'uK$  (1.676 m)   Wt 172 lb (78 kg)   SpO2 91%   BMI 27.76 kg/m        Assessment & Plan:   Wendy Arnold comes in today with chief complaint of Medical Management of Chronic Issues   Diagnosis and orders addressed:  1. Essential hypertension Low sodium diet - CBC with Differential/Platelet - CMP14+EGFR  2. Coronary artery disease due to lipid rich plaque Needs to follow up with cardiology  3. Simple chronic bronchitis (HCC) Smoking cessation encouraged  4. Chronic midline low back pain with left-sided sciatica Keep folow up with pain management  5. Mixed hyperlipidemia Low fat diet - Lipid panel  6. Primary insomnia Bedtime routine  7. GAD (generalized anxiety disorder) Stress management  8. Nicotine dependence, cigarettes, uncomplicated Smoking cessation encouraged   Labs pending Health Maintenance reviewed Diet and exercise encouraged  Follow up plan: 6 months   Mary-Margaret Hassell Done, FNP

## 2021-03-30 NOTE — Addendum Note (Signed)
Addended by: Cleda Daub on: 03/30/2021 04:31 PM   Modules accepted: Orders

## 2021-03-30 NOTE — Patient Instructions (Signed)

## 2021-03-31 LAB — CBC WITH DIFFERENTIAL/PLATELET
Basophils Absolute: 0.1 10*3/uL (ref 0.0–0.2)
Basos: 1 %
EOS (ABSOLUTE): 0.2 10*3/uL (ref 0.0–0.4)
Eos: 2 %
Hematocrit: 38.4 % (ref 34.0–46.6)
Hemoglobin: 13.2 g/dL (ref 11.1–15.9)
Immature Grans (Abs): 0 10*3/uL (ref 0.0–0.1)
Immature Granulocytes: 0 %
Lymphocytes Absolute: 2.6 10*3/uL (ref 0.7–3.1)
Lymphs: 37 %
MCH: 30.8 pg (ref 26.6–33.0)
MCHC: 34.4 g/dL (ref 31.5–35.7)
MCV: 90 fL (ref 79–97)
Monocytes Absolute: 0.7 10*3/uL (ref 0.1–0.9)
Monocytes: 10 %
Neutrophils Absolute: 3.4 10*3/uL (ref 1.4–7.0)
Neutrophils: 50 %
Platelets: 312 10*3/uL (ref 150–450)
RBC: 4.29 x10E6/uL (ref 3.77–5.28)
RDW: 12.6 % (ref 11.7–15.4)
WBC: 7 10*3/uL (ref 3.4–10.8)

## 2021-03-31 LAB — CMP14+EGFR
ALT: 6 IU/L (ref 0–32)
AST: 14 IU/L (ref 0–40)
Albumin/Globulin Ratio: 1.8 (ref 1.2–2.2)
Albumin: 4.5 g/dL (ref 3.8–4.8)
Alkaline Phosphatase: 82 IU/L (ref 44–121)
BUN/Creatinine Ratio: 11 — ABNORMAL LOW (ref 12–28)
BUN: 14 mg/dL (ref 8–27)
Bilirubin Total: 0.2 mg/dL (ref 0.0–1.2)
CO2: 24 mmol/L (ref 20–29)
Calcium: 9.7 mg/dL (ref 8.7–10.3)
Chloride: 103 mmol/L (ref 96–106)
Creatinine, Ser: 1.26 mg/dL — ABNORMAL HIGH (ref 0.57–1.00)
Globulin, Total: 2.5 g/dL (ref 1.5–4.5)
Glucose: 89 mg/dL (ref 70–99)
Potassium: 4.8 mmol/L (ref 3.5–5.2)
Sodium: 140 mmol/L (ref 134–144)
Total Protein: 7 g/dL (ref 6.0–8.5)
eGFR: 47 mL/min/{1.73_m2} — ABNORMAL LOW (ref >59)

## 2021-03-31 LAB — LIPID PANEL
Chol/HDL Ratio: 5.5 ratio — ABNORMAL HIGH (ref 0.0–4.4)
Cholesterol, Total: 214 mg/dL — ABNORMAL HIGH (ref 100–199)
HDL: 39 mg/dL — ABNORMAL LOW (ref >39)
LDL Chol Calc (NIH): 140 mg/dL — ABNORMAL HIGH (ref 0–99)
Triglycerides: 194 mg/dL — ABNORMAL HIGH (ref 0–149)
VLDL Cholesterol Cal: 35 mg/dL (ref 5–40)

## 2021-03-31 MED ORDER — EZETIMIBE 10 MG PO TABS
10.0000 mg | ORAL_TABLET | Freq: Every day | ORAL | 3 refills | Status: DC
Start: 2021-03-31 — End: 2023-09-01

## 2021-03-31 NOTE — Addendum Note (Signed)
Addended by: Bennie Pierini on: 03/31/2021 01:47 PM   Modules accepted: Orders

## 2021-04-14 ENCOUNTER — Other Ambulatory Visit: Payer: Self-pay | Admitting: Nurse Practitioner

## 2021-04-14 DIAGNOSIS — M545 Low back pain, unspecified: Secondary | ICD-10-CM

## 2021-04-14 DIAGNOSIS — G8929 Other chronic pain: Secondary | ICD-10-CM

## 2021-04-28 ENCOUNTER — Telehealth: Payer: Self-pay | Admitting: Physical Medicine and Rehabilitation

## 2021-04-28 MED ORDER — LEVETIRACETAM 250 MG PO TABS: 250.0000 mg | ORAL_TABLET | Freq: Two times a day (BID) | ORAL | 5 refills | Status: DC

## 2021-04-28 NOTE — Telephone Encounter (Signed)
Patient called and states MD wanted her to return call to office in 1 month - no reason - [phone]

## 2021-04-28 NOTE — Telephone Encounter (Signed)
Duloxetine hasn't helped much for pain-  Has had 4-5 episodes where hurts so much.  Had an one for pain Rx for pain meds and uses that when need be.   Trying CBD oil- as well.   Taking Flexeril- for 3-4 days- then takes a few days off.  It makes her sleep- which she also needs.   Plan: Duloxetine 30 mg daily x 3 days, then stop  2. Keppra-Levicetracem-  250 mg BID- x 1 week, for nerve pain- then 500 mg BID.    3. Still take Gabapentin and Flexeril.   4. See you back in ~ 1 month.

## 2021-04-28 NOTE — Addendum Note (Signed)
Addended by: Genice Rouge on: 04/28/2021 10:32 AM   Modules accepted: Orders

## 2021-05-18 ENCOUNTER — Other Ambulatory Visit: Payer: Self-pay

## 2021-05-18 ENCOUNTER — Emergency Department (HOSPITAL_BASED_OUTPATIENT_CLINIC_OR_DEPARTMENT_OTHER)
Admission: EM | Admit: 2021-05-18 | Discharge: 2021-05-18 | Disposition: A | Discharge status: Home / Self Care | Payer: Medicare HMO | Attending: Emergency Medicine | Admitting: Emergency Medicine

## 2021-05-18 ENCOUNTER — Encounter (HOSPITAL_BASED_OUTPATIENT_CLINIC_OR_DEPARTMENT_OTHER): Payer: Self-pay | Admitting: *Deleted

## 2021-05-18 ENCOUNTER — Emergency Department (HOSPITAL_BASED_OUTPATIENT_CLINIC_OR_DEPARTMENT_OTHER): Payer: Medicare HMO | Admitting: Radiology

## 2021-05-18 DIAGNOSIS — I10 Essential (primary) hypertension: Secondary | ICD-10-CM | POA: Insufficient documentation

## 2021-05-18 DIAGNOSIS — Z7982 Long term (current) use of aspirin: Secondary | ICD-10-CM | POA: Insufficient documentation

## 2021-05-18 DIAGNOSIS — Z79899 Other long term (current) drug therapy: Secondary | ICD-10-CM | POA: Diagnosis not present

## 2021-05-18 DIAGNOSIS — J069 Acute upper respiratory infection, unspecified: Secondary | ICD-10-CM

## 2021-05-18 DIAGNOSIS — I2510 Atherosclerotic heart disease of native coronary artery without angina pectoris: Secondary | ICD-10-CM | POA: Insufficient documentation

## 2021-05-18 DIAGNOSIS — Z20822 Contact with and (suspected) exposure to covid-19: Secondary | ICD-10-CM | POA: Insufficient documentation

## 2021-05-18 DIAGNOSIS — R531 Weakness: Secondary | ICD-10-CM | POA: Insufficient documentation

## 2021-05-18 DIAGNOSIS — N390 Urinary tract infection, site not specified: Secondary | ICD-10-CM | POA: Diagnosis not present

## 2021-05-18 DIAGNOSIS — J449 Chronic obstructive pulmonary disease, unspecified: Secondary | ICD-10-CM | POA: Diagnosis not present

## 2021-05-18 DIAGNOSIS — F17210 Nicotine dependence, cigarettes, uncomplicated: Secondary | ICD-10-CM | POA: Diagnosis not present

## 2021-05-18 DIAGNOSIS — R0602 Shortness of breath: Secondary | ICD-10-CM | POA: Diagnosis not present

## 2021-05-18 DIAGNOSIS — R059 Cough, unspecified: Secondary | ICD-10-CM | POA: Diagnosis not present

## 2021-05-18 DIAGNOSIS — R0981 Nasal congestion: Secondary | ICD-10-CM | POA: Diagnosis not present

## 2021-05-18 LAB — CBC
HCT: 38 % (ref 36.0–46.0)
Hemoglobin: 12.5 g/dL (ref 12.0–15.0)
MCH: 30.8 pg (ref 26.0–34.0)
MCHC: 32.9 g/dL (ref 30.0–36.0)
MCV: 93.6 fL (ref 80.0–100.0)
Platelets: 338 10*3/uL (ref 150–400)
RBC: 4.06 MIL/uL (ref 3.87–5.11)
RDW: 13.1 % (ref 11.5–15.5)
WBC: 8.8 10*3/uL (ref 4.0–10.5)
nRBC: 0 % (ref 0.0–0.2)

## 2021-05-18 LAB — URINALYSIS, ROUTINE W REFLEX MICROSCOPIC
Bilirubin Urine: NEGATIVE
Glucose, UA: NEGATIVE mg/dL
Ketones, ur: NEGATIVE mg/dL
Nitrite: POSITIVE — AB
Protein, ur: NEGATIVE mg/dL
Specific Gravity, Urine: 1.008 (ref 1.005–1.030)
pH: 6 (ref 5.0–8.0)

## 2021-05-18 LAB — BASIC METABOLIC PANEL
Anion gap: 8 (ref 5–15)
BUN: 13 mg/dL (ref 8–23)
CO2: 28 mmol/L (ref 22–32)
Calcium: 9.2 mg/dL (ref 8.9–10.3)
Chloride: 101 mmol/L (ref 98–111)
Creatinine, Ser: 0.95 mg/dL (ref 0.44–1.00)
GFR, Estimated: >60 mL/min (ref >60)
Glucose, Bld: 91 mg/dL (ref 70–99)
Potassium: 4.1 mmol/L (ref 3.5–5.1)
Sodium: 137 mmol/L (ref 135–145)

## 2021-05-18 LAB — RESP PANEL BY RT-PCR (FLU A&B, COVID) ARPGX2
Influenza A by PCR: NEGATIVE
Influenza B by PCR: NEGATIVE
SARS Coronavirus 2 by RT PCR: NEGATIVE

## 2021-05-18 MED ORDER — SODIUM CHLORIDE 0.9 % IV SOLN
1.0000 g | Freq: Once | INTRAVENOUS | Status: AC
  Administered 2021-05-18: 16:00:00 1 g via INTRAVENOUS
  Filled 2021-05-18: qty 10

## 2021-05-18 MED ORDER — CEFDINIR 300 MG PO CAPS: 300.0000 mg | ORAL_CAPSULE | Freq: Two times a day (BID) | ORAL | 0 refills | Status: DC

## 2021-05-18 MED ORDER — AZITHROMYCIN 250 MG PO TABS
500.0000 mg | ORAL_TABLET | Freq: Once | ORAL | Status: AC
  Administered 2021-05-18: 16:00:00 500 mg via ORAL
  Filled 2021-05-18: qty 2

## 2021-05-18 MED ORDER — ALBUTEROL SULFATE (2.5 MG/3ML) 0.083% IN NEBU
5.0000 mg | INHALATION_SOLUTION | Freq: Once | RESPIRATORY_TRACT | Status: AC
  Administered 2021-05-18: 16:00:00 5 mg via RESPIRATORY_TRACT
  Filled 2021-05-18: qty 6

## 2021-05-18 MED ORDER — GUAIFENESIN-CODEINE 100-10 MG/5ML PO SOLN
10.0000 mL | Freq: Once | ORAL | Status: AC
  Administered 2021-05-18: 16:00:00 10 mL via ORAL
  Filled 2021-05-18: qty 10

## 2021-05-18 MED ORDER — AZITHROMYCIN 250 MG PO TABS: ORAL_TABLET | ORAL | 0 refills | Status: DC

## 2021-05-18 NOTE — ED Notes (Signed)
Pt made aware that urine specimen is needed. 

## 2021-05-18 NOTE — ED Provider Notes (Signed)
[facility] EMERGENCY DEPT Provider Note   CSN: [id number] Arrival date & time: 05/18/21  1236     History Chief Complaint  Patient presents with   Weakness   Cough   Shortness of Breath    Wendy Arnold is a 70 y.o. female.  Pt c/o cough, congestion, subj fever, body aches, in the past 5 days. Symptoms acute onset, moderate, persistent. No specific known ill contacts, or known flu or covid exposure. No chest pain or discomfort. No sob. No abd pain or nvd. No trouble breathing or swallowing. No severe headaches. No dysuria or gu c/o. No skin lesions/rash. No hx asthma.   The history is provided by the patient and medical records.  Weakness Associated symptoms: cough, fever, myalgias and shortness of breath   Associated symptoms: no abdominal pain, no chest pain, no diarrhea, no dysuria, no headaches and no vomiting   Cough Associated symptoms: fever, myalgias, rhinorrhea and shortness of breath   Associated symptoms: no chest pain, no eye discharge, no headaches and no rash   Shortness of Breath Associated symptoms: cough and fever   Associated symptoms: no abdominal pain, no chest pain, no headaches, no rash and no vomiting       Past Medical History:  Diagnosis Date   Anxiety    Back pain    Depression    Diverticulitis    Insomnia    Migraines     Patient Active Problem List   Diagnosis Date Noted   Sciatic nerve pain 03/27/2021   Chronic midline low back pain with left-sided sciatica 09/24/2019   GAD (generalized anxiety disorder) 09/24/2019   Mixed hyperlipidemia 09/24/2019   Primary insomnia 06/25/2019   Essential hypertension 06/25/2019   Coronary artery disease due to lipid rich plaque 01/21/2016   Nicotine dependence, cigarettes, uncomplicated 01/21/2016   Chronic obstructive pulmonary disease (HCC) 02/24/2015   Alpha-1-antitrypsin deficiency carrier 07/24/2011    Past Surgical History:  Procedure Laterality Date   ABDOMINAL  HYSTERECTOMY     BACK SURGERY     SINUS ENDO WITH FUSION     TOE SURGERY       OB History     Gravida  5   Para  1   Term      Preterm  1   AB  4   Living  1      SAB      IAB      Ectopic      Multiple      Live Births              Family History  Problem Relation Age of Onset   Heart failure Mother    Cancer Father    Cancer Brother    Cancer Brother    Cancer Brother    Breast cancer Neg Hx     Social History   Tobacco Use   Smoking status: Every Day    Packs/day: 0.25    Years: 40.00    Pack years: 10.00    Types: Cigarettes   Smokeless tobacco: Never  Substance Use Topics   Alcohol use: Not Currently    Comment: rarely   Drug use: No    Home Medications Prior to Admission medications   Medication Sig Start Date End Date Taking? Authorizing Provider  ALPRAZolam Prudy Feeler) 0.5 MG tablet Take 1 tablet (0.5 mg total) by mouth 2 (two) times daily as needed for anxiety. 12/19/20   Bennie Pierini, FNP  amLODipine-benazepril (LOTREL)  10-20 MG capsule Take 1 capsule by mouth daily. 12/19/20   Daphine Deutscher Mary-Margaret, FNP  aspirin EC 81 MG tablet Take 81 mg by mouth at bedtime.     [provider]  bacitracin-polymyxin b (POLYSPORIN) ophthalmic ointment Place 1 application into both eyes 2 (two) times daily. apply to eye every 12 hours while awake 07/23/20   Daryll Drown, NP  chlorhexidine (PERIDEX) 0.12 % solution SMARTSIG:0.5 Ounce(s) By Mouth Twice Daily 04/25/20   [provider]  cyclobenzaprine (FLEXERIL) 10 MG tablet Take 1 tablet (10 mg total) by mouth 2 (two) times daily as needed for muscle spasms. 03/27/21   Lovorn, Aundra Millet, MD  DULoxetine (CYMBALTA) 30 MG capsule Take 1 capsule (30 mg total) by mouth daily. X 1 week, then 60 mg daily- for nerve and back pain-take with food. 03/27/21   Lovorn, Aundra Millet, MD  ezetimibe (ZETIA) 10 MG tablet Take 1 tablet (10 mg total) by mouth daily. 03/31/21   Daphine Deutscher Mary-Margaret, FNP   gabapentin (NEURONTIN) 300 MG capsule TAKE 1 CAPSULE THREE TIMES DAILY 04/14/21   Daphine Deutscher, Mary-Margaret, FNP  ibuprofen (ADVIL) 800 MG tablet Take 1 tablet (800 mg total) by mouth every 8 (eight) hours as needed. 09/17/19   Daphine Deutscher, Mary-Margaret, FNP  levETIRAcetam (KEPPRA) 250 MG tablet Take 1 tablet (250 mg total) by mouth 2 (two) times daily. X 1 week, then 500 mg 2x/day- for nerve pain 04/28/21   Lovorn, Aundra Millet, MD  Multiple Vitamins-Minerals (ALIVE WOMENS 50+ PO) Take by mouth.    [provider]  rosuvastatin (CRESTOR) 10 MG tablet Take 1 tablet (10 mg total) by mouth daily. 12/19/20   Bennie Pierini, FNP  traZODone (DESYREL) 150 MG tablet TAKE 1 TABLET AT BEDTIME. MAY TAKE 150MG  AS PRESCRIBED 03/02/21   05/02/21, NP    Allergies    Amoxicillin-pot clavulanate and Flagyl [metronidazole]  Review of Systems   Review of Systems  Constitutional:  Positive for fever.  HENT:  Positive for congestion and rhinorrhea. Negative for trouble swallowing.   Eyes:  Negative for discharge and redness.  Respiratory:  Positive for cough and shortness of breath.   Cardiovascular:  Negative for chest pain.  Gastrointestinal:  Negative for abdominal pain, diarrhea and vomiting.  Genitourinary:  Negative for dysuria and flank pain.  Musculoskeletal:  Positive for myalgias. Negative for back pain and neck stiffness.  Skin:  Negative for rash.  Neurological:  Positive for weakness. Negative for headaches.  Hematological:  Does not bruise/bleed easily.  Psychiatric/Behavioral:  Negative for confusion.    Physical Exam Updated Vital Signs BP (!) 138/117    Pulse (!) 104    Temp 98.9 F (37.2 C) (Oral)    Resp (!) 22    Ht 1.702 m (5\' 7" )    Wt 79.4 kg    SpO2 96%    BMI 27.41 kg/m   Physical Exam Vitals and nursing note reviewed.  Constitutional:      Appearance: Normal appearance. She is well-developed.  HENT:     Head: Atraumatic.     Right Ear: Tympanic membrane normal.      Left Ear: Tympanic membrane normal.     Nose: Congestion present.     Mouth/Throat:     Mouth: Mucous membranes are moist.     Pharynx: Oropharynx is clear. No oropharyngeal exudate or posterior oropharyngeal erythema.  Eyes:     General: No scleral icterus.    Conjunctiva/sclera: Conjunctivae normal.     Pupils: Pupils are equal,  round, and reactive to light.  Neck:     Trachea: No tracheal deviation.     Comments: No neck stiffness or rigidity. Spine non tender.  Cardiovascular:     Rate and Rhythm: Normal rate and regular rhythm.     Pulses: Normal pulses.     Heart sounds: Normal heart sounds. No murmur heard.   No friction rub. No gallop.  Pulmonary:     Effort: Pulmonary effort is normal. No respiratory distress.     Breath sounds: Normal breath sounds. No stridor.     Comments: Coughing, upper resp congestion.  Abdominal:     General: Bowel sounds are normal. There is no distension.     Palpations: Abdomen is soft.     Tenderness: There is no abdominal tenderness.  Genitourinary:    Comments: No cva tenderness.  Musculoskeletal:        General: No swelling or tenderness.     Cervical back: Normal range of motion and neck supple. No rigidity. No muscular tenderness.     Right lower leg: No edema.     Left lower leg: No edema.  Lymphadenopathy:     Cervical: No cervical adenopathy.  Skin:    General: Skin is warm and dry.     Findings: No rash.  Neurological:     Mental Status: She is alert.     Comments: Alert, speech normal.   Psychiatric:        Mood and Affect: Mood normal.    ED Results / Procedures / Treatments   Labs (all labs ordered are listed, but only abnormal results are displayed) Results for orders placed or performed during the hospital encounter of 05/18/21  Resp Panel by RT-PCR (Flu A&B, Covid) Nasopharyngeal Swab   Specimen: Nasopharyngeal Swab; Nasopharyngeal(NP) swabs in vial transport medium  Result Value Ref Range   SARS Coronavirus 2 by  RT PCR NEGATIVE NEGATIVE   Influenza A by PCR NEGATIVE NEGATIVE   Influenza B by PCR NEGATIVE NEGATIVE  Basic metabolic panel  Result Value Ref Range   Sodium 137 135 - 145 mmol/L   Potassium 4.1 3.5 - 5.1 mmol/L   Chloride 101 98 - 111 mmol/L   CO2 28 22 - 32 mmol/L   Glucose, Bld 91 70 - 99 mg/dL   BUN 13 8 - 23 mg/dL   Creatinine, Ser 5.27 0.44 - 1.00 mg/dL   Calcium 9.2 8.9 - 78.2 mg/dL   GFR, Estimated >42 >35 mL/min   Anion gap 8 5 - 15  CBC  Result Value Ref Range   WBC 8.8 4.0 - 10.5 K/uL   RBC 4.06 3.87 - 5.11 MIL/uL   Hemoglobin 12.5 12.0 - 15.0 g/dL   HCT 36.1 44.3 - 15.4 %   MCV 93.6 80.0 - 100.0 fL   MCH 30.8 26.0 - 34.0 pg   MCHC 32.9 30.0 - 36.0 g/dL   RDW 00.8 67.6 - 19.5 %   Platelets 338 150 - 400 K/uL   nRBC 0.0 0.0 - 0.2 %  Urinalysis, Routine w reflex microscopic  Result Value Ref Range   Color, Urine YELLOW YELLOW   APPearance HAZY (A) CLEAR   Specific Gravity, Urine 1.008 1.005 - 1.030   pH 6.0 5.0 - 8.0   Glucose, UA NEGATIVE NEGATIVE mg/dL   Hgb urine dipstick TRACE (A) NEGATIVE   Bilirubin Urine NEGATIVE NEGATIVE   Ketones, ur NEGATIVE NEGATIVE mg/dL   Protein, ur NEGATIVE NEGATIVE mg/dL   Nitrite POSITIVE (A)  NEGATIVE   Leukocytes,Ua LARGE (A) NEGATIVE   RBC / HPF 0-5 0 - 5 RBC/hpf   WBC, UA 21-50 0 - 5 WBC/hpf   Bacteria, UA MANY (A) NONE SEEN   Squamous Epithelial / LPF 0-5 0 - 5   WBC Clumps PRESENT    Mucus PRESENT    DG Chest 2 View  Result Date: 05/18/2021 CLINICAL DATA:  Shortness of breath EXAM: CHEST - 2 VIEW COMPARISON:  09/25/2011 FINDINGS: The heart size and mediastinal contours are within normal limits. Aortic atherosclerosis. Minimal streaky left basilar opacity. Lungs appear otherwise clear. No pleural effusion or pneumothorax. The visualized skeletal structures are unremarkable. IMPRESSION: Minimal streaky left basilar opacity, likely atelectasis. Lungs appear otherwise clear. Electronically Signed   By: Duanne Guess  D.O.   On: 05/18/2021 13:35    EKG EKG Interpretation  Date/Time:  Monday May 18 2021 13:15:30 EST Ventricular Rate:  89 PR Interval:  178 QRS Duration: 88 QT Interval:  344 QTC Calculation: 418 R Axis:   39 Text Interpretation: Normal sinus rhythm No significant change since last tracing Confirmed by Cathren Laine ([id number]) on 05/18/2021 2:55:05 PM  Radiology DG Chest 2 View  Result Date: 05/18/2021 CLINICAL DATA:  Shortness of breath EXAM: CHEST - 2 VIEW COMPARISON:  09/25/2011 FINDINGS: The heart size and mediastinal contours are within normal limits. Aortic atherosclerosis. Minimal streaky left basilar opacity. Lungs appear otherwise clear. No pleural effusion or pneumothorax. The visualized skeletal structures are unremarkable. IMPRESSION: Minimal streaky left basilar opacity, likely atelectasis. Lungs appear otherwise clear. Electronically Signed   By: Duanne Guess D.O.   On: 05/18/2021 13:35    Procedures Procedures   Medications Ordered in ED Medications  cefTRIAXone (ROCEPHIN) 1 g in sodium chloride 0.9 % 100 mL IVPB (1 g Intravenous New Bag/Given 05/18/21 1538)  azithromycin (ZITHROMAX) tablet 500 mg (500 mg Oral Given 05/18/21 1534)  albuterol (PROVENTIL) (2.5 MG/3ML) 0.083% nebulizer solution 5 mg (5 mg Nebulization Given 05/18/21 1535)  guaiFENesin-codeine 100-10 MG/5ML solution 10 mL (10 mLs Oral Given 05/18/21 1534)    ED Course  I have reviewed the triage vital signs and the nursing notes.  Pertinent labs & imaging results that were available during my care of the patient were reviewed by me and considered in my medical decision making (see chart for details).    MDM Rules/Calculators/A&P                         Labs and imaging ordered.   Reviewed nursing notes and prior charts for additional history.   Labs reviewed/interpreted by me - chem normal. Wbc normal. UA w possible uti.  Rocephin iv. Zithromax.   CXR reviewed/interpreted by me -  atelectasis, no def pna.   Albuterol tx.   Recheck pt, breathing comfortably, discussed labs.   Pt appears stable for d/c.   Return precautions provided.      Final Clinical Impression(s) / ED Diagnoses Final diagnoses:  None    Rx / DC Orders ED Discharge Orders     None        Cathren Laine, MD 05/18/21 1547

## 2021-05-18 NOTE — Discharge Instructions (Addendum)
It was our pleasure to provide your ER care today - we hope that you feel better.  Drink plenty of fluids/stay well hydrated.  Your covid and flu tests are negative. Your urine tests show a possible urine infection.   May take over the counter cough/cold medication as need for symptom relief.   Take antibiotics as prescribed.   Follow up with primary care doctor in one week if symptoms fail to improve/resolve.  Return to ER if worse, new symptoms, increased trouble breathing, new/severe pain, severe abdominal pain, persistent vomiting, weak/fainting, or other concern.

## 2021-05-18 NOTE — ED Triage Notes (Addendum)
Last Wednesday pt developed a cough, yellow mucus, shob and weakness. Taking Ibuprofen and Tylenol with OTC meds. C/o neck and chest pain worse with cough as well as general body aches

## 2021-05-20 ENCOUNTER — Other Ambulatory Visit: Payer: Self-pay | Admitting: Physical Medicine and Rehabilitation

## 2021-06-02 ENCOUNTER — Telehealth: Payer: Self-pay | Admitting: *Deleted

## 2021-06-02 NOTE — Telephone Encounter (Signed)
Ms Wendy Arnold called and reports she is in so much pain she "cannot stand it". She says she usually can get some oxycontin from someone that gets it from a pharmacy--she almost said the street, but stopped herself and was crying in pain. I have told her that Dr Berline Chough is out of the office for the week and there is nothing we can do for her since we have not been prescribing medication for her. She will need to see her PCP or go to the emergency room. I will send message to Dr Dahlia Client inbox but she will not see until next week.

## 2021-06-04 ENCOUNTER — Encounter: Payer: Self-pay | Admitting: Nurse Practitioner

## 2021-06-04 ENCOUNTER — Ambulatory Visit (INDEPENDENT_AMBULATORY_CARE_PROVIDER_SITE_OTHER): Payer: Medicare HMO | Admitting: Nurse Practitioner

## 2021-06-04 DIAGNOSIS — G8929 Other chronic pain: Secondary | ICD-10-CM

## 2021-06-04 DIAGNOSIS — M545 Low back pain, unspecified: Secondary | ICD-10-CM | POA: Diagnosis not present

## 2021-06-04 MED ORDER — PREDNISONE 10 MG (21) PO TBPK: ORAL_TABLET | ORAL | 0 refills | Status: DC

## 2021-06-04 NOTE — Patient Instructions (Signed)
Chronic Back Pain When back pain lasts longer than 3 months, it is called chronic back pain. Pain may get worse at certain times (flare-ups). There are things you can do at home to manage your pain. Follow these instructions at home: Pay attention to any changes in your symptoms. Take these actions to help with your pain: Managing pain and stiffness   If told, put ice on the painful area. Your doctor may tell you to use ice for 24-48 hours after the flare-up starts. To do this: Put ice in a plastic bag. Place a towel between your skin and the bag. Leave the ice on for 20 minutes, 2-3 times a day. If told, put heat on the painful area. Do this as often as told by your doctor. Use the heat source that your doctor recommends, such as a moist heat pack or a heating pad. Place a towel between your skin and the heat source. Leave the heat on for 20-30 minutes. Take off the heat if your skin turns bright red. This is especially important if you are unable to feel pain, heat, or cold. You may have a greater risk of getting burned. Soak in a warm bath. This can help relieve pain. Activity  Avoid bending and other activities that make pain worse. When standing: Keep your upper back and neck straight. Keep your shoulders pulled back. Avoid slouching. When sitting: Keep your back straight. Relax your shoulders. Do not round your shoulders or pull them backward. Do not sit or stand in one place for long periods of time. Take short rest breaks during the day. Lying down or standing is usually better than sitting. Resting can help relieve pain. When sitting or lying down for a long time, do some mild activity or stretching. This will help to prevent stiffness and pain. Get regular exercise. Ask your doctor what activities are safe for you. Do not lift anything that is heavier than 10 lb (4.5 kg) or the limit that you are told, until your doctor says that it is safe. To prevent injury when you lift  things: Bend your knees. Keep the weight close to your body. Avoid twisting. Sleep on a firm mattress. Try lying on your side with your knees slightly bent. If you lie on your back, put a pillow under your knees. Medicines Treatment may include medicines for pain and swelling taken by mouth or put on the skin, prescription pain medicine, or muscle relaxants. Take over-the-counter and prescription medicines only as told by your doctor. Ask your doctor if the medicine prescribed to you: Requires you to avoid driving or using machinery. Can cause trouble pooping (constipation). You may need to take these actions to prevent or treat trouble pooping: Drink enough fluid to keep your pee (urine) pale yellow. Take over-the-counter or prescription medicines. Eat foods that are high in fiber. These include beans, whole grains, and fresh fruits and vegetables. Limit foods that are high in fat and sugars. These include fried or sweet foods. General instructions Do not use any products that contain nicotine or tobacco, such as cigarettes, e-cigarettes, and chewing tobacco. If you need help quitting, ask your doctor. Keep all follow-up visits as told by your doctor. This is important. Contact a doctor if: Your pain does not get better with rest or medicine. Your pain gets worse, or you have new pain. You have a high fever. You lose weight very quickly. You have trouble doing your normal activities. Get help right away if: One   or both of your legs or feet feel weak. One or both of your legs or feet lose feeling (have numbness). You have trouble controlling when you poop (have a bowel movement) or pee (urinate). You have bad back pain and: You feel like you may vomit (nauseous), or you vomit. You have pain in your belly (abdomen). You have shortness of breath. You faint. Summary When back pain lasts longer than 3 months, it is called chronic back pain. Pain may get worse at certain times  (flare-ups). Use ice and heat as told by your doctor. Your doctor may tell you to use ice after flare-ups. This information is not intended to replace advice given to you by your health care provider. Make sure you discuss any questions you have with your health care provider. Document Revised: 06/20/2019 Document Reviewed: 06/20/2019 Elsevier Patient Education  2022 Elsevier Inc[employer]  

## 2021-06-04 NOTE — Progress Notes (Signed)
Virtual Visit  Note Due to COVID-19 pandemic this visit was conducted virtually. This visit type was conducted due to national recommendations for restrictions regarding the COVID-19 Pandemic (e.g. social distancing, sheltering in place) in an effort to limit this patient's exposure and mitigate transmission in our community. All issues noted in this document were discussed and addressed.  A physical exam was not performed with this format.  I connected with Wendy Arnold on 06/04/21 at 3:00 by telephone and verified that I am seaking with the correct person using two identifiers. Wendy Arnold is currently located at  home and her brother is with  is currently with her during visit. The provider, Mary-Margaret Hassell Done, FNP is located in their office at time of visit.  I discussed the limitations, risks, security and privacy concerns of performing an evaluation and management service by telephone and the availability of in person appointments. I also discussed with the patient that there may be a patient responsible charge related to this service. The patient expressed understanding and agreed to proceed.   History and Present Illness:  Patient crying while on phone. She does see a specialist for her chronic back pain but they would not see her today.  Back Pain This is a recurrent problem. The current episode started in the past 7 days. The problem occurs constantly. The problem has been gradually worsening since onset. The pain is present in the sacro-iliac. The quality of the pain is described as shooting and stabbing. Radiates to: radiate around to goin area. The pain is at a severity of 9/10. The pain is severe. The pain is The same all the time. The symptoms are aggravated by bending, lying down, sitting, standing and twisting. Stiffness is present All day. She has tried NSAIDs for the symptoms. The treatment provided mild relief.      Review of Systems  Musculoskeletal:  Positive  for back pain.  All other systems reviewed and are negative.   Observations/Objective: Alert and oriented- answers all questions appropriately No distress Tearful during visit  Assessment and Plan: Wendy Arnold in today with chief complaint of No chief complaint on file.   1. Chronic midline low back pain without sciatica Moist heat Rest Explained to patient that office policy cannot give controlled meds in telephone visit. I di offer ultram but patient refused. Said she would just call EMS and go to the ED  Meds ordered this encounter  Medications   predniSONE (STERAPRED UNI-PAK 21 TAB) 10 MG (21) TBPK tablet    Sig: As directed x 6 days    Dispense:  21 tablet    Refill:  0    Order Specific Question:   Supervising Provider    Answer:   Caryl Pina A [id number]    Follow Up Instructions: prn    I discussed the assessment and treatment plan with the patient. The patient was provided an opportunity to ask questions and all were answered. The patient agreed with the plan and demonstrated an understanding of the instructions.   The patient was advised to call back or seek an in-person evaluation if the symptoms worsen or if the condition fails to improve as anticipated.  The above assessment and management plan was discussed with the patient. The patient verbalized understanding of and has agreed to the management plan. Patient is aware to call the clinic if symptoms persist or worsen. Patient is aware when to return to the clinic for a follow-up visit. Patient educated  on when it is appropriate to go to the emergency department.   Time call ended:  3:13   I provided 13 minutes of  non face-to-face time during this encounter.    Mary-Margaret Hassell Done, FNP

## 2021-06-05 ENCOUNTER — Encounter: Payer: Self-pay | Admitting: Nurse Practitioner

## 2021-06-05 ENCOUNTER — Ambulatory Visit (INDEPENDENT_AMBULATORY_CARE_PROVIDER_SITE_OTHER): Payer: Medicare HMO | Admitting: Nurse Practitioner

## 2021-06-05 VITALS — BP 128/82 | HR 91 | Temp 97.8°F | Resp 20 | Ht 67.0 in | Wt 175.0 lb

## 2021-06-05 DIAGNOSIS — M5442 Lumbago with sciatica, left side: Secondary | ICD-10-CM

## 2021-06-05 DIAGNOSIS — G8929 Other chronic pain: Secondary | ICD-10-CM

## 2021-06-05 MED ORDER — METHYLPREDNISOLONE ACETATE 40 MG/ML IJ SUSP
80.0000 mg | Freq: Once | INTRAMUSCULAR | Status: AC
  Administered 2021-06-05: 80 mg via INTRAMUSCULAR

## 2021-06-05 MED ORDER — METHYLPREDNISOLONE ACETATE 80 MG/ML IJ SUSP: 80.0000 mg | Freq: Once | INTRAMUSCULAR | Status: DC

## 2021-06-05 MED ORDER — KETOROLAC TROMETHAMINE 60 MG/2ML IM SOLN
60.0000 mg | Freq: Once | INTRAMUSCULAR | Status: AC
  Administered 2021-06-05: 60 mg via INTRAMUSCULAR

## 2021-06-05 MED ORDER — HYDROCODONE-ACETAMINOPHEN 5-325 MG PO TABS: 1.0000 | ORAL_TABLET | Freq: Four times a day (QID) | ORAL | 0 refills | Status: DC | PRN

## 2021-06-05 NOTE — Patient Instructions (Signed)

## 2021-06-05 NOTE — Progress Notes (Signed)
Subjective:    Patient ID: Wendy Arnold, female    DOB: 1950/10/02, 71 y.o.   MRN: [id number]   Chief Complaint: back pain  Back Pain This is a recurrent problem. The current episode started in the past 7 days. The problem occurs constantly. The problem has been gradually worsening since onset. The pain is present in the lumbar spine. The quality of the pain is described as shooting and stabbing. Radiates to: radiates around to perineal area. The pain is at a severity of 9/10. The pain is severe. The pain is The same all the time. The symptoms are aggravated by bending, sitting, standing and lying down. Stiffness is present All day. She did a telphone visit yesterday and was told we could not do pan medication in telephone visit. She called ems but she did not go to the ED. Her pain is severe and sheis not able to take care of her handicap brother due to the pain.     Review of Systems  Musculoskeletal:  Positive for back pain.      Objective:   Physical Exam Vitals and nursing note reviewed.  Constitutional:      General: She is not in acute distress.    Appearance: Normal appearance. She is well-developed.  Neck:     Vascular: No carotid bruit or JVD.  Cardiovascular:     Rate and Rhythm: Normal rate and regular rhythm.     Heart sounds: Normal heart sounds.  Pulmonary:     Effort: Pulmonary effort is normal. No respiratory distress.     Breath sounds: Normal breath sounds. No wheezing or rales.  Chest:     Chest wall: No tenderness.  Abdominal:     General: Bowel sounds are normal. There is no distension or abdominal bruit.     Palpations: Abdomen is soft. There is no hepatomegaly, splenomegaly, mass or pulsatile mass.     Tenderness: There is no abdominal tenderness.  Musculoskeletal:        General: Normal range of motion.     Cervical back: Normal range of motion and neck supple.     Comments: Patient pacing on room. Cannot seat down due to pain (+) slr on  left Motor strength and sensation distally intact  Lymphadenopathy:     Cervical: No cervical adenopathy.  Skin:    General: Skin is warm and dry.  Neurological:     Mental Status: She is alert and oriented to person, place, and time.     Deep Tendon Reflexes: Reflexes are normal and symmetric.  Psychiatric:        Behavior: Behavior normal.        Thought Content: Thought content normal.        Judgment: Judgment normal.   .BP 128/82    Pulse 91    Temp 97.8 F (36.6 C) (Temporal)    Resp 20    Ht 5\' 7"  (1.702 m)    Wt 175 lb (79.4 kg)    SpO2 97%    BMI 27.41 kg/m         Assessment & Plan:  in today with chief complaint of Back Pain (Radiating around to pelvis)   1. Chronic midline low back pain with left-sided sciatica Ice bid Rest No lifting bending or stooping Rto prn - ketorolac (TORADOL) injection 60 mg - HYDROcodone-acetaminophen (NORCO) 5-325 MG tablet; Take 1 tablet by mouth every 6 (six) hours as needed for moderate pain.  Dispense:  30 tablet; Refill: 0 - methylPREDNISolone acetate (DEPO-MEDROL) injection 80 mg    The above assessment and management plan was discussed with the patient. The patient verbalized understanding of and has agreed to the management plan. Patient is aware to call the clinic if symptoms persist or worsen. Patient is aware when to return to the clinic for a follow-up visit. Patient educated on when it is appropriate to go to the emergency department.   Mary-Margaret Daphine Deutscher, FNP

## 2021-06-12 ENCOUNTER — Telehealth: Payer: Self-pay | Admitting: *Deleted

## 2021-06-12 NOTE — Telephone Encounter (Signed)
Ms Rehling called and says that it is imperative that she speak with Dr Dagoberto Ligas before her appt (1/27/). Please call her at [phone]

## 2021-06-12 NOTE — Telephone Encounter (Signed)
Patient called and said that she needs to speak with Dr. Dagoberto Ligas.

## 2021-06-19 ENCOUNTER — Encounter: Payer: Medicare HMO | Admitting: Physical Medicine and Rehabilitation

## 2021-07-07 ENCOUNTER — Other Ambulatory Visit: Payer: Self-pay | Admitting: Nurse Practitioner

## 2021-07-07 DIAGNOSIS — I10 Essential (primary) hypertension: Secondary | ICD-10-CM

## 2021-07-14 ENCOUNTER — Telehealth: Payer: Self-pay | Admitting: Nurse Practitioner

## 2021-07-14 ENCOUNTER — Other Ambulatory Visit: Payer: Self-pay | Admitting: Nurse Practitioner

## 2021-07-14 DIAGNOSIS — F411 Generalized anxiety disorder: Secondary | ICD-10-CM

## 2021-07-14 MED ORDER — ALPRAZOLAM 0.5 MG PO TABS: 0.5000 mg | ORAL_TABLET | Freq: Two times a day (BID) | ORAL | 0 refills | Status: DC | PRN

## 2021-07-14 NOTE — Telephone Encounter (Signed)
Pt is aware.  

## 2021-07-14 NOTE — Telephone Encounter (Signed)
Left message to call back  

## 2021-07-14 NOTE — Telephone Encounter (Signed)
Prescription sent to pharmacy.

## 2021-07-14 NOTE — Telephone Encounter (Signed)
Patient was seen for regular office visit on 03/30/21, had two visits in January for back pain.  Runs out of Xanax tonight.  Was told at appointment in November to come back in 6 months, has appointment in May.  I scheduled her for an appointment on 07/27/21 with you but she wants to know if you will send in one month supply to get her to appointment.  Please advise.

## 2021-07-14 NOTE — Telephone Encounter (Signed)
°  Prescription Request  07/14/2021  Is this a "Controlled Substance" medicine? yes  Have you seen your PCP in the last 2 weeks? no  If YES, route message to pool  -  If NO, patient needs to be scheduled for appointment.   *told patient that she needed an appt and she wanted a message put in first*  What is the name of the medication or equipment? ALPRAZolam (XANAX) 0.5 MG tablet  Have you contacted your pharmacy to request a refill? Yes    Which pharmacy would you like this sent to? [facility] [place], [place] - [address] (Ph: [phone])   Patient notified that their request is being sent to the clinical staff for review and that they should receive a response within 2 business days.

## 2021-07-27 ENCOUNTER — Encounter: Payer: Self-pay | Admitting: Nurse Practitioner

## 2021-07-27 ENCOUNTER — Ambulatory Visit (INDEPENDENT_AMBULATORY_CARE_PROVIDER_SITE_OTHER): Payer: Medicare HMO | Admitting: Nurse Practitioner

## 2021-07-27 VITALS — BP 147/89 | HR 95 | Temp 97.2°F | Resp 20 | Ht 67.0 in | Wt 172.0 lb

## 2021-07-27 DIAGNOSIS — G8929 Other chronic pain: Secondary | ICD-10-CM

## 2021-07-27 DIAGNOSIS — J410 Simple chronic bronchitis: Secondary | ICD-10-CM

## 2021-07-27 DIAGNOSIS — M545 Low back pain, unspecified: Secondary | ICD-10-CM

## 2021-07-27 DIAGNOSIS — F411 Generalized anxiety disorder: Secondary | ICD-10-CM | POA: Diagnosis not present

## 2021-07-27 DIAGNOSIS — E782 Mixed hyperlipidemia: Secondary | ICD-10-CM | POA: Diagnosis not present

## 2021-07-27 DIAGNOSIS — M5442 Lumbago with sciatica, left side: Secondary | ICD-10-CM | POA: Diagnosis not present

## 2021-07-27 DIAGNOSIS — I2510 Atherosclerotic heart disease of native coronary artery without angina pectoris: Secondary | ICD-10-CM | POA: Diagnosis not present

## 2021-07-27 DIAGNOSIS — I10 Essential (primary) hypertension: Secondary | ICD-10-CM | POA: Diagnosis not present

## 2021-07-27 DIAGNOSIS — F17210 Nicotine dependence, cigarettes, uncomplicated: Secondary | ICD-10-CM

## 2021-07-27 DIAGNOSIS — I2583 Coronary atherosclerosis due to lipid rich plaque: Secondary | ICD-10-CM

## 2021-07-27 DIAGNOSIS — F5101 Primary insomnia: Secondary | ICD-10-CM | POA: Diagnosis not present

## 2021-07-27 MED ORDER — GABAPENTIN 300 MG PO CAPS: ORAL_CAPSULE | ORAL | 1 refills | Status: DC

## 2021-07-27 MED ORDER — ALPRAZOLAM 0.5 MG PO TABS: 0.5000 mg | ORAL_TABLET | Freq: Two times a day (BID) | ORAL | 5 refills | Status: DC | PRN

## 2021-07-27 MED ORDER — AMLODIPINE BESY-BENAZEPRIL HCL 10-20 MG PO CAPS: 1.0000 | ORAL_CAPSULE | Freq: Every day | ORAL | 1 refills | Status: DC

## 2021-07-27 MED ORDER — TRAZODONE HCL 150 MG PO TABS: 150.0000 mg | ORAL_TABLET | Freq: Every day | ORAL | 1 refills | Status: DC

## 2021-07-27 MED ORDER — ROSUVASTATIN CALCIUM 10 MG PO TABS: 10.0000 mg | ORAL_TABLET | Freq: Every day | ORAL | 1 refills | Status: DC

## 2021-07-27 MED ORDER — LEVETIRACETAM 500 MG PO TABS: 500.0000 mg | ORAL_TABLET | Freq: Two times a day (BID) | ORAL | 5 refills | Status: DC

## 2021-07-27 NOTE — Progress Notes (Signed)
Subjective:    Patient ID: Wendy Arnold, female    DOB: [birth year], 71 y.o.   MRN: [id number]   Chief Complaint: medical management of chronic issues     HPI:  Wendy Arnold is a 71 y.o. who identifies as a female who was assigned female at birth.   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 Essential hypertension No c/o chest pain, sob or headache. Does not check blood pressure at home. BP Readings from Last 3 Encounters:  06/05/21 128/82  05/18/21 (!) 146/95  03/30/21 138/82     2. Coronary artery disease due to lipid rich plaque Has not seen cardiology in several years.  3. Simple chronic bronchitis (HCC) Has chronic cough and soB. She os not on an inhaler and she is still smoking.  4. Mixed hyperlipidemia Doe snot watch diet and does no exercise at all Lab Results  Component Value Date   CHOL 214 (H) 03/30/2021   HDL 39 (L) 03/30/2021   LDLCALC 140 (H) 03/30/2021   TRIG 194 (H) 03/30/2021   CHOLHDL 5.5 (H) 03/30/2021     5. GAD (generalized anxiety disorder) Her brother is Handicapped and she is his care giver, which has become increasing difficult. She stays anxious all the time. She takes xanax BID. She is currently out of meds. GAD 7 : Generalized Anxiety Score 07/27/2021 12/19/2020 06/26/2020 09/24/2019  Nervous, Anxious, on Edge 2 2 3 2   Control/stop worrying 1 1 2 3   Worry too much - different things 1 1 2 3   Trouble relaxing 0 0 1 1  Restless 0 0 0 0  Easily annoyed or irritable 1 1 1 1   Afraid - awful might happen 1 1 2 2   Total GAD 7 Score 6 6 11 12   Anxiety Difficulty Somewhat difficult Somewhat difficult Somewhat difficult Not difficult at all      6. Primary insomnia Has trouble sleeping. Trazadone helps. She sleeps about 7 hours a night.  7. Nicotine dependence, cigarettes, uncomplicated Smokes over a pack  a day. Refuses low dose CT scan due to expense.  8. Chronic midline low back pain with left-sided sciatica Has had lots of back pain recently. She is on neurontin and has been given an [facility] prescription for norco in the past. She has recently seen Dr. and has been requesting pain medication from them. They have refused to give her any. They have prescribed cymbalta and flexeril which helps some. She rates her back pain as 5/10 some days and 8-9/10 other days. She has refused Physical therapy.   New complaints: None today  Allergies  Allergen Reactions   Amoxicillin-Pot Clavulanate    Flagyl [Metronidazole] Nausea And Vomiting and Other (See Comments)    Severe headaches; leg cramps.   Outpatient Encounter Medications as of 07/27/2021  Medication Sig   ALPRAZolam (XANAX) 0.5 MG tablet Take 1 tablet (0.5 mg total) by mouth 2 (two) times daily as needed for anxiety.   amLODipine-benazepril (LOTREL) 10-20 MG capsule TAKE 1 CAPSULE EVERY DAY   aspirin EC 81 MG tablet Take 81 mg by mouth at bedtime.    azithromycin (ZITHROMAX Z-PAK) 250 MG tablet Take one tablet a day for the next 4 days   bacitracin-polymyxin b (POLYSPORIN) ophthalmic ointment Place 1 application into both eyes 2 (two) times daily. apply to eye every 12  hours while awake   cefdinir (OMNICEF) 300 MG capsule Take 1 capsule (300 mg total) by mouth 2 (two) times daily.   chlorhexidine (PERIDEX) 0.12 % solution SMARTSIG:0.5 Ounce(s) By Mouth Twice Daily   cyclobenzaprine (FLEXERIL) 10 MG tablet Take 1 tablet (10 mg total) by mouth 2 (two) times daily as needed for muscle spasms.   DULoxetine (CYMBALTA) 30 MG capsule Take 1 capsule (30 mg total) by mouth daily. X 1 week, then 60 mg daily- for nerve and back pain-take with food.   ezetimibe (ZETIA) 10 MG tablet Take 1 tablet (10 mg total) by mouth daily.   gabapentin (NEURONTIN) 300 MG capsule TAKE 1 CAPSULE THREE TIMES DAILY   HYDROcodone-acetaminophen (NORCO) 5-325 MG tablet  Take 1 tablet by mouth every 6 (six) hours as needed for moderate pain.   ibuprofen (ADVIL) 800 MG tablet Take 1 tablet (800 mg total) by mouth every 8 (eight) hours as needed.   levETIRAcetam (KEPPRA) 500 MG tablet Take 1 tablet (500 mg total) by mouth 2 (two) times daily.   Multiple Vitamins-Minerals (ALIVE WOMENS 50+ PO) Take by mouth.   rosuvastatin (CRESTOR) 10 MG tablet Take 1 tablet (10 mg total) by mouth daily.   traZODone (DESYREL) 150 MG tablet TAKE 1 TABLET AT BEDTIME. MAY TAKE  AS PRESCRIBED   No facility-administered encounter medications on file as of 07/27/2021.    Past Surgical History:  Procedure Laterality Date   ABDOMINAL HYSTERECTOMY     BACK SURGERY     SINUS ENDO WITH FUSION     TOE SURGERY      Family History  Problem Relation Age of Onset   Heart failure Mother    Cancer Father    Cancer Brother    Cancer Brother    Cancer Brother    Breast cancer Neg Hx       Controlled substance contract: 04/02/21     Review of Systems  Constitutional:  Negative for diaphoresis.  Eyes:  Negative for pain.  Respiratory:  Negative for shortness of breath.   Cardiovascular:  Negative for chest pain, palpitations and leg swelling.  Gastrointestinal:  Negative for abdominal pain.  Endocrine: Negative for polydipsia.  Skin:  Negative for rash.  Neurological:  Negative for dizziness, weakness and headaches.  Hematological:  Does not bruise/bleed easily.  All other systems reviewed and are negative.     Objective:   Physical Exam Vitals and nursing note reviewed.  Constitutional:      General: She is not in acute distress.    Appearance: Normal appearance. She is well-developed.  HENT:     Head: Normocephalic.     Right Ear: Tympanic membrane normal.     Left Ear: Tympanic membrane normal.     Nose: Nose normal.     Mouth/Throat:     Mouth: Mucous membranes are moist.  Eyes:     Pupils: Pupils are equal, round, and reactive to light.  Neck:      Vascular: No carotid bruit or JVD.  Cardiovascular:     Rate and Rhythm: Normal rate and regular rhythm.     Heart sounds: Normal heart sounds.  Pulmonary:     Effort: Pulmonary effort is normal. No respiratory distress.     Breath sounds: Normal breath sounds. No wheezing or rales.  Chest:     Chest wall: No tenderness.  Abdominal:     General: Bowel sounds are normal. There is no distension or abdominal bruit.     Palpations:  Abdomen is soft. There is no hepatomegaly, splenomegaly, mass or pulsatile mass.     Tenderness: There is no abdominal tenderness.  Musculoskeletal:        General: Normal range of motion.     Cervical back: Normal range of motion and neck supple.  Lymphadenopathy:     Cervical: No cervical adenopathy.  Skin:    General: Skin is warm and dry.  Neurological:     Mental Status: She is alert and oriented to person, place, and time.     Deep Tendon Reflexes: Reflexes are normal and symmetric.  Psychiatric:        Behavior: Behavior normal.        Thought Content: Thought content normal.        Judgment: Judgment normal.    BP (!) 147/89    Pulse 95    Temp (!) 97.2 F (36.2 C) (Oral)    Resp 20    Ht 5\' 7"  (1.702 m)    Wt 172 lb (78 kg)    BMI 26.94 kg/m         Assessment & Plan:  SUNNY AGUON comes in today with chief complaint of Medical Management of Chronic Issues   Diagnosis and orders addressed:  1. Essential hypertension Low sodium  diet - amLODipine-benazepril (LOTREL) 10-20 MG capsule; Take 1 capsule by mouth daily.  Dispense: 90 capsule; Refill: 1  2. Coronary artery disease due to lipid rich plaque  3. Simple chronic bronchitis (HCC) Smoking cessation encouraged  4. Mixed hyperlipidemia Low fat diet - rosuvastatin (CRESTOR) 10 MG tablet; Take 1 tablet (10 mg total) by mouth daily.  Dispense: 90 tablet; Refill: 1  5. GAD (generalized anxiety disorder) Stress management - ALPRAZolam (XANAX) 0.5 MG tablet; Take 1 tablet (0.5  mg total) by mouth 2 (two) times daily as needed for anxiety.  Dispense: 60 tablet; Refill: 5  6. Primary insomnia Bedtime routine - traZODone (DESYREL) 150 MG tablet; Take 1 tablet (150 mg total) by mouth at bedtime.  Dispense: 90 tablet; Refill: 1  7. Nicotine dependence, cigarettes, uncomplicated Smoking cessation encouraged  8. Chronic midline low back pain with left-sided sciatica Keep follow up with Dr  [name]  9. Chronic midline low back pain without sciatica - gabapentin (NEURONTIN) 300 MG capsule; TAKE 1 CAPSULE THREE TIMES DAILY  Dispense: 270 capsule; Refill: 1   Labs pending Health Maintenance reviewed Diet and exercise encouraged  Follow up plan: 6 months   Mary-Margaret Daneil Dan, FNP

## 2021-08-14 DIAGNOSIS — M545 Low back pain, unspecified: Secondary | ICD-10-CM | POA: Diagnosis not present

## 2021-08-27 ENCOUNTER — Ambulatory Visit (INDEPENDENT_AMBULATORY_CARE_PROVIDER_SITE_OTHER): Payer: Medicare HMO

## 2021-08-27 ENCOUNTER — Ambulatory Visit (INDEPENDENT_AMBULATORY_CARE_PROVIDER_SITE_OTHER): Payer: Medicare HMO | Admitting: Family Medicine

## 2021-08-27 ENCOUNTER — Encounter: Payer: Self-pay | Admitting: Family Medicine

## 2021-08-27 VITALS — Wt 176.0 lb

## 2021-08-27 VITALS — BP 148/87 | HR 90 | Temp 98.3°F | Ht 67.0 in | Wt 176.2 lb

## 2021-08-27 DIAGNOSIS — Z1211 Encounter for screening for malignant neoplasm of colon: Secondary | ICD-10-CM | POA: Diagnosis not present

## 2021-08-27 DIAGNOSIS — Z0000 Encounter for general adult medical examination without abnormal findings: Secondary | ICD-10-CM | POA: Diagnosis not present

## 2021-08-27 DIAGNOSIS — M5442 Lumbago with sciatica, left side: Secondary | ICD-10-CM

## 2021-08-27 DIAGNOSIS — Z780 Asymptomatic menopausal state: Secondary | ICD-10-CM

## 2021-08-27 DIAGNOSIS — G8929 Other chronic pain: Secondary | ICD-10-CM | POA: Diagnosis not present

## 2021-08-27 MED ORDER — METHYLPREDNISOLONE ACETATE 40 MG/ML IJ SUSP
60.0000 mg | Freq: Once | INTRAMUSCULAR | Status: AC
  Administered 2021-08-27: 60 mg via INTRAMUSCULAR

## 2021-08-27 MED ORDER — KETOROLAC TROMETHAMINE 30 MG/ML IJ SOLN
30.0000 mg | Freq: Once | INTRAMUSCULAR | Status: AC
  Administered 2021-08-27: 30 mg via INTRAMUSCULAR

## 2021-08-27 NOTE — Patient Instructions (Signed)
Wendy Arnold , ?Thank you for taking time to come for your Medicare Wellness Visit. I appreciate your ongoing commitment to your health goals. Please review the following plan we discussed and let me know if I can assist you in the future.  ? ?Screening recommendations/referrals: ?Colonoscopy: Ordered Cologuard today  - expect to get this in the mail soon ?Mammogram: Done 12/17/2020 - Repeat annually ?Bone Density: Ordered - Try to get this done at next visit or schedule before if able. ?Recommended yearly ophthalmology/optometry visit for glaucoma screening and checkup ?Recommended yearly dental visit for hygiene and checkup ? ?Vaccinations: ?Influenza vaccine: Done 03/30/2021 - Repeat annually ?Pneumococcal vaccine: Done 1117/2015 & 08/25/2015 ?Tdap vaccine: Done 02/12/2014 - Repeat in 10 years ?Shingles vaccine: Done 03/30/2021 - get second dose at next visit*   ?Covid-19:Done Linwood Dibbles 09/24/2019, Moderna:  11/11/2020 & 04/21/2021 ? ?Advanced directives: Advance directive discussed with you today. Even though you declined this today, please call our office should you change your mind, and we can give you the proper paperwork for you to fill out.  ? ?Conditions/risks identified: Aim for 30 minutes of exercise or brisk walking, 6-8 glasses of water, and 5 servings of fruits and vegetables each day.  ? ?If you wish to quit smoking, help is available. For free tobacco cessation program offerings call the [facility] at 321-868-7786 or Live Well Line at (805)108-5034. You may also visit www.[facility].com or email livelifewell@[email or web address] .[email or web address] for more information on other programs.  ? ?You may also call 1-800-QUIT-NOW (820-735-0806) or visit www.[facility].ch or www.[facility].org for additional resources on smoking cessation.  ? ? ?Next appointment: Follow up in one year for your annual wellness visit  ? ? ?Preventive Care 39 Years and Older, Female ?Preventive care refers to lifestyle choices and visits with  your health care provider that can promote health and wellness. ?What does preventive care include? ?A yearly physical exam. This is also called an annual well check. ?Dental exams once or twice a year. ?Routine eye exams. Ask your health care provider how often you should have your eyes checked. ?Personal lifestyle choices, including: ?Daily care of your teeth and gums. ?Regular physical activity. ?Eating a healthy diet. ?Avoiding tobacco and drug use. ?Limiting alcohol use. ?Practicing safe sex. ?Taking low-dose aspirin every day. ?Taking vitamin and mineral supplements as recommended by your health care provider. ?What happens during an annual well check? ?The services and screenings done by your health care provider during your annual well check will depend on your age, overall health, lifestyle risk factors, and family history of disease. ?Counseling  ?Your health care provider may ask you questions about your: ?Alcohol use. ?Tobacco use. ?Drug use. ?Emotional well-being. ?Home and relationship well-being. ?Sexual activity. ?Eating habits. ?History of falls. ?Memory and ability to understand (cognition). ?Work and work Astronomer. ?Reproductive health. ?Screening  ?You may have the following tests or measurements: ?Height, weight, and BMI. ?Blood pressure. ?Lipid and cholesterol levels. These may be checked every 5 years, or more frequently if you are over 62 years old. ?Skin check. ?Lung cancer screening. You may have this screening every year starting at age 10 if you have a 30-pack-year history of smoking and currently smoke or have quit within the past 15 years. ?Fecal occult blood test (FOBT) of the stool. You may have this test every year starting at age 82. ?Flexible sigmoidoscopy or colonoscopy. You may have a sigmoidoscopy every 5 years or a colonoscopy every 10 years starting at age 42. ?Hepatitis  C blood test. ?Hepatitis B blood test. ?Sexually transmitted disease (STD) testing. ?Diabetes screening.  This is done by checking your blood sugar (glucose) after you have not eaten for a while (fasting). You may have this done every 1-3 years. ?Bone density scan. This is done to screen for osteoporosis. You may have this done starting at age 38. ?Mammogram. This may be done every 1-2 years. Talk to your health care provider about how often you should have regular mammograms. ?Talk with your health care provider about your test results, treatment options, and if necessary, the need for more tests. ?Vaccines  ?Your health care provider may recommend certain vaccines, such as: ?Influenza vaccine. This is recommended every year. ?Tetanus, diphtheria, and acellular pertussis (Tdap, Td) vaccine. You may need a Td booster every 10 years. ?Zoster vaccine. You may need this after age 33. ?Pneumococcal 13-valent conjugate (PCV13) vaccine. One dose is recommended after age 74. ?Pneumococcal polysaccharide (PPSV23) vaccine. One dose is recommended after age 43. ?Talk to your health care provider about which screenings and vaccines you need and how often you need them. ?This information is not intended to replace advice given to you by your health care provider. Make sure you discuss any questions you have with your health care provider. ?Document Released: 06/06/2015 Document Revised: 01/28/2016 Document Reviewed: 03/11/2015 ?Elsevier Interactive Patient Education ? 2017 [employer] ? ?Fall Prevention in the Home ?Falls can cause injuries. They can happen to people of all ages. There are many things you can do to make your home safe and to help prevent falls. ?What can I do on the outside of my home? ?Regularly fix the edges of walkways and driveways and fix any cracks. ?Remove anything that might make you trip as you walk through a door, such as a raised step or threshold. ?Trim any bushes or trees on the path to your home. ?Use bright outdoor lighting. ?Clear any walking paths of anything that might make someone trip, such as  rocks or tools. ?Regularly check to see if handrails are loose or broken. Make sure that both sides of any steps have handrails. ?Any raised decks and porches should have guardrails on the edges. ?Have any leaves, snow, or ice cleared regularly. ?Use sand or salt on walking paths during winter. ?Clean up any spills in your garage right away. This includes oil or grease spills. ?What can I do in the bathroom? ?Use night lights. ?Install grab bars by the toilet and in the tub and shower. Do not use towel bars as grab bars. ?Use non-skid mats or decals in the tub or shower. ?If you need to sit down in the shower, use a plastic, non-slip stool. ?Keep the floor dry. Clean up any water that spills on the floor as soon as it happens. ?Remove soap buildup in the tub or shower regularly. ?Attach bath mats securely with double-sided non-slip rug tape. ?Do not have throw rugs and other things on the floor that can make you trip. ?What can I do in the bedroom? ?Use night lights. ?Make sure that you have a light by your bed that is easy to reach. ?Do not use any sheets or blankets that are too big for your bed. They should not hang down onto the floor. ?Have a firm chair that has side arms. You can use this for support while you get dressed. ?Do not have throw rugs and other things on the floor that can make you trip. ?What can I do in the  kitchen? ?Clean up any spills right away. ?Avoid walking on wet floors. ?Keep items that you use a lot in easy-to-reach places. ?If you need to reach something above you, use a strong step stool that has a grab bar. ?Keep electrical cords out of the way. ?Do not use floor polish or wax that makes floors slippery. If you must use wax, use non-skid floor wax. ?Do not have throw rugs and other things on the floor that can make you trip. ?What can I do with my stairs? ?Do not leave any items on the stairs. ?Make sure that there are handrails on both sides of the stairs and use them. Fix handrails  that are broken or loose. Make sure that handrails are as long as the stairways. ?Check any carpeting to make sure that it is firmly attached to the stairs. Fix any carpet that is loose or worn. ?[place]

## 2021-08-27 NOTE — Progress Notes (Signed)
?  ? ?Subjective:  ?Patient ID: Wendy Arnold, female    DOB: [birth year], 71 y.o.   MRN: [id number] ? ?Patient Care Team: ?Bennie Pierini, FNP as PCP - General (Family Medicine)  ? ?Chief Complaint:  Back Pain (With bilateral radiculopathy /) ? ? ?HPI: ?Wendy Arnold is a 71 y.o. female presenting on 08/27/2021 for Back Pain (With bilateral radiculopathy /) ? ? ?Pt presents today with complaints of lower back pain which radiates to her left leg. This is a chronic problem which has worsened over the last week. She denies new injury. States pain is sharp, stabbing, and shooting, 8/10. ? ?Back Pain ?This is a chronic problem. The problem occurs daily. The problem has been waxing and waning since onset. The pain is present in the lumbar spine and sacro-iliac. The quality of the pain is described as aching, burning, shooting and stabbing. The pain radiates to the left thigh. The pain is at a severity of 8/10. The symptoms are aggravated by stress, twisting, standing, lying down, position and sitting. Associated symptoms include leg pain, paresthesias and tingling. Pertinent negatives include no abdominal pain, bladder incontinence, bowel incontinence, chest pain, dysuria, fever, headaches, numbness, paresis, pelvic pain, perianal numbness, weakness or weight loss. She has tried analgesics, muscle relaxant, ice and heat for the symptoms. The treatment provided no relief.  ? ? ? ?Relevant past medical, surgical, family, and social history reviewed and updated as indicated.  ?Allergies and medications reviewed and updated. Data reviewed: Chart in Epic. ? ? ?Past Medical History:  ?Diagnosis Date  ? Anxiety   ? Back pain   ? Depression   ? Diverticulitis   ? Insomnia   ? Migraines   ? ? ?Past Surgical History:  ?Procedure Laterality Date  ? ABDOMINAL HYSTERECTOMY    ? BACK SURGERY    ? SINUS ENDO WITH FUSION    ? TOE SURGERY    ? ? ?Social History  ? ?Socioeconomic History  ? Marital status: Divorced  ?  Spouse  name: Not on file  ? Number of children: Not on file  ? Years of education: Not on file  ? Highest education level: Not on file  ?Occupational History  ? Occupation: retired  ?Tobacco Use  ? Smoking status: Every Day  ?  Packs/day: 0.25  ?  Years: 40.00  ?  Pack years: 10.00  ?  Types: Cigarettes  ? Smokeless tobacco: Never  ?Substance and Sexual Activity  ? Alcohol use: Not Currently  ?  Comment: rarely  ? Drug use: No  ? Sexual activity: Never  ?  Birth control/protection: Surgical  ?Other Topics Concern  ? Not on file  ?Social History Narrative  ? Her mentally disabled brother lives with her  ? ?Social Determinants of Health  ? ?Financial Resource Strain: Not on file  ?Food Insecurity: Not on file  ?Transportation Needs: Not on file  ?Physical Activity: Not on file  ?Stress: Not on file  ?Social Connections: Not on file  ?Intimate Partner Violence: Not on file  ? ? ?Outpatient Encounter Medications as of 08/27/2021  ?Medication Sig  ? ALPRAZolam (XANAX) 0.5 MG tablet Take 1 tablet (0.5 mg total) by mouth 2 (two) times daily as needed for anxiety.  ? amLODipine-benazepril (LOTREL) 10-20 MG capsule Take 1 capsule by mouth daily.  ? aspirin EC 81 MG tablet Take 81 mg by mouth at bedtime.   ? bacitracin-polymyxin b (POLYSPORIN) ophthalmic ointment Place 1 application into both eyes 2 (two) times  daily. apply to eye every 12 hours while awake  ? chlorhexidine (PERIDEX) 0.12 % solution SMARTSIG:0.5 Ounce(s) By Mouth Twice Daily  ? cyclobenzaprine (FLEXERIL) 10 MG tablet Take 1 tablet (10 mg total) by mouth 2 (two) times daily as needed for muscle spasms.  ? DULoxetine (CYMBALTA) 30 MG capsule Take 1 capsule (30 mg total) by mouth daily. X 1 week, then 60 mg daily- for nerve and back pain-take with food.  ? ezetimibe (ZETIA) 10 MG tablet Take 1 tablet (10 mg total) by mouth daily.  ? gabapentin (NEURONTIN) 300 MG capsule TAKE 1 CAPSULE THREE TIMES DAILY  ? ibuprofen (ADVIL) 800 MG tablet Take 1 tablet (800 mg total) by  mouth every 8 (eight) hours as needed.  ? levETIRAcetam (KEPPRA) 500 MG tablet Take 1 tablet (500 mg total) by mouth 2 (two) times daily.  ? Multiple Vitamins-Minerals (ALIVE WOMENS 50+ PO) Take by mouth.  ? rosuvastatin (CRESTOR) 10 MG tablet Take 1 tablet (10 mg total) by mouth daily.  ? traZODone (DESYREL) 150 MG tablet Take 1 tablet (150 mg total) by mouth at bedtime.  ? [EXPIRED] ketorolac (TORADOL) 30 MG/ML injection 30 mg   ? [EXPIRED] methylPREDNISolone acetate (DEPO-MEDROL) injection 60 mg   ? ?No facility-administered encounter medications on file as of 08/27/2021.  ? ? ?Allergies  ?Allergen Reactions  ? Amoxicillin-Pot Clavulanate   ? Flagyl [Metronidazole] Nausea And Vomiting and Other (See Comments)  ?  Severe headaches; leg cramps.  ? ? ?Review of Systems  ?Constitutional:  Positive for activity change and appetite change. Negative for chills, diaphoresis, fatigue, fever, unexpected weight change and weight loss.  ?HENT: Negative.    ?Eyes: Negative.   ?Respiratory:  Negative for cough, chest tightness and shortness of breath.   ?Cardiovascular:  Negative for chest pain, palpitations and leg swelling.  ?Gastrointestinal:  Negative for abdominal pain, blood in stool, bowel incontinence, constipation, diarrhea, nausea and vomiting.  ?Endocrine: Negative.   ?Genitourinary:  Negative for bladder incontinence, decreased urine volume, difficulty urinating, dysuria, frequency, pelvic pain and urgency.  ?Musculoskeletal:  Positive for back pain and gait problem. Negative for arthralgias, joint swelling, myalgias, neck pain and neck stiffness.  ?Skin: Negative.   ?Allergic/Immunologic: Negative.   ?Neurological:  Positive for tingling and paresthesias. Negative for dizziness, tremors, seizures, syncope, facial asymmetry, speech difficulty, weakness, light-headedness, numbness and headaches.  ?Hematological: Negative.   ?Psychiatric/Behavioral:  Negative for confusion, hallucinations, sleep disturbance and  suicidal ideas.   ?All other systems reviewed and are negative. ? ?   ? ?Objective:  ?BP (!) 148/87   Pulse 90   Temp 98.3 ?F (36.8 ?C)   Ht 5\' 7"  (1.702 m)   Wt 176 lb 3.2 oz (79.9 kg)   SpO2 94%   BMI 27.60 kg/m?   ? ?Wt Readings from Last 3 Encounters:  ?08/27/21 176 lb 3.2 oz (79.9 kg)  ?07/27/21 172 lb (78 kg)  ?06/05/21 175 lb (79.4 kg)  ? ? ?Physical Exam ?Vitals and nursing note reviewed.  ?Constitutional:   ?   General: She is not in acute distress. ?   Appearance: Normal appearance. She is well-developed and well-groomed. She is not ill-appearing, toxic-appearing or diaphoretic.  ?HENT:  ?   Head: Normocephalic and atraumatic.  ?   Jaw: There is normal jaw occlusion.  ?   Right Ear: Hearing normal.  ?   Left Ear: Hearing normal.  ?   Nose: Nose normal.  ?   Mouth/Throat:  ?   Lips:  Pink.  ?   Mouth: Mucous membranes are moist.  ?   Pharynx: Oropharynx is clear. Uvula midline.  ?Eyes:  ?   General: Lids are normal.  ?   Extraocular Movements: Extraocular movements intact.  ?   Conjunctiva/sclera: Conjunctivae normal.  ?   Pupils: Pupils are equal, round, and reactive to light.  ?Neck:  ?   Thyroid: No thyroid mass, thyromegaly or thyroid tenderness.  ?   Vascular: No carotid bruit or JVD.  ?   Trachea: Trachea and phonation normal.  ?Cardiovascular:  ?   Rate and Rhythm: Normal rate and regular rhythm.  ?   Chest Wall: PMI is not displaced.  ?   Pulses: Normal pulses.  ?   Heart sounds: Normal heart sounds. No murmur heard. ?  No friction rub. No gallop.  ?Pulmonary:  ?   Effort: Pulmonary effort is normal. No respiratory distress.  ?   Breath sounds: Normal breath sounds. No wheezing.  ?Abdominal:  ?   General: Bowel sounds are normal. There is no abdominal bruit.  ?   Palpations: Abdomen is soft. There is no hepatomegaly or splenomegaly.  ?Musculoskeletal:  ?   Cervical back: Normal range of motion and neck supple.  ?   Thoracic back: Normal.  ?   Lumbar back: Tenderness present. No swelling, edema,  deformity, signs of trauma, lacerations, spasms or bony tenderness. Decreased range of motion. Positive right straight leg raise test and positive left straight leg raise test. No scoliosis.  ?   Right hip: Normal.  ?

## 2021-08-27 NOTE — Progress Notes (Signed)
? ?Subjective:  ? Wendy Arnold is a 71 y.o. female who presents for Medicare Annual (Subsequent) preventive examination. ? ?Virtual Visit via Telephone Note ? ?I connected with  Wendy Arnold on 08/27/21 at  3:30 PM EDT by telephone and verified that I am speaking with the correct person using two identifiers. ? ?Location: ?Patient: Home ?Provider: [facility] ?Persons participating in the virtual visit: patient/Nurse Health Advisor ?  ?I discussed the limitations, risks, security and privacy concerns of performing an evaluation and management service by telephone and the availability of in person appointments. The patient expressed understanding and agreed to proceed. ? ?Interactive audio and video telecommunications were attempted between this nurse and patient, however failed, due to patient having technical difficulties OR patient did not have access to video capability.  We continued and completed visit with audio only. ? ?Some vital signs may be absent or patient reported.  ? ?Wendy Batley Hurman Horn, LPN  ? ?Review of Systems    ? ?Cardiac Risk Factors include: advanced age (>79men, >23 women);dyslipidemia;hypertension;sedentary lifestyle;smoking/ tobacco exposure;Other (see comment), Risk factor comments: COPD, CAD ? ?   ?Objective:  ?  ?Today's Vitals  ? 08/27/21 1525  ?Weight: 176 lb (79.8 kg)  ?PainSc: 8   ? ?Body mass index is 27.57 kg/m?. ? ? ?  08/27/2021  ?  3:35 PM 05/18/2021  ?  1:07 PM 08/26/2020  ?  2:38 PM 02/26/2014  ?  2:58 PM 07/24/2011  ?  4:38 AM  ?Advanced Directives  ?Does Patient Have a Medical Advance Directive? No No No No Patient does not have advance directive  ?Would patient like information on creating a medical advance directive? No - Patient declined  No - Patient declined No - patient declined information   ?Pre-existing out of facility DNR order (yellow form or pink MOST form)     No  ? ? ?Current Medications (verified) ?Outpatient Encounter Medications as of 08/27/2021  ?Medication Sig  ?  ALPRAZolam (XANAX) 0.5 MG tablet Take 1 tablet (0.5 mg total) by mouth 2 (two) times daily as needed for anxiety.  ? amLODipine-benazepril (LOTREL) 10-20 MG capsule Take 1 capsule by mouth daily.  ? aspirin EC 81 MG tablet Take 81 mg by mouth at bedtime.   ? cyclobenzaprine (FLEXERIL) 10 MG tablet Take 1 tablet (10 mg total) by mouth 2 (two) times daily as needed for muscle spasms.  ? ezetimibe (ZETIA) 10 MG tablet Take 1 tablet (10 mg total) by mouth daily.  ? gabapentin (NEURONTIN) 300 MG capsule TAKE 1 CAPSULE THREE TIMES DAILY  ? Multiple Vitamins-Minerals (ALIVE WOMENS 50+ PO) Take by mouth.  ? traZODone (DESYREL) 150 MG tablet Take 1 tablet (150 mg total) by mouth at bedtime.  ? [DISCONTINUED] bacitracin-polymyxin b (POLYSPORIN) ophthalmic ointment Place 1 application into both eyes 2 (two) times daily. apply to eye every 12 hours while awake (Patient not taking: Reported on 08/27/2021)  ? [DISCONTINUED] chlorhexidine (PERIDEX) 0.12 % solution SMARTSIG:0.5 Ounce(s) By Mouth Twice Daily (Patient not taking: Reported on 08/27/2021)  ? [DISCONTINUED] DULoxetine (CYMBALTA) 30 MG capsule Take 1 capsule (30 mg total) by mouth daily. X 1 week, then 60 mg daily- for nerve and back pain-take with food. (Patient not taking: Reported on 08/27/2021)  ? [DISCONTINUED] ibuprofen (ADVIL) 800 MG tablet Take 1 tablet (800 mg total) by mouth every 8 (eight) hours as needed. (Patient not taking: Reported on 08/27/2021)  ? [DISCONTINUED] levETIRAcetam (KEPPRA) 500 MG tablet Take 1 tablet (500 mg total) by  mouth 2 (two) times daily. (Patient not taking: Reported on 08/27/2021)  ? [DISCONTINUED] rosuvastatin (CRESTOR) 10 MG tablet Take 1 tablet (10 mg total) by mouth daily. (Patient not taking: Reported on 08/27/2021)  ? ?No facility-administered encounter medications on file as of 08/27/2021.  ? ? ?Allergies (verified) ?Amoxicillin-pot clavulanate and Flagyl [metronidazole]  ? ?History: ?Past Medical History:  ?Diagnosis Date  ? Anxiety   ?  Back pain   ? Depression   ? Diverticulitis   ? Insomnia   ? Migraines   ? ?Past Surgical History:  ?Procedure Laterality Date  ? ABDOMINAL HYSTERECTOMY    ? BACK SURGERY    ? SINUS ENDO WITH FUSION    ? TOE SURGERY    ? ?Family History  ?Problem Relation Age of Onset  ? Heart failure Mother   ? Cancer Father   ? Cancer Brother   ? Cancer Brother   ? Cancer Brother   ? Breast cancer Neg Hx   ? ?Social History  ? ?Socioeconomic History  ? Marital status: Divorced  ?  Spouse name: Not on file  ? Number of children: 1  ? Years of education: Not on file  ? Highest education level: Not on file  ?Occupational History  ? Occupation: retired  ?Tobacco Use  ? Smoking status: Every Day  ?  Packs/day: 0.25  ?  Years: 40.00  ?  Pack years: 10.00  ?  Types: Cigarettes  ? Smokeless tobacco: Never  ?Substance and Sexual Activity  ? Alcohol use: Not Currently  ?  Comment: rarely  ? Drug use: No  ? Sexual activity: Never  ?  Birth control/protection: Surgical  ?Other Topics Concern  ? Not on file  ?Social History Narrative  ? Her mentally disabled brother lives with her  ? She had one son who passed away in 10-05-16  ? ?Social Determinants of Health  ? ?Financial Resource Strain: Low Risk   ? Difficulty of Paying Living Expenses: Not hard at all  ?Food Insecurity: No Food Insecurity  ? Worried About Programme researcher, broadcasting/film/video in the Last Year: Never true  ? Ran Out of Food in the Last Year: Never true  ?Transportation Needs: No Transportation Needs  ? Lack of Transportation (Medical): No  ? Lack of Transportation (Non-Medical): No  ?Physical Activity: Inactive  ? Days of Exercise per Week: 0 days  ? Minutes of Exercise per Session: 0 min  ?Stress: Stress Concern Present  ? Feeling of Stress : To some extent  ?Social Connections: Socially Isolated  ? Frequency of Communication with Friends and Family: More than three times a week  ? Frequency of Social Gatherings with Friends and Family: More than three times a week  ? Attends Religious  Services: Never  ? Active Member of Clubs or Organizations: No  ? Attends Banker Meetings: Never  ? Marital Status: Divorced  ? ? ?Tobacco Counseling ?Ready to quit: Not Answered ?Counseling given: Not Answered ? ? ?Clinical Intake: ? ?Pre-visit preparation completed: Yes ? ?Pain : 0-10 ?Pain Score: 8  ?Pain Type: Acute pain ?Pain Location: Back ?Pain Orientation: Lower, Mid ?Pain Descriptors / Indicators: Aching, Crying, Tender, Sharp ?Pain Onset: In the past 7 days ?Pain Frequency: Constant ? ?  ? ?BMI - recorded: 27.57 ?Nutritional Status: BMI 25 -29 Overweight ?Nutritional Risks: None ?Diabetes: No ? ?How often do you need to have someone help you when you read instructions, pamphlets, or other written materials from your doctor or pharmacy?:  1 - Never ? ?Diabetic? no ? ?Interpreter Needed?: No ? ?Information entered by :: Yamaira Spinner, LPN ? ? ?Activities of Daily Living ? ?  08/27/2021  ?  3:35 PM  ?In your present state of health, do you have any difficulty performing the following activities:  ?Hearing? 0  ?Vision? 0  ?Difficulty concentrating or making decisions? 0  ?Walking or climbing stairs? 1  ?Comment painful  ?Dressing or bathing? 0  ?Doing errands, shopping? 0  ?Preparing Food and eating ? N  ?Using the Toilet? N  ?In the past six months, have you accidently leaked urine? N  ?Do you have problems with loss of bowel control? N  ?Managing your Medications? N  ?Managing your Finances? N  ?Housekeeping or managing your Housekeeping? Y  ?Comment painful  ? ? ?Patient Care Team: ?Bennie Pierini, FNP as PCP - General (Family Medicine) ?Ortho, [facility] (Specialist) ? ?Indicate any recent Medical Services you may have received from other than [facility] providers in the past year (date may be approximate). ? ?   ?Assessment:  ? This is a routine wellness examination for Carney. ? ?Hearing/Vision screen ?Hearing Screening - Comments:: Denies hearing difficulties   ?Vision Screening - Comments::  Wears rx glasses - behind with routine eye exams with [facility] - sometimes goes to the one in [place] ? ?Dietary issues and exercise activities discussed: ?Current Exercise Habits: Home exercise routine, Type of exer

## 2021-09-07 DIAGNOSIS — Z1211 Encounter for screening for malignant neoplasm of colon: Secondary | ICD-10-CM | POA: Diagnosis not present

## 2021-09-14 LAB — COLOGUARD: COLOGUARD: NEGATIVE

## 2021-09-18 ENCOUNTER — Telehealth: Payer: Self-pay | Admitting: Nurse Practitioner

## 2021-09-18 ENCOUNTER — Other Ambulatory Visit: Payer: Medicare HMO

## 2021-09-18 DIAGNOSIS — I10 Essential (primary) hypertension: Secondary | ICD-10-CM

## 2021-09-18 MED ORDER — AMLODIPINE BESY-BENAZEPRIL HCL 10-20 MG PO CAPS: 1.0000 | ORAL_CAPSULE | Freq: Every day | ORAL | 0 refills | Status: DC

## 2021-09-18 NOTE — Telephone Encounter (Signed)
Pt aware refill sent to [facility]  

## 2021-09-18 NOTE — Telephone Encounter (Signed)
?  Prescription Request ? ?09/18/2021 ? ?Is this a "Controlled Substance" medicine? no ? ?Have you seen your PCP in the last 2 weeks? no ? ?If YES, route message to pool  -  If NO, patient needs to be scheduled for appointment. ? ?What is the name of the medication or equipment? amLODipine-benazepril (LOTREL) 10-20 MG capsule ? ?Have you contacted your pharmacy to request a refill?  Yes, prescription already sent to [facility] and they have sent out but patient will be out before it gets there. Can a 10 day supply be sent to patient?   ? ?Which pharmacy would you like this sent to? [facility]  ? ? ?Patient notified that their request is being sent to the clinical staff for review and that they should receive a response within 2 business days.  ?  ?

## 2021-09-28 ENCOUNTER — Ambulatory Visit: Payer: Medicare HMO | Admitting: Nurse Practitioner

## 2021-10-02 ENCOUNTER — Ambulatory Visit (INDEPENDENT_AMBULATORY_CARE_PROVIDER_SITE_OTHER): Payer: Medicare HMO | Admitting: Family Medicine

## 2021-10-02 ENCOUNTER — Encounter: Payer: Self-pay | Admitting: Family Medicine

## 2021-10-02 VITALS — BP 129/83 | HR 117 | Temp 98.9°F | Ht 67.0 in | Wt 175.0 lb

## 2021-10-02 DIAGNOSIS — M5442 Lumbago with sciatica, left side: Secondary | ICD-10-CM

## 2021-10-02 DIAGNOSIS — G8929 Other chronic pain: Secondary | ICD-10-CM | POA: Diagnosis not present

## 2021-10-02 MED ORDER — METHYLPREDNISOLONE ACETATE 80 MG/ML IJ SUSP
60.0000 mg | Freq: Once | INTRAMUSCULAR | Status: AC
  Administered 2021-10-02: 60 mg via INTRAMUSCULAR

## 2021-10-02 MED ORDER — KETOROLAC TROMETHAMINE 30 MG/ML IJ SOLN
30.0000 mg | Freq: Once | INTRAMUSCULAR | Status: AC
  Administered 2021-10-02: 30 mg via INTRAMUSCULAR

## 2021-10-02 MED ORDER — METHYLPREDNISOLONE ACETATE 80 MG/ML IJ SUSP: 60.0000 mg | Freq: Once | INTRAMUSCULAR | Status: DC

## 2021-10-02 NOTE — Progress Notes (Signed)
?  ? ?Subjective:  ?Patient ID: Wendy Arnold, female    DOB: 1950-10-16, 71 y.o.   MRN: [id number] ? ?Patient Care Team: ?Chevis Pretty, FNP as PCP - General (Family Medicine) ?Or[facility], [facility] (Specialist)  ? ?Chief Complaint:  Back Pain ? ? ?HPI: ?Wendy Arnold is a 71 y.o. female presenting on 10/02/2021 for Back Pain ? ? ?Pt presents today with ongoing back pain, worse over the last few days. No new injuries. Has been seen by Dr. Garlan Fillers, imaging was completed and she was referred to a spine specialist for possible spinal injections. States she does not see then until next month.  ? ?Back Pain ?This is a chronic problem. The current episode started more than 1 year ago. The problem occurs constantly. The problem has been waxing and waning since onset. The pain is present in the gluteal, lumbar spine and sacro-iliac. The quality of the pain is described as aching, burning, shooting and stabbing. The pain radiates to the right thigh and left thigh. The pain is at a severity of 8/10. The pain is severe. The symptoms are aggravated by bending, position, standing, stress, twisting and sitting. Associated symptoms include leg pain and tingling. Pertinent negatives include no abdominal pain, bladder incontinence, bowel incontinence, chest pain, dysuria, fever, headaches, numbness, paresis, paresthesias, pelvic pain, perianal numbness, weakness or weight loss. She has tried muscle relaxant, NSAIDs, bed rest and analgesics for the symptoms. The treatment provided no relief.  ? ? ?Relevant past medical, surgical, family, and social history reviewed and updated as indicated.  ?Allergies and medications reviewed and updated. Data reviewed: Chart in Epic. ? ? ?Past Medical History:  ?Diagnosis Date  ? Anxiety   ? Back pain   ? Depression   ? Diverticulitis   ? Insomnia   ? Migraines   ? ? ?Past Surgical History:  ?Procedure Laterality Date  ? ABDOMINAL HYSTERECTOMY    ? BACK SURGERY    ? SINUS ENDO WITH FUSION    ?  TOE SURGERY    ? ? ?Social History  ? ?Socioeconomic History  ? Marital status: Divorced  ?  Spouse name: Not on file  ? Number of children: 1  ? Years of education: Not on file  ? Highest education level: Not on file  ?Occupational History  ? Occupation: retired  ?Tobacco Use  ? Smoking status: Every Day  ?  Packs/day: 0.25  ?  Years: 40.00  ?  Pack years: 10.00  ?  Types: Cigarettes  ? Smokeless tobacco: Never  ?Substance and Sexual Activity  ? Alcohol use: Not Currently  ?  Comment: rarely  ? Drug use: No  ? Sexual activity: Never  ?  Birth control/protection: Surgical  ?Other Topics Concern  ? Not on file  ?Social History Narrative  ? Her mentally disabled brother lives with her  ? She had one son who passed away in Sep 27, 2016  ? ?Social Determinants of Health  ? ?Financial Resource Strain: Low Risk   ? Difficulty of Paying Living Expenses: Not hard at all  ?Food Insecurity: No Food Insecurity  ? Worried About Charity fundraiser in the Last Year: Never true  ? Ran Out of Food in the Last Year: Never true  ?Transportation Needs: No Transportation Needs  ? Lack of Transportation (Medical): No  ? Lack of Transportation (Non-Medical): No  ?Physical Activity: Inactive  ? Days of Exercise per Week: 0 days  ? Minutes of Exercise per Session: 0 min  ?Stress:  Stress Concern Present  ? Feeling of Stress : To some extent  ?Social Connections: Socially Isolated  ? Frequency of Communication with Friends and Family: More than three times a week  ? Frequency of Social Gatherings with Friends and Family: More than three times a week  ? Attends Religious Services: Never  ? Active Member of Clubs or Organizations: No  ? Attends Archivist Meetings: Never  ? Marital Status: Divorced  ?Intimate Partner Violence: Not At Risk  ? Fear of Current or Ex-Partner: No  ? Emotionally Abused: No  ? Physically Abused: No  ? Sexually Abused: No  ? ? ?Outpatient Encounter Medications as of 10/02/2021  ?Medication Sig  ? ALPRAZolam (XANAX)  0.5 MG tablet Take 1 tablet (0.5 mg total) by mouth 2 (two) times daily as needed for anxiety.  ? amLODipine-benazepril (LOTREL) 10-20 MG capsule Take 1 capsule by mouth daily.  ? aspirin EC 81 MG tablet Take 81 mg by mouth at bedtime.   ? cyclobenzaprine (FLEXERIL) 10 MG tablet Take 1 tablet (10 mg total) by mouth 2 (two) times daily as needed for muscle spasms.  ? ezetimibe (ZETIA) 10 MG tablet Take 1 tablet (10 mg total) by mouth daily.  ? gabapentin (NEURONTIN) 300 MG capsule TAKE 1 CAPSULE THREE TIMES DAILY  ? Multiple Vitamins-Minerals (ALIVE WOMENS 50+ PO) Take by mouth.  ? traZODone (DESYREL) 150 MG tablet Take 1 tablet (150 mg total) by mouth at bedtime.  ? [EXPIRED] ketorolac (TORADOL) 30 MG/ML injection 30 mg   ? [DISCONTINUED] methylPREDNISolone acetate (DEPO-MEDROL) injection 60 mg   ? ?No facility-administered encounter medications on file as of 10/02/2021.  ? ? ?Allergies  ?Allergen Reactions  ? Amoxicillin-Pot Clavulanate   ? Flagyl [Metronidazole] Nausea And Vomiting and Other (See Comments)  ?  Severe headaches; leg cramps.  ? ? ?Review of Systems  ?Constitutional:  Positive for activity change and appetite change. Negative for chills, diaphoresis, fatigue, fever, unexpected weight change and weight loss.  ?Respiratory:  Negative for cough and shortness of breath.   ?Cardiovascular:  Negative for chest pain, palpitations and leg swelling.  ?Gastrointestinal:  Negative for abdominal distention, abdominal pain, anal bleeding, blood in stool, bowel incontinence, constipation, diarrhea, nausea, rectal pain and vomiting.  ?Genitourinary:  Negative for bladder incontinence, decreased urine volume, difficulty urinating, dysuria and pelvic pain.  ?Musculoskeletal:  Positive for arthralgias, back pain, gait problem and myalgias. Negative for joint swelling, neck pain and neck stiffness.  ?Neurological:  Positive for tingling. Negative for dizziness, tremors, seizures, syncope, facial asymmetry, speech  difficulty, weakness, light-headedness, numbness, headaches and paresthesias.  ?Psychiatric/Behavioral:  Negative for confusion.   ?All other systems reviewed and are negative. ? ?   ? ?Objective:  ?BP 129/83   Pulse (!) 117   Temp 98.9 ?F (37.2 ?C)   Ht 5\' 7"  (1.702 m)   Wt 175 lb (79.4 kg)   SpO2 95%   BMI 27.41 kg/m?   ? ?Wt Readings from Last 3 Encounters:  ?10/02/21 175 lb (79.4 kg)  ?08/27/21 176 lb (79.8 kg)  ?08/27/21 176 lb 3.2 oz (79.9 kg)  ? ? ?Physical Exam ?Vitals and nursing note reviewed.  ?Constitutional:   ?   General: She is not in acute distress. ?   Appearance: She is not ill-appearing, toxic-appearing or diaphoretic.  ?   Comments: uncomfortable  ?HENT:  ?   Head: Normocephalic and atraumatic.  ?Eyes:  ?   Pupils: Pupils are equal, round, and reactive to light.  ?  Cardiovascular:  ?   Rate and Rhythm: Normal rate and regular rhythm.  ?Pulmonary:  ?   Breath sounds: Normal breath sounds.  ?Abdominal:  ?   General: Bowel sounds are normal.  ?   Palpations: Abdomen is soft.  ?Musculoskeletal:  ?   Cervical back: Normal.  ?   Thoracic back: Tenderness present. Normal range of motion.  ?   Lumbar back: Tenderness present. No swelling, edema, deformity, signs of trauma, lacerations, spasms or bony tenderness. Decreased range of motion. Positive right straight leg raise test and positive left straight leg raise test. No scoliosis.  ?   Right hip: Normal.  ?   Left hip: Normal.  ?   Right lower leg: No edema.  ?   Left lower leg: No edema.  ?Skin: ?   General: Skin is warm and dry.  ?   Capillary Refill: Capillary refill takes less than 2 seconds.  ?Neurological:  ?   General: No focal deficit present.  ?   Mental Status: She is alert and oriented to person, place, and time.  ?   Gait: Gait abnormal (slow, antalgic).  ?Psychiatric:     ?   Mood and Affect: Mood normal.     ?   Behavior: Behavior normal.     ?   Thought Content: Thought content normal.     ?   Judgment: Judgment normal.   ? ? ?Results for orders placed or performed in visit on 08/27/21  ?Cologuard  ?Result Value Ref Range  ? COLOGUARD Negative Negative  ? ?   ? ?Pertinent labs & imaging results that were available during my care of the pati

## 2021-10-07 ENCOUNTER — Encounter: Payer: Self-pay | Admitting: Nurse Practitioner

## 2021-10-07 ENCOUNTER — Ambulatory Visit (INDEPENDENT_AMBULATORY_CARE_PROVIDER_SITE_OTHER): Payer: Medicare HMO | Admitting: Nurse Practitioner

## 2021-10-07 VITALS — BP 112/79 | HR 105 | Temp 97.9°F | Resp 20 | Ht 67.0 in | Wt 177.0 lb

## 2021-10-07 DIAGNOSIS — M5442 Lumbago with sciatica, left side: Secondary | ICD-10-CM | POA: Diagnosis not present

## 2021-10-07 DIAGNOSIS — G8929 Other chronic pain: Secondary | ICD-10-CM | POA: Diagnosis not present

## 2021-10-07 MED ORDER — TRAMADOL HCL 50 MG PO TABS: 50.0000 mg | ORAL_TABLET | Freq: Two times a day (BID) | ORAL | 0 refills | Status: AC

## 2021-10-07 NOTE — Progress Notes (Signed)
? ?Subjective:  ? ? Patient ID: Wendy Arnold, female    DOB: Aug 12, 1950, 71 y.o.   MRN: [id number] ? ? ?Chief Complaint: Back Pain ? ? ?Back Pain ?This is a recurrent problem. The current episode started more than 1 year ago. The problem occurs constantly. The problem has been gradually worsening since onset. The pain is present in the lumbar spine. The pain does not radiate. The pain is at a severity of 9/10. The pain is severe. The pain is Worse during the day. The symptoms are aggravated by bending, position, standing and twisting. Pertinent negatives include no abdominal pain, chest pain, headaches, leg pain, paresthesias, weakness or weight loss.  ? ?She was seen in office on Friday with same complaint and was given toradol and prednisone. Shots helped  some. She has an appointment with Dr. Ethelene Hal on June 31. Needs help until she can see Dr. Ethelene Hal. ? ?Pain assessment: ?Cause of pain- arthritis ?Pain location- lumbar spine ?Pain on scale of 1-10- 8/10 currently ?Frequency- daily ?What increases pain-activity ?What makes pain Better-rest helps ?Effects on ADL - not been able to do house work or take care of her brother ?Any change in general medical condition-none ? ?Current opioids rx- will start ultram 50 BID ?# meds rx- 60 ?Effectiveness of current meds-none ?Adverse reactions from pain meds-none ?Morphine equivalent- ? ?Pill count performed-No ?Last drug screen - never ?( high risk q66m, moderate risk q60m, low risk yearly ) ?Urine drug screen today- Yes ?Was the [facility] reviewed- yes ? If yes were their any concerning findings? - no ? ? ?Overdose risk: 1 ? ?Pain contract signed on:04/02/21 ? ?Review of Systems  ?Constitutional:  Negative for diaphoresis and weight loss.  ?Eyes:  Negative for pain.  ?Respiratory:  Negative for shortness of breath.   ?Cardiovascular:  Negative for chest pain, palpitations and leg swelling.  ?Gastrointestinal:  Negative for abdominal pain.  ?Endocrine: Negative for  polydipsia.  ?Musculoskeletal:  Positive for back pain.  ?Skin:  Negative for rash.  ?Neurological:  Negative for dizziness, weakness, headaches and paresthesias.  ?Hematological:  Does not bruise/bleed easily.  ?All other systems reviewed and are negative. ? ?   ?Objective:  ? Physical Exam ?Constitutional:   ?   Appearance: Normal appearance.  ?Cardiovascular:  ?   Rate and Rhythm: Normal rate and regular rhythm.  ?   Heart sounds: Normal heart sounds.  ?Pulmonary:  ?   Effort: Pulmonary effort is normal.  ?   Breath sounds: Normal breath sounds.  ?Musculoskeletal:  ?   Comments: Iimited ROM of lumbar spine with pain on movement in any direction ?(-) SLR bil ?Patient pacing back and forth in exam room due to pain  ?Skin: ?   General: Skin is warm.  ?Neurological:  ?   General: No focal deficit present.  ?   Mental Status: She is alert and oriented to person, place, and time.  ?Psychiatric:     ?   Mood and Affect: Mood normal.     ?   Behavior: Behavior normal.  ? ?BP 112/79   Pulse (!) 105   Temp 97.9 ?F (36.6 ?C) (Temporal)   Resp 20   Ht 5\' 7"  (1.702 m)   Wt 177 lb (80.3 kg)   SpO2 92%   BMI 27.72 kg/m?  ? ? ? ? ?   ?Assessment & Plan:  ? ? in today with chief complaint of Back Pain ? ? ?1. Chronic midline low  back pain with left-sided sciatica ?Ice ?Rest ?Limit pain meds ? ? ? ?Meds ordered this encounter  ?Medications  ? traMADol (ULTRAM) 50 MG tablet  ?  Sig: Take 1 tablet (50 mg total) by mouth 2 (two) times daily.  ?  Dispense:  6 tablet  ?  Refill:  0  ?  Order Specific Question:   Supervising Provider  ?  Answer:   Arville Care A [[id number]]  ? ? ? ?The above assessment and management plan was discussed with the patient. The patient verbalized understanding of and has agreed to the management plan. Patient is aware to call the clinic if symptoms persist or worsen. Patient is aware when to return to the clinic for a follow-up visit. Patient educated on when it is appropriate to  go to the emergency department.  ? ?Mary-Margaret Daphine Deutscher, FNP ? ? ?

## 2021-10-07 NOTE — Patient Instructions (Signed)
Chronic Back Pain When back pain lasts longer than 3 months, it is called chronic back pain. Pain may get worse at certain times (flare-ups). There are things you can do at home to manage your pain. Follow these instructions at home: Pay attention to any changes in your symptoms. Take these actions to help with your pain: Managing pain and stiffness     If told, put ice on the painful area. Your doctor may tell you to use ice for 24-48 hours after the flare-up starts. To do this: Put ice in a plastic bag. Place a towel between your skin and the bag. Leave the ice on for 20 minutes, 2-3 times a day. If told, put heat on the painful area. Do this as often as told by your doctor. Use the heat source that your doctor recommends, such as a moist heat pack or a heating pad. Place a towel between your skin and the heat source. Leave the heat on for 20-30 minutes. Take off the heat if your skin turns bright red. This is especially important if you are unable to feel pain, heat, or cold. You may have a greater risk of getting burned. Soak in a warm bath. This can help relieve pain. Activity  Avoid bending and other activities that make pain worse. When standing: Keep your upper back and neck straight. Keep your shoulders pulled back. Avoid slouching. When sitting: Keep your back straight. Relax your shoulders. Do not round your shoulders or pull them backward. Do not sit or stand in one place for long periods of time. Take short rest breaks during the day. Lying down or standing is usually better than sitting. Resting can help relieve pain. When sitting or lying down for a long time, do some mild activity or stretching. This will help to prevent stiffness and pain. Get regular exercise. Ask your doctor what activities are safe for you. Do not lift anything that is heavier than 10 lb (4.5 kg) or the limit that you are told, until your doctor says that it is safe. To prevent injury when you lift  things: Bend your knees. Keep the weight close to your body. Avoid twisting. Sleep on a firm mattress. Try lying on your side with your knees slightly bent. If you lie on your back, put a pillow under your knees. Medicines Treatment may include medicines for pain and swelling taken by mouth or put on the skin, prescription pain medicine, or muscle relaxants. Take over-the-counter and prescription medicines only as told by your doctor. Ask your doctor if the medicine prescribed to you: Requires you to avoid driving or using machinery. Can cause trouble pooping (constipation). You may need to take these actions to prevent or treat trouble pooping: Drink enough fluid to keep your pee (urine) pale yellow. Take over-the-counter or prescription medicines. Eat foods that are high in fiber. These include beans, whole grains, and fresh fruits and vegetables. Limit foods that are high in fat and sugars. These include fried or sweet foods. General instructions Do not use any products that contain nicotine or tobacco, such as cigarettes, e-cigarettes, and chewing tobacco. If you need help quitting, ask your doctor. Keep all follow-up visits as told by your doctor. This is important. Contact a doctor if: Your pain does not get better with rest or medicine. Your pain gets worse, or you have new pain. You have a high fever. You lose weight very quickly. You have trouble doing your normal activities. Get help right away   if: One or both of your legs or feet feel weak. One or both of your legs or feet lose feeling (have numbness). You have trouble controlling when you poop (have a bowel movement) or pee (urinate). You have bad back pain and: You feel like you may vomit (nauseous), or you vomit. You have pain in your belly (abdomen). You have shortness of breath. You faint. Summary When back pain lasts longer than 3 months, it is called chronic back pain. Pain may get worse at certain times  (flare-ups). Use ice and heat as told by your doctor. Your doctor may tell you to use ice after flare-ups. This information is not intended to replace advice given to you by your health care provider. Make sure you discuss any questions you have with your health care provider. Document Revised: 06/20/2019 Document Reviewed: 06/20/2019 Elsevier Patient Education  2023 Elsevier Inc.  

## 2021-10-12 LAB — TOXASSURE SELECT 13 (MW), URINE

## 2021-11-23 DIAGNOSIS — I700 Atherosclerosis of aorta: Secondary | ICD-10-CM | POA: Diagnosis not present

## 2021-11-23 DIAGNOSIS — Z79891 Long term (current) use of opiate analgesic: Secondary | ICD-10-CM | POA: Diagnosis not present

## 2021-11-23 DIAGNOSIS — M961 Postlaminectomy syndrome, not elsewhere classified: Secondary | ICD-10-CM | POA: Diagnosis not present

## 2021-11-23 DIAGNOSIS — M5459 Other low back pain: Secondary | ICD-10-CM | POA: Diagnosis not present

## 2021-11-29 ENCOUNTER — Other Ambulatory Visit: Payer: Self-pay | Admitting: Nurse Practitioner

## 2021-11-29 DIAGNOSIS — F5101 Primary insomnia: Secondary | ICD-10-CM

## 2021-11-30 NOTE — Telephone Encounter (Signed)
Request from mail order pharmacy. Only sent 90 day supply

## 2021-12-03 DIAGNOSIS — M5416 Radiculopathy, lumbar region: Secondary | ICD-10-CM | POA: Diagnosis not present

## 2021-12-09 DIAGNOSIS — M48062 Spinal stenosis, lumbar region with neurogenic claudication: Secondary | ICD-10-CM | POA: Diagnosis not present

## 2021-12-09 DIAGNOSIS — M5416 Radiculopathy, lumbar region: Secondary | ICD-10-CM | POA: Diagnosis not present

## 2022-01-02 DIAGNOSIS — M5416 Radiculopathy, lumbar region: Secondary | ICD-10-CM | POA: Diagnosis not present

## 2022-01-21 DIAGNOSIS — M48062 Spinal stenosis, lumbar region with neurogenic claudication: Secondary | ICD-10-CM | POA: Diagnosis not present

## 2022-01-21 DIAGNOSIS — M961 Postlaminectomy syndrome, not elsewhere classified: Secondary | ICD-10-CM | POA: Diagnosis not present

## 2022-01-21 DIAGNOSIS — M5416 Radiculopathy, lumbar region: Secondary | ICD-10-CM | POA: Diagnosis not present

## 2022-01-21 DIAGNOSIS — M5459 Other low back pain: Secondary | ICD-10-CM | POA: Diagnosis not present

## 2022-02-01 ENCOUNTER — Ambulatory Visit: Payer: Medicare HMO | Admitting: Nurse Practitioner

## 2022-02-13 ENCOUNTER — Telehealth: Payer: Self-pay | Admitting: Nurse Practitioner

## 2022-02-13 DIAGNOSIS — I10 Essential (primary) hypertension: Secondary | ICD-10-CM

## 2022-02-15 ENCOUNTER — Encounter: Payer: Self-pay | Admitting: Nurse Practitioner

## 2022-02-15 NOTE — Telephone Encounter (Signed)
MMM NTBS for 6 mos ckup. Mail order NOT sent

## 2022-02-15 NOTE — Telephone Encounter (Signed)
Pt r/c--she is no longer a pt here

## 2022-02-15 NOTE — Addendum Note (Signed)
Addended by: Antonietta Barcelona D on: 02/15/2022 11:17 AM   Modules accepted: Orders

## 2022-02-15 NOTE — Telephone Encounter (Signed)
LMTCB TO SCHEDULE APPT LETTER MAILED 

## 2022-02-26 ENCOUNTER — Other Ambulatory Visit (HOSPITAL_COMMUNITY): Payer: Self-pay | Admitting: Orthopedic Surgery

## 2022-02-26 DIAGNOSIS — I714 Abdominal aortic aneurysm, without rupture, unspecified: Secondary | ICD-10-CM

## 2022-03-01 ENCOUNTER — Ambulatory Visit (HOSPITAL_COMMUNITY): Payer: Medicare HMO

## 2022-03-01 ENCOUNTER — Ambulatory Visit (HOSPITAL_COMMUNITY): Admission: RE | Admit: 2022-03-01 | Payer: Medicare HMO | Source: Ambulatory Visit

## 2022-03-05 ENCOUNTER — Other Ambulatory Visit (HOSPITAL_COMMUNITY): Payer: Self-pay | Admitting: Specialist

## 2022-03-05 ENCOUNTER — Ambulatory Visit (HOSPITAL_COMMUNITY)
Admission: RE | Admit: 2022-03-05 | Discharge: 2022-03-05 | Disposition: A | Discharge status: Home / Self Care | Payer: Medicare HMO | Source: Ambulatory Visit | Attending: Specialist | Admitting: Specialist

## 2022-03-05 ENCOUNTER — Telehealth: Payer: Self-pay

## 2022-03-05 DIAGNOSIS — M79605 Pain in left leg: Secondary | ICD-10-CM | POA: Diagnosis not present

## 2022-03-05 DIAGNOSIS — M79604 Pain in right leg: Secondary | ICD-10-CM

## 2022-03-05 NOTE — Progress Notes (Signed)
Lower extremity venous has been completed.   Preliminary results in CV Proc.   Wendy Arnold Isaly Fasching 03/05/2022 3:38 PM

## 2022-03-05 NOTE — Telephone Encounter (Signed)
Pt called stating that she has an appt to see Dr. Donnetta Hutching on 10/18, but her legs, ankles, and feet are so swollen, she is asking for advice.  Reviewed pt's chart, returned call for clarification, no answer, lf vm. Reviewed upcoming appts, instructed pt if DVT US was negative, she should use compression socks/ACE wrap. Gave instructions on how to elevate legs properly and drink plenty of water.

## 2022-03-09 ENCOUNTER — Ambulatory Visit (HOSPITAL_COMMUNITY)
Admission: RE | Admit: 2022-03-09 | Discharge: 2022-03-09 | Disposition: A | Discharge status: Home / Self Care | Payer: Medicare HMO | Source: Ambulatory Visit | Attending: Internal Medicine | Admitting: Internal Medicine

## 2022-03-09 ENCOUNTER — Ambulatory Visit (HOSPITAL_BASED_OUTPATIENT_CLINIC_OR_DEPARTMENT_OTHER)
Admission: RE | Admit: 2022-03-09 | Discharge: 2022-03-09 | Disposition: A | Discharge status: Home / Self Care | Payer: Medicare HMO | Source: Ambulatory Visit | Attending: Internal Medicine | Admitting: Internal Medicine

## 2022-03-09 DIAGNOSIS — I714 Abdominal aortic aneurysm, without rupture, unspecified: Secondary | ICD-10-CM

## 2022-03-09 DIAGNOSIS — I745 Embolism and thrombosis of iliac artery: Secondary | ICD-10-CM | POA: Diagnosis not present

## 2022-03-10 ENCOUNTER — Encounter: Payer: Self-pay | Admitting: Vascular Surgery

## 2022-03-10 ENCOUNTER — Ambulatory Visit: Payer: Medicare HMO | Admitting: Vascular Surgery

## 2022-03-10 VITALS — BP 131/82 | HR 87 | Temp 97.7°F | Ht 67.0 in | Wt 181.2 lb

## 2022-03-10 DIAGNOSIS — I7143 Infrarenal abdominal aortic aneurysm, without rupture: Secondary | ICD-10-CM

## 2022-03-10 NOTE — Progress Notes (Signed)
[facility]  Patient name: Wendy Arnold MRN: [id number] DOB: 1950/08/11 Sex: female  REASON FOR CONSULT: Evaluation new diagnosis abdominal aortic aneurysm  HPI: Wendy Arnold is a 71 y.o. female, who is today for discussion of recent diagnosis of abdominal aortic aneurysm.  She has chronic back pain.  She underwent plain films showing outline of the calcified abdominal aortic aneurysm.  She subsequently underwent ultrasound for further evaluation of this and this suggested possible iliac artery stenosis.  She does not have any claudication type symptoms.  She does have severe back chronic disability.  Her mother died of a ruptured abdominal aortic aneurysm in 21-Sep-1976.  Past Medical History:  Diagnosis Date   Anxiety    Back pain    Depression    Diverticulitis    Insomnia    Migraines     Family History  Problem Relation Age of Onset   Heart failure Mother    Cancer Father    Cancer Brother    Cancer Brother    Cancer Brother    Breast cancer Neg Hx     SOCIAL HISTORY: Social History   Socioeconomic History   Marital status: Divorced    Spouse name: Not on file   Number of children: 1   Years of education: Not on file   Highest education level: Not on file  Occupational History   Occupation: retired  Tobacco Use   Smoking status: Every Day    Packs/day: 0.25    Years: 40.00    Total pack years: 10.00    Types: Cigarettes   Smokeless tobacco: Never  Vaping Use   Vaping Use: Never used  Substance and Sexual Activity   Alcohol use: Not Currently    Comment: rarely   Drug use: No   Sexual activity: Never    Birth control/protection: Surgical  Other Topics Concern   Not on file  Social History Narrative   Her mentally disabled brother lives with her   She had one son who passed away in 2016/09/21   Social Determinants of Health   Financial Resource Strain: Low Risk  (08/27/2021)   Overall  Financial Resource Strain (CARDIA)    Difficulty of Paying Living Expenses: Not hard at all  Food Insecurity: No Food Insecurity (08/27/2021)   Hunger Vital Sign    Worried About Running Out of Food in the Last Year: Never true    Ran Out of Food in the Last Year: Never true  Transportation Needs: No Transportation Needs (08/27/2021)   PRAPARE - Administrator, Civil Service (Medical): No    Lack of Transportation (Non-Medical): No  Physical Activity: Inactive (08/27/2021)   Exercise Vital Sign    Days of Exercise per Week: 0 days    Minutes of Exercise per Session: 0 min  Stress: Stress Concern Present (08/27/2021)   [employer] of Occupational Health - Occupational Stress Questionnaire    Feeling of Stress : To some extent  Social Connections: Socially Isolated (08/27/2021)   Social Connection and Isolation Panel [NHANES]    Frequency of Communication with Friends and Family: More than three times a week    Frequency of Social Gatherings with Friends and Family: More than three times a week    Attends Religious Services: Never    Database administrator or Organizations: No    Attends Banker Meetings: Never    Marital Status: Divorced  Catering manager Violence: Not  At Risk (08/27/2021)   Humiliation, Afraid, Rape, and Kick questionnaire    Fear of Current or Ex-Partner: No    Emotionally Abused: No    Physically Abused: No    Sexually Abused: No    Allergies  Allergen Reactions   Amoxicillin-Pot Clavulanate    Flagyl [Metronidazole] Nausea And Vomiting and Other (See Comments)    Severe headaches; leg cramps.    Current Outpatient Medications  Medication Sig Dispense Refill   ALPRAZolam (XANAX) 0.5 MG tablet Take 1 tablet (0.5 mg total) by mouth 2 (two) times daily as needed for anxiety. 60 tablet 5   amLODipine-benazepril (LOTREL) 10-20 MG capsule Take 1 capsule by mouth daily. 30 capsule 0   aspirin EC 81 MG tablet Take 81 mg by mouth at bedtime.       cyclobenzaprine (FLEXERIL) 10 MG tablet Take 1 tablet (10 mg total) by mouth 2 (two) times daily as needed for muscle spasms. 60 tablet 3   ezetimibe (ZETIA) 10 MG tablet Take 1 tablet (10 mg total) by mouth daily. 90 tablet 3   gabapentin (NEURONTIN) 300 MG capsule TAKE 1 CAPSULE THREE TIMES DAILY 270 capsule 1   HYDROcodone-acetaminophen (NORCO) 10-325 MG tablet Take 1 tablet by mouth 3 (three) times daily as needed.     traZODone (DESYREL) 150 MG tablet TAKE 1 TABLET AT BEDTIME. MAY TAKE 150MG  AS PRESCRIBED 90 tablet 0   Multiple Vitamins-Minerals (ALIVE WOMENS 50+ PO) Take by mouth. (Patient not taking: Reported on 03/10/2022)     No current facility-administered medications for this visit.    REVIEW OF SYSTEMS:  [X]  denotes positive finding, [ ]  denotes negative finding Cardiac  Comments:  Chest pain or chest pressure:    Shortness of breath upon exertion:    Short of breath when lying flat:    Irregular heart rhythm:        Vascular    Pain in calf, thigh, or hip brought on by ambulation:    Pain in feet at night that wakes you up from your sleep:     Blood clot in your veins:    Leg swelling:         Pulmonary    Oxygen at home:    Productive cough:     Wheezing:         Neurologic    Sudden weakness in arms or legs:     Sudden numbness in arms or legs:     Sudden onset of difficulty speaking or slurred speech:    Temporary loss of vision in one eye:     Problems with dizziness:         Gastrointestinal    Blood in stool:     Vomited blood:         Genitourinary    Burning when urinating:     Blood in urine:        Psychiatric    Major depression:         Hematologic    Bleeding problems:    Problems with blood clotting too easily:        Skin    Rashes or ulcers:        Constitutional    Fever or chills:      PHYSICAL EXAM: Vitals:   03/10/22 0900  BP: 131/82  Pulse: 87  Temp: 97.7 F (36.5 C)  SpO2: 93%  Weight: 181 lb 3.2 oz (82.2 kg)   Height: 5\' 7"  (1.702 m)  GENERAL: The patient is a well-nourished female, in no acute distress. The vital signs are documented above. CARDIOVASCULAR: 2+ radial and 2+ dorsalis pedis pulses bilaterally.  I do not palpate an aneurysm PULMONARY: There is good air exchange  MUSCULOSKELETAL: There are no major deformities or cyanosis. NEUROLOGIC: No focal weakness or paresthesias are detected. SKIN: There are no ulcers or rashes noted. PSYCHIATRIC: The patient has a normal affect.  DATA:  Ultrasound from 03/09/2022 was reviewed with the patient.  This reveals maximal diameter of 2.9 cm.  There was suggestion of possible common iliac artery stenosis with this study.  Lower extremity noninvasive studies revealed normal triphasic waveforms and normal ankle arm index bilaterally.  MEDICAL ISSUES: I long discussion with the patient regarding the significance of a very small aneurysm.  I explained that this poses essentially no risk for rupture.  I have recommended that we see her again in 2 years with repeat ultrasound to rule out any enlargement.  Demonstrated patient aneurysm and explained the indication for repair would be rapid expansion or maximal diameter approaching 5 cm.  We will see her again in 2 years for continued follow-up   Larina Earthly, MD [facility] Tel [phone] Pager [phone]  Note: Portions of this report may have been transcribed using voice recognition software.  Every effort has been made to ensure accuracy; however, inadvertent computerized transcription errors may still be present.

## 2022-05-06 ENCOUNTER — Other Ambulatory Visit (HOSPITAL_COMMUNITY): Payer: Self-pay | Admitting: Family Medicine

## 2022-05-06 DIAGNOSIS — Z720 Tobacco use: Secondary | ICD-10-CM

## 2022-05-06 DIAGNOSIS — Z122 Encounter for screening for malignant neoplasm of respiratory organs: Secondary | ICD-10-CM

## 2022-05-21 ENCOUNTER — Ambulatory Visit (HOSPITAL_COMMUNITY)
Admission: RE | Admit: 2022-05-21 | Discharge: 2022-05-21 | Disposition: A | Discharge status: Home / Self Care | Payer: Medicare HMO | Source: Ambulatory Visit | Attending: Family Medicine | Admitting: Family Medicine

## 2022-05-21 ENCOUNTER — Other Ambulatory Visit (HOSPITAL_COMMUNITY): Payer: Self-pay | Admitting: Family Medicine

## 2022-05-21 DIAGNOSIS — I700 Atherosclerosis of aorta: Secondary | ICD-10-CM | POA: Diagnosis not present

## 2022-05-21 DIAGNOSIS — I2510 Atherosclerotic heart disease of native coronary artery without angina pectoris: Secondary | ICD-10-CM | POA: Diagnosis not present

## 2022-05-21 DIAGNOSIS — Z720 Tobacco use: Secondary | ICD-10-CM

## 2022-05-21 DIAGNOSIS — Z1231 Encounter for screening mammogram for malignant neoplasm of breast: Secondary | ICD-10-CM

## 2022-05-21 DIAGNOSIS — J439 Emphysema, unspecified: Secondary | ICD-10-CM | POA: Insufficient documentation

## 2022-05-21 DIAGNOSIS — Z122 Encounter for screening for malignant neoplasm of respiratory organs: Secondary | ICD-10-CM | POA: Diagnosis present

## 2022-05-21 DIAGNOSIS — F17210 Nicotine dependence, cigarettes, uncomplicated: Secondary | ICD-10-CM | POA: Diagnosis not present

## 2022-05-26 ENCOUNTER — Other Ambulatory Visit: Payer: Self-pay | Admitting: Nurse Practitioner

## 2022-05-26 DIAGNOSIS — G8929 Other chronic pain: Secondary | ICD-10-CM

## 2022-06-29 ENCOUNTER — Other Ambulatory Visit (HOSPITAL_COMMUNITY): Payer: Self-pay | Admitting: Family Medicine

## 2022-06-29 DIAGNOSIS — R932 Abnormal findings on diagnostic imaging of liver and biliary tract: Secondary | ICD-10-CM

## 2022-07-09 ENCOUNTER — Encounter (HOSPITAL_COMMUNITY): Payer: Self-pay

## 2022-07-09 ENCOUNTER — Ambulatory Visit (HOSPITAL_COMMUNITY): Payer: Medicare HMO

## 2022-08-22 IMAGING — DX DG CHEST 2V
2 series · 2 of 2 positions shown · non-contrast
Comparison: 09/25/2011

CLINICAL DATA: Shortness of breath

EXAM:
CHEST - 2 VIEW

[chest pa]
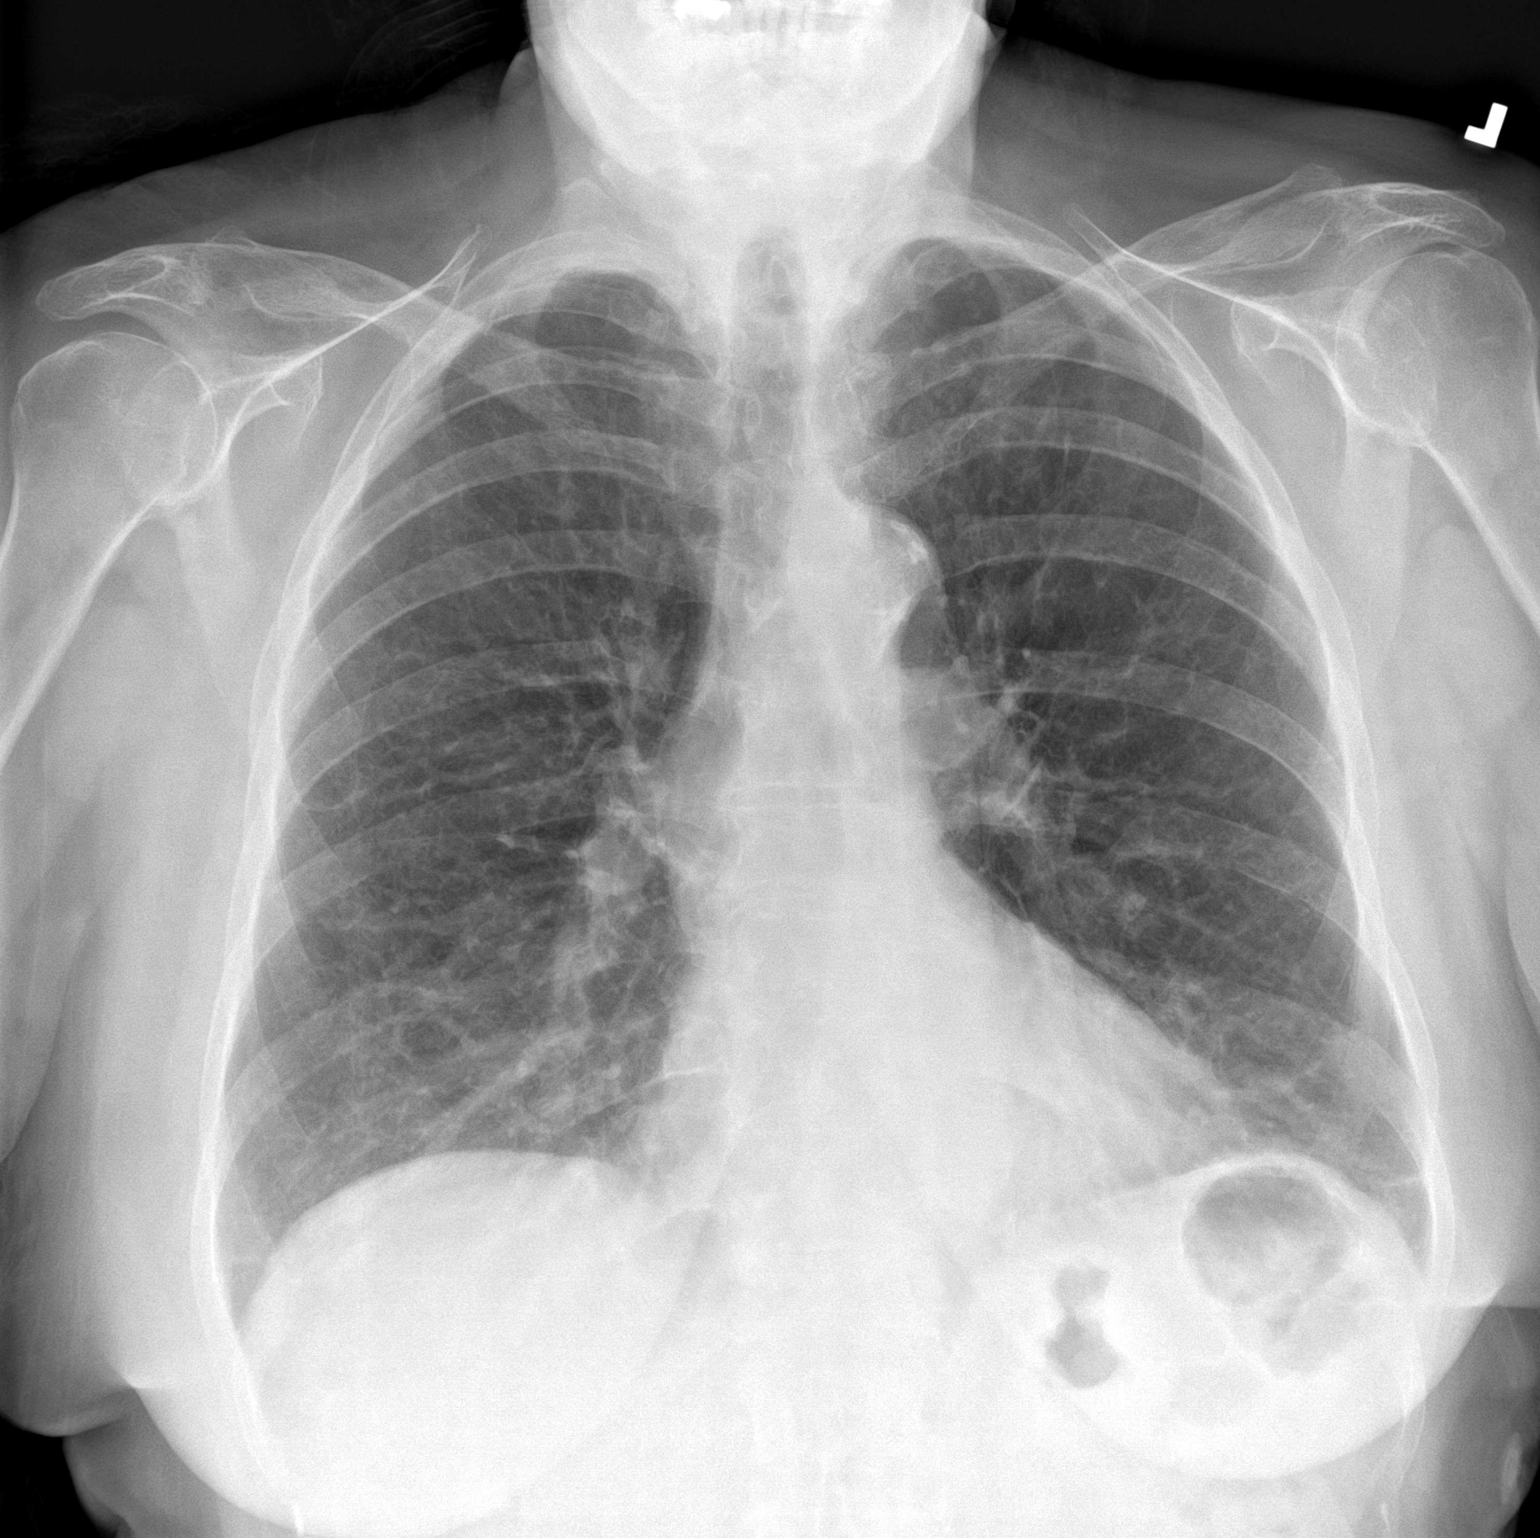

[chest lat]
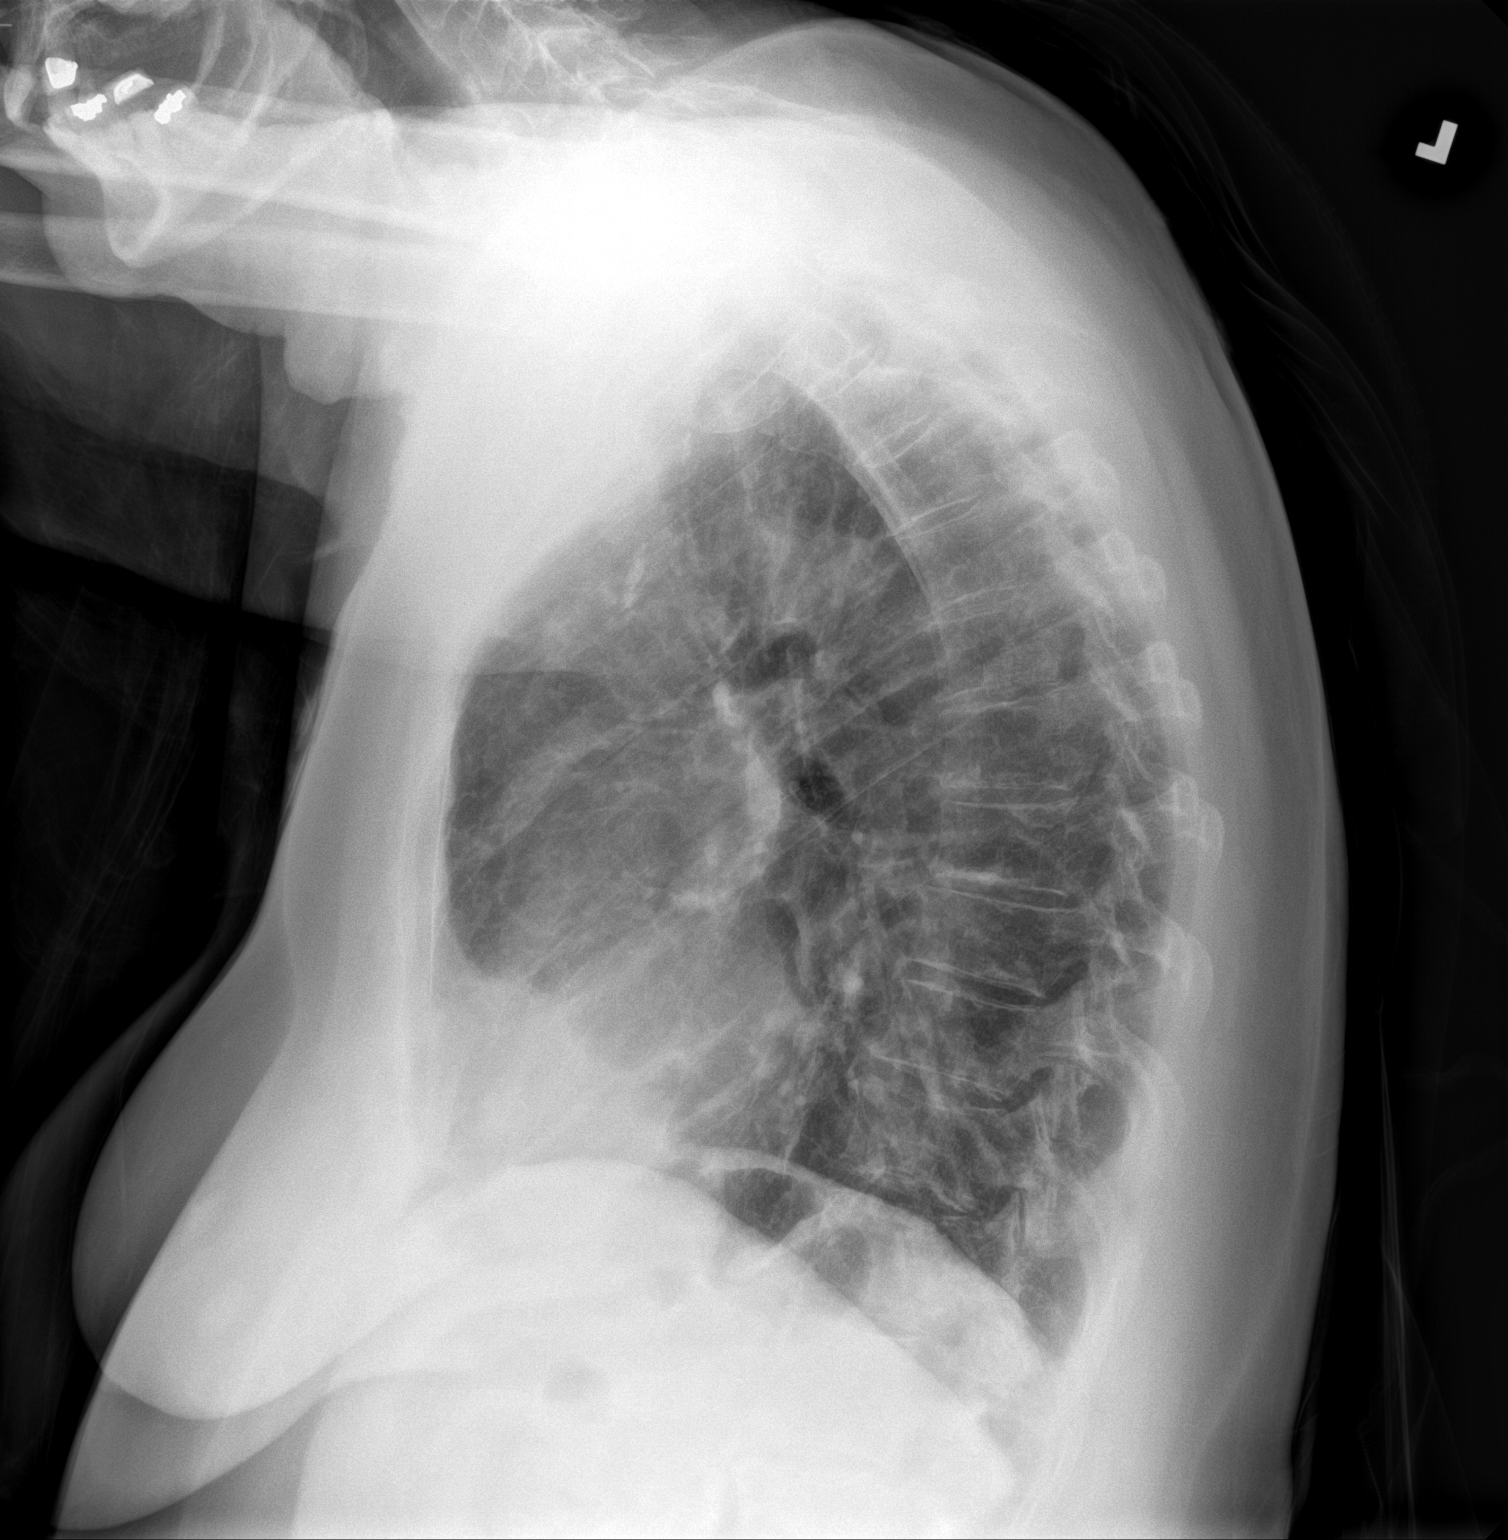

[2 of 2 positions shown; findings below may reference images not displayed]

FINDINGS: The heart size and mediastinal contours are within normal limits.
Aortic atherosclerosis. Minimal streaky left basilar opacity. Lungs
appear otherwise clear. No pleural effusion or pneumothorax. The
visualized skeletal structures are unremarkable.
IMPRESSION: Minimal streaky left basilar opacity, likely atelectasis. Lungs
appear otherwise clear.

## 2022-08-26 ENCOUNTER — Ambulatory Visit: Payer: Self-pay | Admitting: Orthopedic Surgery

## 2022-08-26 DIAGNOSIS — M48062 Spinal stenosis, lumbar region with neurogenic claudication: Secondary | ICD-10-CM

## 2022-09-17 ENCOUNTER — Ambulatory Visit: Payer: Self-pay | Admitting: Orthopedic Surgery

## 2022-09-17 NOTE — H&P (View-Only) (Signed)
Wendy Arnold is an 72 y.o. female.   Chief Complaint: back and leg pain HPI: Reason for Visit: (normal) visit for: (back) Location (Lower Extremity): lower back pain on the left; left hip and buttock Severity: pain level 7/10 Aggravating Factors: standing for long periods of time; walking for long periods of time; sitting for long periods of time Associated Symptoms: numbness/tingling; weakness (LLE) Medications: Percocet and Flexeril prescribed by Sara Whittemore, PA-C  Past Medical History:  Diagnosis Date   Anxiety    Back pain    Depression    Diverticulitis    Insomnia    Migraines     Past Surgical History:  Procedure Laterality Date   ABDOMINAL HYSTERECTOMY     BACK SURGERY     SINUS ENDO WITH FUSION     TOE SURGERY      Family History  Problem Relation Age of Onset   Heart failure Mother    Cancer Father    Cancer Brother    Cancer Brother    Cancer Brother    Breast cancer Neg Hx    Social History:  reports that she has been smoking cigarettes. She has a 10.00 pack-year smoking history. She has never used smokeless tobacco. She reports that she does not currently use alcohol. She reports that she does not use drugs.  Allergies:  Allergies  Allergen Reactions   Amoxicillin-Pot Clavulanate    Flagyl [Metronidazole] Nausea And Vomiting and Other (See Comments)    Severe headaches; leg cramps.   Current meds: ALPRAZolam 0.5 mg tablet amLODIPine 10 mg-benazepriL 20 mg capsule aspirin cyclobenzaprine 10 mg tablet ezetimibe 10 mg tablet gabapentin 300 mg capsule Narcan 4 mg/actuation nasal spray Percocet 10 mg-325 mg tablet traZODone 150 mg tablet  Review of Systems  Constitutional: Negative.   HENT: Negative.    Eyes: Negative.   Respiratory: Negative.    Cardiovascular: Negative.   Gastrointestinal: Negative.   Endocrine: Negative.   Genitourinary: Negative.   Musculoskeletal:  Positive for back pain and gait problem.  Neurological:  Positive  for weakness and numbness.  Psychiatric/Behavioral: Negative.      There were no vitals taken for this visit. Physical Exam Constitutional:      Appearance: Normal appearance.  HENT:     Head: Normocephalic and atraumatic.     Right Ear: External ear normal.     Left Ear: External ear normal.     Nose: Nose normal.     Mouth/Throat:     Pharynx: Oropharynx is clear.  Eyes:     Conjunctiva/sclera: Conjunctivae normal.  Cardiovascular:     Rate and Rhythm: Normal rate and regular rhythm.     Pulses: Normal pulses.  Pulmonary:     Effort: Pulmonary effort is normal.  Abdominal:     General: Bowel sounds are normal.  Musculoskeletal:     Cervical back: Normal range of motion.     Comments: Straight leg raise buttock thigh and calf pain left negative on the right she still has trace EHL weakness left compared to the right.  Neurological:     Mental Status: She is alert.    Three-view radiographs lumbar spine demonstrates mild multilevel spondylosis. Congenitally short pedicles. Calcification of the aorta with a apparent outline of a 3.3 cm aneurysm.  MRI 7/13 demonstrates moderately severe lateral recess stenosis at L3-4 on the left congenitally short pedicles. Severe lateral recess stenosis L4-5 on the left. Effacement of the L5 nerve root. Foraminal stenosis noted at L4 and   L5. Previous hemilaminectomy L5-S1 on the left with neuroforaminal stenosis L5 on the left.  Assessment/Plan Impression:  1. Chronic L4-L5 radiculopathy secondary lateral recess stenosis L3-4 L4-5. Component of foraminal stenosis predominately at L4-5 left 2. History of lumbar decompression L5-S1 left 3. Incidental small aortic aneurysm possible peripheral vascular disease seen by vascular surgery they will see her in 2 years and follow-up 4. Tobacco dependence improving committed to cessation close. 5. Poor dentition status post removal of digitation  Plan:  I discussed options living with her symptoms  versus consideration of a decompression L3-4 L4-5. With foraminotomies and possible hemilaminectomy of L5 on the left. I did indicate that given the duration of her symptoms she may not have relief of her symptomatology. And that it may require a fusion interbody especially at L5-S1 at some point in time. She is not interested in the fusion at this point.  She occasionally smokes and I have indicated to her she has to quit prior to surgery she indicated that she would. She is almost quit at this point in time. She has had all her teeth removed.  I recommend that she see her dentist for clearance for surgery to rule out any infection within the teeth or gum region.  Refer to vascular surgery for evaluation and further imaging of her probable aortic aneurysm. She reported her mother passed from an aortic aneurysm Would also be helpful for her to have ABIs.  Preoperative clearance from her medical physician. No history of DVT or MRSA.  I had an extensive discussion with the patient concerning the pathology relevant anatomy and treatment options. At this point exhausting conservative treatment and in the presence of a neurologic deficit we discussed microlumbar decompression. I discussed the risks and benefits including bleeding, infection, DVT, PE, anesthetic complications, worsening in their symptoms, improvement in their symptoms, C SF leakage, epidural fibrosis, need for future surgeries such as revision discectomy and lumbar fusion. I also indicated that this is an operation to basically decompress the nerve roots to allow recovery as opposed to fixing a herniated disc if it is encountered and that the incidence of recurrent chest disc herniation can approach 15%. Also that nerve root recovery is variable and may not recover completely. Any ligament or bone that is contributing to compressing the nerves will be removed as well.  I discussed the operative course including overnight in the hospital.  Immediate ambulation. Follow-up in 2 weeks for suture removal. 6 weeks until healing of the herniation and surgical incision followed by 6 weeks of reconditioning and strengthening of the core musculature. Also discussed the need to employ the concepts of disc pressure management and core motion following the surgery to minimize the risk of recurrent disc herniation. We will obtain preoperative clearance i if necessary and proceed accordingly.  Other option would be to continue conservative treatment with pain management she would like to proceed with surgery  Plan hemilaminectomy L3-4, L4-5 left  Maricruz Lucero M Sevin Farone, PA-C for Dr Beane 09/17/2022, 8:22 AM    

## 2022-09-17 NOTE — H&P (Signed)
Wendy Arnold is an 72 y.o. female.   Chief Complaint: back and leg pain HPI: Reason for Visit: (normal) visit for: (back) Location (Lower Extremity): lower back pain on the left; left hip and buttock Severity: pain level 7/10 Aggravating Factors: standing for long periods of time; walking for long periods of time; sitting for long periods of time Associated Symptoms: numbness/tingling; weakness (LLE) Medications: Percocet and Flexeril prescribed by Leta Speller, PA-C  Past Medical History:  Diagnosis Date   Anxiety    Back pain    Depression    Diverticulitis    Insomnia    Migraines     Past Surgical History:  Procedure Laterality Date   ABDOMINAL HYSTERECTOMY     BACK SURGERY     SINUS ENDO WITH FUSION     TOE SURGERY      Family History  Problem Relation Age of Onset   Heart failure Mother    Cancer Father    Cancer Brother    Cancer Brother    Cancer Brother    Breast cancer Neg Hx    Social History:  reports that she has been smoking cigarettes. She has a 10.00 pack-year smoking history. She has never used smokeless tobacco. She reports that she does not currently use alcohol. She reports that she does not use drugs.  Allergies:  Allergies  Allergen Reactions   Amoxicillin-Pot Clavulanate    Flagyl [Metronidazole] Nausea And Vomiting and Other (See Comments)    Severe headaches; leg cramps.   Current meds: ALPRAZolam 0.5 mg tablet amLODIPine 10 mg-benazepriL 20 mg capsule aspirin cyclobenzaprine 10 mg tablet ezetimibe 10 mg tablet gabapentin 300 mg capsule Narcan 4 mg/actuation nasal spray Percocet 10 mg-325 mg tablet traZODone 150 mg tablet  Review of Systems  Constitutional: Negative.   HENT: Negative.    Eyes: Negative.   Respiratory: Negative.    Cardiovascular: Negative.   Gastrointestinal: Negative.   Endocrine: Negative.   Genitourinary: Negative.   Musculoskeletal:  Positive for back pain and gait problem.  Neurological:  Positive  for weakness and numbness.  Psychiatric/Behavioral: Negative.      There were no vitals taken for this visit. Physical Exam Constitutional:      Appearance: Normal appearance.  HENT:     Head: Normocephalic and atraumatic.     Right Ear: External ear normal.     Left Ear: External ear normal.     Nose: Nose normal.     Mouth/Throat:     Pharynx: Oropharynx is clear.  Eyes:     Conjunctiva/sclera: Conjunctivae normal.  Cardiovascular:     Rate and Rhythm: Normal rate and regular rhythm.     Pulses: Normal pulses.  Pulmonary:     Effort: Pulmonary effort is normal.  Abdominal:     General: Bowel sounds are normal.  Musculoskeletal:     Cervical back: Normal range of motion.     Comments: Straight leg raise buttock thigh and calf pain left negative on the right she still has trace EHL weakness left compared to the right.  Neurological:     Mental Status: She is alert.    Three-view radiographs lumbar spine demonstrates mild multilevel spondylosis. Congenitally short pedicles. Calcification of the aorta with a apparent outline of a 3.3 cm aneurysm.  MRI 7/13 demonstrates moderately severe lateral recess stenosis at L3-4 on the left congenitally short pedicles. Severe lateral recess stenosis L4-5 on the left. Effacement of the L5 nerve root. Foraminal stenosis noted at L4 and  L5. Previous hemilaminectomy L5-S1 on the left with neuroforaminal stenosis L5 on the left.  Assessment/Plan Impression:  1. Chronic L4-L5 radiculopathy secondary lateral recess stenosis L3-4 L4-5. Component of foraminal stenosis predominately at L4-5 left 2. History of lumbar decompression L5-S1 left 3. Incidental small aortic aneurysm possible peripheral vascular disease seen by vascular surgery they will see her in 2 years and follow-up 4. Tobacco dependence improving committed to cessation close. 5. Poor dentition status post removal of digitation  Plan:  I discussed options living with her symptoms  versus consideration of a decompression L3-4 L4-5. With foraminotomies and possible hemilaminectomy of L5 on the left. I did indicate that given the duration of her symptoms she may not have relief of her symptomatology. And that it may require a fusion interbody especially at L5-S1 at some point in time. She is not interested in the fusion at this point.  She occasionally smokes and I have indicated to her she has to quit prior to surgery she indicated that she would. She is almost quit at this point in time. She has had all her teeth removed.  I recommend that she see her dentist for clearance for surgery to rule out any infection within the teeth or gum region.  Refer to vascular surgery for evaluation and further imaging of her probable aortic aneurysm. She reported her mother passed from an aortic aneurysm Would also be helpful for her to have ABIs.  Preoperative clearance from her medical physician. No history of DVT or MRSA.  I had an extensive discussion with the patient concerning the pathology relevant anatomy and treatment options. At this point exhausting conservative treatment and in the presence of a neurologic deficit we discussed microlumbar decompression. I discussed the risks and benefits including bleeding, infection, DVT, PE, anesthetic complications, worsening in their symptoms, improvement in their symptoms, C SF leakage, epidural fibrosis, need for future surgeries such as revision discectomy and lumbar fusion. I also indicated that this is an operation to basically decompress the nerve roots to allow recovery as opposed to fixing a herniated disc if it is encountered and that the incidence of recurrent chest disc herniation can approach 15%. Also that nerve root recovery is variable and may not recover completely. Any ligament or bone that is contributing to compressing the nerves will be removed as well.  I discussed the operative course including overnight in the hospital.  Immediate ambulation. Follow-up in 2 weeks for suture removal. 6 weeks until healing of the herniation and surgical incision followed by 6 weeks of reconditioning and strengthening of the core musculature. Also discussed the need to employ the concepts of disc pressure management and core motion following the surgery to minimize the risk of recurrent disc herniation. We will obtain preoperative clearance i if necessary and proceed accordingly.  Other option would be to continue conservative treatment with pain management she would like to proceed with surgery  Plan hemilaminectomy L3-4, L4-5 left  Dorothy Spark, PA-C for Dr Shelle Iron 09/17/2022, 8:22 AM

## 2022-09-22 ENCOUNTER — Encounter (HOSPITAL_COMMUNITY): Payer: Self-pay

## 2022-09-22 NOTE — Pre-Procedure Instructions (Signed)
Surgical Instructions    Your procedure is scheduled on Thursday, May 9th.  Report to [facility] Main Entrance "A" at 05:30 A.M., then check in with the Admitting office.  Call this number if you have problems the morning of surgery:  [phone]  If you have any questions prior to your surgery date call [phone]: Open Monday-Friday 8am-4pm If you experience any cold or flu symptoms such as cough, fever, chills, shortness of breath, etc. between now and your scheduled surgery, please notify us at the above number.     Remember:  Do not eat after midnight the night before your surgery  You may drink clear liquids until 04:30 AM the morning of your surgery.   Clear liquids allowed are: Water, Non-Citrus Juices (without pulp), Carbonated Beverages, Clear Tea, Black Coffee Only (NO MILK, CREAM OR POWDERED CREAMER of any kind), and Gatorade.   Patient Instructions  The night before surgery:  No food after midnight. ONLY clear liquids after midnight  The day of surgery (if you do NOT have diabetes):  Drink ONE (1) Pre-Surgery Clear Ensure by 04:30 AM the morning of surgery. Drink in one sitting. Do not sip.  This drink was given to you during your hospital  pre-op appointment visit.  Nothing else to drink after completing the  Pre-Surgery Clear Ensure.      Take these medicines the morning of surgery with A SIP OF WATER  amLODipine-benazepril (LOTREL)  ezetimibe (ZETIA)  gabapentin (NEURONTIN)   If needed: ALPRAZolam (XANAX)  cyclobenzaprine (FLEXERIL)  HYDROcodone-acetaminophen (NORCO)    Follow your surgeon's instructions on when to stop Aspirin.  If no instructions were given by your surgeon then you will need to call the office to get those instructions.     As of today, STOP taking any Aleve, Naproxen, Ibuprofen, Motrin, Advil, Goody's, BC's, all herbal medications, fish oil, and all vitamins.                     Do NOT Smoke (Tobacco/Vaping) for 24 hours prior  to your procedure.  If you use a CPAP at night, you may bring your mask/headgear for your overnight stay.   Contacts, glasses, piercing's, hearing aid's, dentures or partials may not be worn into surgery, please bring cases for these belongings.    For patients admitted to the hospital, discharge time will be determined by your treatment team.   Patients discharged the day of surgery will not be allowed to drive home, and someone needs to stay with them for 24 hours.  SURGICAL WAITING ROOM VISITATION Patients having surgery or a procedure may have no more than 2 support people in the waiting area - these visitors may rotate.   Children under the age of 98 must have an adult with them who is not the patient. If the patient needs to stay at the hospital during part of their recovery, the visitor guidelines for inpatient rooms apply. Pre-op nurse will coordinate an appropriate time for 1 support person to accompany patient in pre-op.  This support person may not rotate.   Please refer to the [facility] website for the visitor guidelines for Inpatients (after your surgery is over and you are in a regular room).     Day of Surgery: Take a shower with CHG soap. Do not wear jewelry or makeup Do not wear lotions, powders, perfumes, or deodorant. Do not bring valuables to the hospital. [facility] is not responsible for any belongings or valuables. Do not  wear nail polish, gel polish, artificial nails, or any other type of covering on natural nails (fingers and toes) If you have artificial nails or gel coating that need to be removed by a nail salon, please have this removed prior to surgery. Artificial nails or gel coating may interfere with anesthesia's ability to adequately monitor your vital signs. Wear Clean/Comfortable clothing the morning of surgery Remember to brush your teeth WITH YOUR REGULAR TOOTHPASTE.   Please read over the following fact sheets that you were given.    If you  received a COVID test during your pre-op visit  it is requested that you wear a mask when out in public, stay away from anyone that may not be feeling well and notify your surgeon if you develop symptoms. If you have been in contact with anyone that has tested positive in the last 10 days please notify you surgeon.

## 2022-09-23 ENCOUNTER — Inpatient Hospital Stay (HOSPITAL_COMMUNITY)
Admission: RE | Admit: 2022-09-23 | Discharge: 2022-09-23 | Disposition: A | Discharge status: Home / Self Care | Payer: Medicare HMO | Source: Ambulatory Visit

## 2022-09-23 HISTORY — DX: Essential (primary) hypertension: I10

## 2022-09-23 NOTE — Progress Notes (Signed)
Pt states she has pneumonia and forgot about her PAT appt. Pt advised to call Dondra Spry and reschedule

## 2022-09-29 ENCOUNTER — Encounter (HOSPITAL_COMMUNITY): Payer: Self-pay

## 2022-09-29 ENCOUNTER — Encounter (HOSPITAL_COMMUNITY)
Admission: RE | Admit: 2022-09-29 | Discharge: 2022-09-29 | Disposition: A | Discharge status: Home / Self Care | Payer: Medicare HMO | Source: Ambulatory Visit | Attending: Specialist | Admitting: Specialist

## 2022-09-29 ENCOUNTER — Ambulatory Visit (HOSPITAL_COMMUNITY)
Admission: RE | Admit: 2022-09-29 | Discharge: 2022-09-29 | Disposition: A | Discharge status: Home / Self Care | Payer: Medicare HMO | Source: Ambulatory Visit | Attending: Orthopedic Surgery | Admitting: Orthopedic Surgery

## 2022-09-29 ENCOUNTER — Other Ambulatory Visit: Payer: Self-pay

## 2022-09-29 VITALS — BP 145/79 | HR 84 | Temp 98.5°F | Resp 18 | Ht 67.0 in | Wt 163.7 lb

## 2022-09-29 DIAGNOSIS — M48062 Spinal stenosis, lumbar region with neurogenic claudication: Secondary | ICD-10-CM | POA: Diagnosis present

## 2022-09-29 DIAGNOSIS — I10 Essential (primary) hypertension: Secondary | ICD-10-CM | POA: Insufficient documentation

## 2022-09-29 DIAGNOSIS — Z01818 Encounter for other preprocedural examination: Secondary | ICD-10-CM | POA: Diagnosis not present

## 2022-09-29 HISTORY — DX: Other specified postprocedural states: Z98.890

## 2022-09-29 HISTORY — DX: Pneumonia, unspecified organism: J18.9

## 2022-09-29 LAB — CBC
HCT: 43.1 % (ref 36.0–46.0)
Hemoglobin: 13.7 g/dL (ref 12.0–15.0)
MCH: 31.4 pg (ref 26.0–34.0)
MCHC: 31.8 g/dL (ref 30.0–36.0)
MCV: 98.9 fL (ref 80.0–100.0)
Platelets: 393 10*3/uL (ref 150–400)
RBC: 4.36 MIL/uL (ref 3.87–5.11)
RDW: 14.1 % (ref 11.5–15.5)
WBC: 8.6 10*3/uL (ref 4.0–10.5)
nRBC: 0 % (ref 0.0–0.2)

## 2022-09-29 LAB — SURGICAL PCR SCREEN
MRSA, PCR: NEGATIVE
Staphylococcus aureus: NEGATIVE

## 2022-09-29 LAB — BASIC METABOLIC PANEL
Anion gap: 7 (ref 5–15)
BUN: 19 mg/dL (ref 8–23)
CO2: 27 mmol/L (ref 22–32)
Calcium: 9.2 mg/dL (ref 8.9–10.3)
Chloride: 99 mmol/L (ref 98–111)
Creatinine, Ser: 1.17 mg/dL — ABNORMAL HIGH (ref 0.44–1.00)
GFR, Estimated: 51 mL/min — ABNORMAL LOW (ref >60)
Glucose, Bld: 113 mg/dL — ABNORMAL HIGH (ref 70–99)
Potassium: 4.6 mmol/L (ref 3.5–5.1)
Sodium: 133 mmol/L — ABNORMAL LOW (ref 135–145)

## 2022-09-29 NOTE — Progress Notes (Signed)
PCP - Dr. Adelene Amas Cardiologist - denies  PPM/ICD - denies  Chest x-ray - 05/18/21 EKG - 02/03/22- tracing requested Stress Test - denies ECHO - denies Cardiac Cath - denies  Sleep Study - denies   DM- denies  Blood Thinner Instructions: n/a Aspirin Instructions: Hold 7 days. Last dose 5/1  ERAS Protcol - yes PRE-SURGERY Ensure given at PAT  COVID TEST- n/a   Anesthesia review: no  Patient denies shortness of breath, fever, cough and chest pain at PAT appointment   All instructions explained to the patient, with a verbal understanding of the material. Patient agrees to go over the instructions while at home for a better understanding. The opportunity to ask questions was provided.

## 2022-09-30 ENCOUNTER — Other Ambulatory Visit: Payer: Self-pay

## 2022-09-30 ENCOUNTER — Ambulatory Visit (HOSPITAL_COMMUNITY): Payer: Medicare HMO | Admitting: Anesthesiology

## 2022-09-30 ENCOUNTER — Encounter (HOSPITAL_COMMUNITY)
Admission: RE | Disposition: A | Discharge status: Home / Self Care | Payer: Self-pay | Source: Home / Self Care | Attending: Specialist

## 2022-09-30 ENCOUNTER — Ambulatory Visit (HOSPITAL_BASED_OUTPATIENT_CLINIC_OR_DEPARTMENT_OTHER): Payer: Medicare HMO | Admitting: Anesthesiology

## 2022-09-30 ENCOUNTER — Encounter (HOSPITAL_COMMUNITY): Payer: Self-pay | Admitting: Specialist

## 2022-09-30 ENCOUNTER — Ambulatory Visit (HOSPITAL_COMMUNITY): Payer: Medicare HMO

## 2022-09-30 ENCOUNTER — Ambulatory Visit (HOSPITAL_COMMUNITY)
Admission: RE | Admit: 2022-09-30 | Discharge: 2022-10-01 | Disposition: A | Discharge status: Home / Self Care | Payer: Medicare HMO | Attending: Specialist | Admitting: Specialist

## 2022-09-30 DIAGNOSIS — J449 Chronic obstructive pulmonary disease, unspecified: Secondary | ICD-10-CM | POA: Diagnosis not present

## 2022-09-30 DIAGNOSIS — M48061 Spinal stenosis, lumbar region without neurogenic claudication: Secondary | ICD-10-CM | POA: Diagnosis present

## 2022-09-30 DIAGNOSIS — F17210 Nicotine dependence, cigarettes, uncomplicated: Secondary | ICD-10-CM | POA: Diagnosis not present

## 2022-09-30 DIAGNOSIS — I2510 Atherosclerotic heart disease of native coronary artery without angina pectoris: Secondary | ICD-10-CM | POA: Diagnosis not present

## 2022-09-30 DIAGNOSIS — I10 Essential (primary) hypertension: Secondary | ICD-10-CM | POA: Diagnosis not present

## 2022-09-30 DIAGNOSIS — F418 Other specified anxiety disorders: Secondary | ICD-10-CM | POA: Insufficient documentation

## 2022-09-30 HISTORY — PX: LUMBAR LAMINECTOMY/DECOMPRESSION MICRODISCECTOMY: SHX5026

## 2022-09-30 SURGERY — LUMBAR LAMINECTOMY/DECOMPRESSION MICRODISCECTOMY 2 LEVELS
Anesthesia: General | Site: Back | Laterality: Left

## 2022-09-30 MED ORDER — NALOXONE HCL 4 MG/0.1ML NA LIQD
1.0000 | NASAL | Status: DC | PRN
  Filled 2022-09-30: qty 8

## 2022-09-30 MED ORDER — ALPRAZOLAM 0.5 MG PO TABS
0.5000 mg | ORAL_TABLET | Freq: Three times a day (TID) | ORAL | Status: DC | PRN
  Administered 2022-09-30 – 2022-10-01 (×3): 0.5 mg via ORAL
  Filled 2022-09-30 (×3): qty 1

## 2022-09-30 MED ORDER — ONDANSETRON HCL 4 MG/2ML IJ SOLN
INTRAMUSCULAR | Status: DC | PRN
  Administered 2022-09-30: 4 mg via INTRAVENOUS

## 2022-09-30 MED ORDER — HYDROMORPHONE HCL 1 MG/ML IJ SOLN
INTRAMUSCULAR | Status: AC
  Filled 2022-09-30: qty 1

## 2022-09-30 MED ORDER — METHOCARBAMOL 500 MG PO TABS
500.0000 mg | ORAL_TABLET | Freq: Four times a day (QID) | ORAL | Status: DC | PRN
  Administered 2022-09-30 – 2022-10-01 (×4): 500 mg via ORAL
  Filled 2022-09-30 (×4): qty 1

## 2022-09-30 MED ORDER — CHLORHEXIDINE GLUCONATE 0.12 % MT SOLN
15.0000 mL | Freq: Once | OROMUCOSAL | Status: AC
  Administered 2022-09-30: 15 mL via OROMUCOSAL
  Filled 2022-09-30: qty 15

## 2022-09-30 MED ORDER — DOCUSATE SODIUM 100 MG PO CAPS: 100.0000 mg | ORAL_CAPSULE | Freq: Two times a day (BID) | ORAL | 1 refills | Status: DC | PRN

## 2022-09-30 MED ORDER — TRAZODONE HCL 150 MG PO TABS
150.0000 mg | ORAL_TABLET | Freq: Every day | ORAL | Status: DC
  Administered 2022-09-30: 150 mg via ORAL
  Filled 2022-09-30: qty 1

## 2022-09-30 MED ORDER — PANTOPRAZOLE SODIUM 40 MG PO TBEC
40.0000 mg | DELAYED_RELEASE_TABLET | Freq: Every day | ORAL | Status: DC
  Administered 2022-09-30: 40 mg via ORAL
  Filled 2022-09-30: qty 1

## 2022-09-30 MED ORDER — HYDROMORPHONE HCL 1 MG/ML IJ SOLN: 1.0000 mg | INTRAMUSCULAR | Status: DC | PRN

## 2022-09-30 MED ORDER — MIDAZOLAM HCL 5 MG/5ML IJ SOLN
INTRAMUSCULAR | Status: DC | PRN
  Administered 2022-09-30 (×2): 1 mg via INTRAVENOUS

## 2022-09-30 MED ORDER — ROCURONIUM BROMIDE 100 MG/10ML IV SOLN
INTRAVENOUS | Status: DC | PRN
  Administered 2022-09-30: 70 mg via INTRAVENOUS

## 2022-09-30 MED ORDER — THROMBIN 20000 UNITS EX SOLR: CUTANEOUS | Status: DC | PRN

## 2022-09-30 MED ORDER — RISAQUAD PO CAPS
1.0000 | ORAL_CAPSULE | Freq: Every day | ORAL | Status: DC
  Administered 2022-09-30 – 2022-10-01 (×2): 1 via ORAL
  Filled 2022-09-30 (×2): qty 1

## 2022-09-30 MED ORDER — MAGNESIUM CITRATE PO SOLN
1.0000 | Freq: Once | ORAL | Status: DC | PRN
  Filled 2022-09-30: qty 296

## 2022-09-30 MED ORDER — HYDROMORPHONE HCL 2 MG PO TABS
2.0000 mg | ORAL_TABLET | ORAL | Status: DC | PRN
  Administered 2022-09-30 – 2022-10-01 (×6): 2 mg via ORAL
  Filled 2022-09-30 (×6): qty 1

## 2022-09-30 MED ORDER — EZETIMIBE 10 MG PO TABS
10.0000 mg | ORAL_TABLET | Freq: Every day | ORAL | Status: DC
  Administered 2022-10-01: 10 mg via ORAL
  Filled 2022-09-30: qty 1

## 2022-09-30 MED ORDER — LACTATED RINGERS IV SOLN: INTRAVENOUS | Status: DC

## 2022-09-30 MED ORDER — ACETAMINOPHEN 10 MG/ML IV SOLN
1000.0000 mg | INTRAVENOUS | Status: AC
  Administered 2022-09-30: 1000 mg via INTRAVENOUS
  Filled 2022-09-30: qty 100

## 2022-09-30 MED ORDER — BUPIVACAINE-EPINEPHRINE 0.5% -1:200000 IJ SOLN
INTRAMUSCULAR | Status: DC | PRN
  Administered 2022-09-30: 6 mL

## 2022-09-30 MED ORDER — PHENOL 1.4 % MT LIQD: 1.0000 | OROMUCOSAL | Status: DC | PRN

## 2022-09-30 MED ORDER — ALUM & MAG HYDROXIDE-SIMETH 200-200-20 MG/5ML PO SUSP: 30.0000 mL | Freq: Four times a day (QID) | ORAL | Status: DC | PRN

## 2022-09-30 MED ORDER — OXYCODONE HCL 5 MG PO TABS: 10.0000 mg | ORAL_TABLET | ORAL | Status: DC | PRN

## 2022-09-30 MED ORDER — TRANEXAMIC ACID-NACL 1000-0.7 MG/100ML-% IV SOLN
1000.0000 mg | INTRAVENOUS | Status: AC
  Administered 2022-09-30: 1000 mg via INTRAVENOUS
  Filled 2022-09-30: qty 100

## 2022-09-30 MED ORDER — PROPOFOL 10 MG/ML IV BOLUS
INTRAVENOUS | Status: DC | PRN
  Administered 2022-09-30: 140 mg via INTRAVENOUS

## 2022-09-30 MED ORDER — ACETAMINOPHEN 325 MG PO TABS: 325.0000 mg | ORAL_TABLET | Freq: Once | ORAL | Status: DC | PRN

## 2022-09-30 MED ORDER — CARBOXYMETHYLCELLUL-GLYCERIN 0.5-0.9 % OP SOLN
1.0000 [drp] | Freq: Every day | OPHTHALMIC | Status: DC | PRN
  Filled 2022-09-30: qty 15

## 2022-09-30 MED ORDER — DOCUSATE SODIUM 100 MG PO CAPS
100.0000 mg | ORAL_CAPSULE | Freq: Two times a day (BID) | ORAL | Status: DC
  Administered 2022-09-30 – 2022-10-01 (×3): 100 mg via ORAL
  Filled 2022-09-30 (×3): qty 1

## 2022-09-30 MED ORDER — TRANEXAMIC ACID 1000 MG/10ML IV SOLN
2000.0000 mg | Freq: Once | INTRAVENOUS | Status: AC
  Administered 2022-09-30: 2000 mg via TOPICAL
  Filled 2022-09-30: qty 20

## 2022-09-30 MED ORDER — BUPIVACAINE-EPINEPHRINE (PF) 0.5% -1:200000 IJ SOLN
INTRAMUSCULAR | Status: AC
  Filled 2022-09-30: qty 30

## 2022-09-30 MED ORDER — FENTANYL CITRATE (PF) 100 MCG/2ML IJ SOLN
INTRAMUSCULAR | Status: DC | PRN
  Administered 2022-09-30: 50 ug via INTRAVENOUS
  Administered 2022-09-30 (×2): 100 ug via INTRAVENOUS

## 2022-09-30 MED ORDER — MIDAZOLAM HCL 2 MG/2ML IJ SOLN
INTRAMUSCULAR | Status: AC
  Filled 2022-09-30: qty 2

## 2022-09-30 MED ORDER — PROPOFOL 10 MG/ML IV BOLUS
INTRAVENOUS | Status: AC
  Filled 2022-09-30: qty 20

## 2022-09-30 MED ORDER — HYDROMORPHONE HCL 2 MG PO TABS: 2.0000 mg | ORAL_TABLET | ORAL | 0 refills | Status: DC | PRN

## 2022-09-30 MED ORDER — LIDOCAINE 2% (20 MG/ML) 5 ML SYRINGE
INTRAMUSCULAR | Status: DC | PRN
  Administered 2022-09-30: 60 mg via INTRAVENOUS

## 2022-09-30 MED ORDER — ORAL CARE MOUTH RINSE: 15.0000 mL | Freq: Once | OROMUCOSAL | Status: AC

## 2022-09-30 MED ORDER — PHENYLEPHRINE 80 MCG/ML (10ML) SYRINGE FOR IV PUSH (FOR BLOOD PRESSURE SUPPORT)
PREFILLED_SYRINGE | INTRAVENOUS | Status: DC | PRN
  Administered 2022-09-30: 40 ug via INTRAVENOUS
  Administered 2022-09-30: 80 ug via INTRAVENOUS
  Administered 2022-09-30: 40 ug via INTRAVENOUS

## 2022-09-30 MED ORDER — ALBUTEROL SULFATE (2.5 MG/3ML) 0.083% IN NEBU: 2.5000 mg | INHALATION_SOLUTION | Freq: Four times a day (QID) | RESPIRATORY_TRACT | Status: DC | PRN

## 2022-09-30 MED ORDER — MENTHOL 3 MG MT LOZG: 1.0000 | LOZENGE | OROMUCOSAL | Status: DC | PRN

## 2022-09-30 MED ORDER — POTASSIUM CHLORIDE IN NACL 20-0.9 MEQ/L-% IV SOLN: INTRAVENOUS | Status: DC

## 2022-09-30 MED ORDER — METHOCARBAMOL 1000 MG/10ML IJ SOLN
500.0000 mg | Freq: Four times a day (QID) | INTRAVENOUS | Status: DC | PRN
  Filled 2022-09-30: qty 5

## 2022-09-30 MED ORDER — ONDANSETRON HCL 4 MG PO TABS: 4.0000 mg | ORAL_TABLET | Freq: Four times a day (QID) | ORAL | Status: DC | PRN

## 2022-09-30 MED ORDER — PANTOPRAZOLE SODIUM 40 MG IV SOLR: 40.0000 mg | Freq: Every day | INTRAVENOUS | Status: DC

## 2022-09-30 MED ORDER — ONDANSETRON HCL 4 MG/2ML IJ SOLN: 4.0000 mg | Freq: Four times a day (QID) | INTRAMUSCULAR | Status: DC | PRN

## 2022-09-30 MED ORDER — ACETAMINOPHEN 160 MG/5ML PO SOLN: 325.0000 mg | Freq: Once | ORAL | Status: DC | PRN

## 2022-09-30 MED ORDER — CEFAZOLIN SODIUM-DEXTROSE 2-4 GM/100ML-% IV SOLN
2.0000 g | Freq: Three times a day (TID) | INTRAVENOUS | Status: AC
  Administered 2022-09-30 (×2): 2 g via INTRAVENOUS
  Filled 2022-09-30 (×2): qty 100

## 2022-09-30 MED ORDER — AMISULPRIDE (ANTIEMETIC) 5 MG/2ML IV SOLN: 10.0000 mg | Freq: Once | INTRAVENOUS | Status: DC | PRN

## 2022-09-30 MED ORDER — FLUOXETINE HCL 20 MG PO CAPS
40.0000 mg | ORAL_CAPSULE | Freq: Every day | ORAL | Status: DC
  Administered 2022-10-01: 40 mg via ORAL
  Filled 2022-09-30: qty 2

## 2022-09-30 MED ORDER — BISACODYL 5 MG PO TBEC: 5.0000 mg | DELAYED_RELEASE_TABLET | Freq: Every day | ORAL | Status: DC | PRN

## 2022-09-30 MED ORDER — AMLODIPINE BESYLATE 10 MG PO TABS
10.0000 mg | ORAL_TABLET | Freq: Every day | ORAL | Status: DC
  Administered 2022-10-01: 10 mg via ORAL
  Filled 2022-09-30: qty 1

## 2022-09-30 MED ORDER — CEFAZOLIN SODIUM-DEXTROSE 2-4 GM/100ML-% IV SOLN
2.0000 g | INTRAVENOUS | Status: AC
  Administered 2022-09-30: 2 g via INTRAVENOUS
  Filled 2022-09-30: qty 100

## 2022-09-30 MED ORDER — 0.9 % SODIUM CHLORIDE (POUR BTL) OPTIME
TOPICAL | Status: DC | PRN
  Administered 2022-09-30: 1000 mL

## 2022-09-30 MED ORDER — FENTANYL CITRATE (PF) 250 MCG/5ML IJ SOLN
INTRAMUSCULAR | Status: AC
  Filled 2022-09-30: qty 5

## 2022-09-30 MED ORDER — POLYETHYLENE GLYCOL 3350 17 G PO PACK: 17.0000 g | PACK | Freq: Every day | ORAL | Status: DC | PRN

## 2022-09-30 MED ORDER — ACETAMINOPHEN 325 MG PO TABS: 650.0000 mg | ORAL_TABLET | ORAL | Status: DC | PRN

## 2022-09-30 MED ORDER — DEXAMETHASONE SODIUM PHOSPHATE 4 MG/ML IJ SOLN
INTRAMUSCULAR | Status: DC | PRN
  Administered 2022-09-30: 10 mg via INTRAVENOUS

## 2022-09-30 MED ORDER — POLYETHYLENE GLYCOL 3350 17 G PO PACK: 17.0000 g | PACK | Freq: Every day | ORAL | 0 refills | Status: DC

## 2022-09-30 MED ORDER — HYDROMORPHONE HCL 1 MG/ML IJ SOLN
0.2500 mg | INTRAMUSCULAR | Status: DC | PRN
  Administered 2022-09-30 (×4): 0.5 mg via INTRAVENOUS

## 2022-09-30 MED ORDER — ACETAMINOPHEN 650 MG RE SUPP: 650.0000 mg | RECTAL | Status: DC | PRN

## 2022-09-30 MED ORDER — THROMBIN 20000 UNITS EX SOLR
CUTANEOUS | Status: AC
  Filled 2022-09-30: qty 20000

## 2022-09-30 MED ORDER — PROMETHAZINE HCL 25 MG/ML IJ SOLN: 6.2500 mg | INTRAMUSCULAR | Status: DC | PRN

## 2022-09-30 MED ORDER — GABAPENTIN 300 MG PO CAPS
300.0000 mg | ORAL_CAPSULE | Freq: Three times a day (TID) | ORAL | Status: DC
  Administered 2022-09-30 – 2022-10-01 (×3): 300 mg via ORAL
  Filled 2022-09-30 (×3): qty 1

## 2022-09-30 MED ORDER — ACETAMINOPHEN 10 MG/ML IV SOLN: 1000.0000 mg | Freq: Once | INTRAVENOUS | Status: DC | PRN

## 2022-09-30 SURGICAL SUPPLY — 59 items
BAG COUNTER SPONGE SURGICOUNT (BAG) ×1 IMPLANT
BAG DECANTER FOR FLEXI CONT (MISCELLANEOUS) IMPLANT
BAG SPNG CNTER NS LX DISP (BAG) ×1
BAND INSRT 18 STRL LF DISP RB (MISCELLANEOUS) ×2
BAND RUBBER #18 3X1/16 STRL (MISCELLANEOUS) ×2 IMPLANT
BUR EGG ELITE 5.0 (BURR) IMPLANT
BUR RND DIAMOND ELITE 4.0 (BURR) IMPLANT
CLEANER TIP ELECTROSURG 2X2 (MISCELLANEOUS) ×1 IMPLANT
CNTNR URN SCR LID CUP LEK RST (MISCELLANEOUS) ×1 IMPLANT
CONT SPEC 4OZ STRL OR WHT (MISCELLANEOUS) ×1
DRAPE LAPAROTOMY 100X72X124 (DRAPES) ×1 IMPLANT
DRAPE MICROSCOPE SLANT 54X150 (MISCELLANEOUS) ×1 IMPLANT
DRAPE SHEET LG 3/4 BI-LAMINATE (DRAPES) ×1 IMPLANT
DRAPE SURG 17X11 SM STRL (DRAPES) ×1 IMPLANT
DRAPE UTILITY XL STRL (DRAPES) ×1 IMPLANT
DRSG AQUACEL AG ADV 3.5X 4 (GAUZE/BANDAGES/DRESSINGS) IMPLANT
DRSG AQUACEL AG ADV 3.5X 6 (GAUZE/BANDAGES/DRESSINGS) IMPLANT
DRSG TELFA 3X8 NADH STRL (GAUZE/BANDAGES/DRESSINGS) IMPLANT
DURAPREP 26ML APPLICATOR (WOUND CARE) ×1 IMPLANT
DURASEAL SPINE SEALANT 3ML (MISCELLANEOUS) IMPLANT
ELECT BLADE 4.0 EZ CLEAN MEGAD (MISCELLANEOUS) IMPLANT
ELECT REM PT RETURN 9FT ADLT (ELECTROSURGICAL) ×1 IMPLANT
ELECTRODE BLDE 4.0 EZ CLN MEGD (MISCELLANEOUS) IMPLANT
ELECTRODE REM PT RTRN 9FT ADLT (ELECTROSURGICAL) ×1 IMPLANT
GLOVE BIOGEL PI IND STRL 7.5 (GLOVE) ×1 IMPLANT
GLOVE SURG SS PI 7.0 STRL IVOR (GLOVE) ×1 IMPLANT
GLOVE SURG SS PI 8.0 STRL IVOR (GLOVE) ×2 IMPLANT
GOWN STRL REUS W/ TWL LRG LVL3 (GOWN DISPOSABLE) ×1 IMPLANT
GOWN STRL REUS W/ TWL XL LVL3 (GOWN DISPOSABLE) ×1 IMPLANT
GOWN STRL REUS W/TWL LRG LVL3 (GOWN DISPOSABLE) ×1
GOWN STRL REUS W/TWL XL LVL3 (GOWN DISPOSABLE) ×1
IV CATH 14GX2 1/4 (CATHETERS) ×1 IMPLANT
KIT BASIN OR (CUSTOM PROCEDURE TRAY) ×1 IMPLANT
KIT POSITION SURG JACKSON T1 (MISCELLANEOUS) IMPLANT
NDL 22X1.5 STRL (OR ONLY) (MISCELLANEOUS) ×1 IMPLANT
NDL SPNL 18GX3.5 QUINCKE PK (NEEDLE) ×2 IMPLANT
NEEDLE 22X1.5 STRL (OR ONLY) (MISCELLANEOUS) ×1 IMPLANT
NEEDLE SPNL 18GX3.5 QUINCKE PK (NEEDLE) ×2 IMPLANT
PACK LAMINECTOMY NEURO (CUSTOM PROCEDURE TRAY) ×1 IMPLANT
PATTIES SURGICAL .75X.75 (GAUZE/BANDAGES/DRESSINGS) ×1 IMPLANT
SOLUTION PRONTOSAN WOUND 350ML (IRRIGATION / IRRIGATOR) IMPLANT
SPONGE SURGIFOAM ABS GEL 100 (HEMOSTASIS) ×1 IMPLANT
SPONGE T-LAP 4X18 ~~LOC~~+RFID (SPONGE) IMPLANT
STAPLER VISISTAT (STAPLE) IMPLANT
STRIP CLOSURE SKIN 1/2X4 (GAUZE/BANDAGES/DRESSINGS) ×1 IMPLANT
SUT NURALON 4 0 TR CR/8 (SUTURE) IMPLANT
SUT PROLENE 3 0 PS 2 (SUTURE) IMPLANT
SUT VIC AB 1 CT1 27 (SUTURE) ×2
SUT VIC AB 1 CT1 27XBRD ANTBC (SUTURE) IMPLANT
SUT VIC AB 1-0 CT2 27 (SUTURE) IMPLANT
SUT VIC AB 2-0 CT1 27 (SUTURE)
SUT VIC AB 2-0 CT1 TAPERPNT 27 (SUTURE) IMPLANT
SUT VIC AB 2-0 CT2 27 (SUTURE) IMPLANT
SYR 3ML LL SCALE MARK (SYRINGE) ×1 IMPLANT
TOWEL GREEN STERILE (TOWEL DISPOSABLE) ×1 IMPLANT
TOWEL GREEN STERILE FF (TOWEL DISPOSABLE) ×1 IMPLANT
TRAY FOLEY MTR SLVR 16FR STAT (SET/KITS/TRAYS/PACK) ×1 IMPLANT
WIPE CHG 2% 2PK PREOPERATIVE (MISCELLANEOUS) ×1 IMPLANT
YANKAUER SUCT BULB TIP NO VENT (SUCTIONS) ×1 IMPLANT

## 2022-09-30 NOTE — Transfer of Care (Signed)
Immediate Anesthesia Transfer of Care Note  Patient: Wendy Arnold  Procedure(s) Performed: HEMI-LAMINECTOMY LUMBAR THREE-FOUR LEFT, REVISION HEMILAMINECTOMY LUMBAR FOUR-FIVE LEFT (Left: Back)  Patient Location: PACU  Anesthesia Type:General  Level of Consciousness: awake and alert   Airway & Oxygen Therapy: Patient Spontanous Breathing and Patient connected to face mask oxygen  Post-op Assessment: Report given to RN and Post -op Vital signs reviewed and stable  Post vital signs: Reviewed and stable  Last Vitals:  Vitals Value Taken Time  BP 134/82 09/30/22 1008  Temp    Pulse 82 09/30/22 1011  Resp 0 09/30/22 1011  SpO2 98 % 09/30/22 1011  Vitals shown include unvalidated device data.  Last Pain:  Vitals:   09/30/22 0630  TempSrc: Oral  PainSc:          Complications: No notable events documented.

## 2022-09-30 NOTE — Anesthesia Postprocedure Evaluation (Signed)
Anesthesia Post Note  Patient: JOVANNI MONTE  Procedure(s) Performed: HEMI-LAMINECTOMY LUMBAR THREE-FOUR LEFT, REVISION HEMILAMINECTOMY LUMBAR FOUR-FIVE LEFT (Left: Back)     Patient location during evaluation: PACU Anesthesia Type: General Level of consciousness: awake and alert Pain management: pain level controlled Vital Signs Assessment: post-procedure vital signs reviewed and stable Respiratory status: spontaneous breathing, nonlabored ventilation, respiratory function stable and patient connected to nasal cannula oxygen Cardiovascular status: blood pressure returned to baseline and stable Postop Assessment: no apparent nausea or vomiting Anesthetic complications: no  No notable events documented.  Last Vitals:  Vitals:   09/30/22 1134 09/30/22 1545  BP: (!) 165/81 (!) 143/61  Pulse: 80 81  Resp: 16 16  Temp: 36.7 C 37.1 C  SpO2: 97% 93%    Last Pain:  Vitals:   09/30/22 1545  TempSrc: Oral  PainSc:                  Shelton Silvas

## 2022-09-30 NOTE — Discharge Instructions (Signed)

## 2022-09-30 NOTE — Op Note (Signed)
NAME: Wendy Arnold, MARCHANT MEDICAL RECORD NO: [id number] ACCOUNT NO: [id number] DATE OF BIRTH: February 09, 1951 FACILITY: [facility] LOCATION: [facility] PHYSICIAN: Javier Docker, MD  Operative Report   DATE OF PROCEDURE: 09/30/2022  PREOPERATIVE DIAGNOSES:   1.  Spinal stenosis, L3-L4. 2.  Recurrent spinal stenosis, L4-L5.  PROCEDURE PERFORMED:   1.  Revision lumbar decompression with hemilaminotomies and foraminotomies L4-L5 left. 2.  Hemilaminotomy with foraminotomies L3-L4 left.  ANESTHESIA:  General.  ASSISTANT:  Andrez Grime, PA.  HISTORY:  A 72 year old female with history of lumbar decompression, has had left lower extremity radiculopathy predominantly L4-L5 nerve root distribution.  She had severe lateral recess stenosis at L4-L5, recurrent history of lumbar decompression.   Also stenosis in the lateral recess at L3-L4.  Indicated for decompression of the L4-L5 nerve roots.  Risks and benefits discussed including bleeding, infection, damage to neurovascular structures, no change in symptoms, worsening symptoms, DVT, PE,  anesthetic complications, etc.  TECHNIQUE:  The patient in supine position, after induction of adequate general anesthesia, 2 grams Kefzol, placed prone on the Wilson frame.  All bony prominences well padded.  Lumbar region prepped and draped in the usual sterile fashion.  Two 18-gauge  spinal needles utilized to localize L4-L5 interspace and L3-L4 interspace, confirmed with x-ray.  Incision was made from the spinous process above L3 to below L4.  Subcutaneous tissue was dissected.  Electrocautery was utilized to achieve hemostasis.   Dorsal lumbar fascia divided in line with skin incision.  Paraspinous muscle elevated from lamina L3-L4, L4-L5.  McCulloch retractors were placed.  The patient had some epidural scar tissue at L4-L5 with previous removal of the spinous processes of L5.   The scar tissue was mobilized from the paraspinous musculature.  A curette was utilized to  skeletonize the laminotomy at L4-L5.  Attention turned first at L3-L4.  High-speed bur was utilized to begin a hemilaminotomy at L3-L4.  This preserved the pars.   Less than 50% of the facet.  This was completed with a 2 mm and a 3 mm Kerrison.  Removing ligamentum, which was hypertrophic from the interspace.  A curette was utilized to skeletonize superior articulating process of L4.  The neural elements well  protected. I used a 2 mm Kerrison to decompress the lateral recess to the medial border of the pedicle, which was stenotic.  I performed a foraminotomy of L4.  Ligamentum flavum removed from the interspace.  There was no disk herniation.  Following the  hemilaminotomy, a Woodson probe passed freely above the pedicle of L4 and below the pedicle of L5 and out the foramen of L3.  Bone wax was placed on the cancellous surfaces.  Copiously irrigated the wounds.  I placed thrombin-soaked Gelfoam there as we  turned attention down the L4-L5.  Again, we skeletonized previous lamina of L4 and L5.  High-speed bur was utilized to remove the inferior portion of the lamina of L4, preserving the facets.  Less than 50% of the facet was removed.  I identified the  superior articulating process of L5.  This was skeletonized with a straight curette.  Then, I was able to enter the epidural space with a small straight curette protecting the neural elements.  I decompressed the lateral recess, which was severely  stenotic to the medial border of the pedicle and foraminotomy L3 was performed.  There was a small fragment of bone that was loose in the foramen, about a centimeter x half a centimeter.  This was meticulously skeletonized with  a small curette and  removed from the epidural space.  Bipolar cautery was utilized to achieve hemostasis.  Following the decompression with hypertrophic ligamentum as well which was removed, a Woodson probe passed freely above the pedicle L4, below the pedicle L5 and out  the foramen of L4  and L5.  Copiously irrigated.  Bone wax was placed on the cancellous surfaces.  I obtained a confirmatory radiograph with Woodson in the interspace at L3-L4 and L4-L5.  Gelfoam were placed and then removed.  Both copiously irrigated.   No evidence of CSF leakage or active bleeding.  Next, I removed the McCulloch retractor, irrigated the paraspinous musculature.  Bipolar cautery was utilized to achieve strict hemostasis.  I closed the dorsal lumbar fascia with 1 Vicryl in interrupted  figure-of-eight sutures, subcutaneous with 2-0 and skin with staples.  Wound was dressed sterilely. Placed supine on the hospital bed, extubated without difficulty and transported to the recovery room in satisfactory condition.  The patient tolerated the procedure well.  No complications.  Assistant:  Andrez Grime, PA.  Blood loss 25 mL. PA was utilized for patient positioning, general intermittent neural retraction, closure, etc.   VAI D: 09/30/2022 10:11:22 am T: 09/30/2022 11:11:00 am  JOB: [id number]/ [id number]

## 2022-09-30 NOTE — Anesthesia Procedure Notes (Signed)
Procedure Name: Intubation Date/Time: 09/30/2022 7:48 AM  Performed by: Caren Macadam, CRNAPre-anesthesia Checklist: Patient identified, Emergency Drugs available, Suction available and Patient being monitored Patient Re-evaluated:Patient Re-evaluated prior to induction Oxygen Delivery Method: Circle system utilized Preoxygenation: Pre-oxygenation with 100% oxygen Induction Type: IV induction Ventilation: Mask ventilation without difficulty Laryngoscope Size: Miller and 2 Grade View: Grade I Tube type: Oral Tube size: 7.0 mm Number of attempts: 1 Airway Equipment and Method: Stylet and Oral airway Placement Confirmation: ETT inserted through vocal cords under direct vision, positive ETCO2 and breath sounds checked- equal and bilateral Secured at: 22 cm Tube secured with: Tape Dental Injury: Teeth and Oropharynx as per pre-operative assessment

## 2022-09-30 NOTE — Anesthesia Preprocedure Evaluation (Addendum)
Anesthesia Evaluation  Patient identified by MRN, date of birth, ID band Patient awake    Reviewed: Allergy & Precautions, NPO status , Patient's Chart, lab work & pertinent test results  History of Anesthesia Complications (+) PONV and history of anesthetic complications  Airway Mallampati: II  TM Distance: <3 FB Neck ROM: Full    Dental  (+) Edentulous Upper, Dental Advisory Given   Pulmonary COPD, Current Smoker and Patient abstained from smoking.   breath sounds clear to auscultation       Cardiovascular hypertension, Pt. on medications + CAD   Rhythm:Regular Rate:Normal     Neuro/Psych  Headaches PSYCHIATRIC DISORDERS Anxiety Depression     Neuromuscular disease    GI/Hepatic negative GI ROS, Neg liver ROS,,,  Endo/Other  negative endocrine ROS    Renal/GU negative Renal ROS     Musculoskeletal negative musculoskeletal ROS (+)    Abdominal   Peds  Hematology negative hematology ROS (+)   Anesthesia Other Findings   Reproductive/Obstetrics                             Anesthesia Physical Anesthesia Plan  ASA: 3  Anesthesia Plan: General   Post-op Pain Management: Tylenol PO (pre-op)*   Induction: Intravenous  PONV Risk Score and Plan: 4 or greater and Ondansetron, Treatment may vary due to age or medical condition, Midazolam and Dexamethasone  Airway Management Planned: Oral ETT  Additional Equipment: None  Intra-op Plan:   Post-operative Plan: Extubation in OR  Informed Consent: I have reviewed the patients History and Physical, chart, labs and discussed the procedure including the risks, benefits and alternatives for the proposed anesthesia with the patient or authorized representative who has indicated his/her understanding and acceptance.     Dental advisory given  Plan Discussed with: CRNA  Anesthesia Plan Comments:        Anesthesia Quick Evaluation

## 2022-09-30 NOTE — Brief Op Note (Signed)
09/30/2022  7:17 AM  PATIENT:  Wendy Arnold  72 y.o. female  PRE-OPERATIVE DIAGNOSIS:  Stenosis L3-4, L4-5  POST-OPERATIVE DIAGNOSIS:  * No post-op diagnosis entered *  PROCEDURE:  Procedure(s) with comments: HEMI-LAMINECTOMY L3-4, L4-5 LEFT (Left) - 3 C-BED  SURGEON:  Surgeon(s) and Role:    Jene Every, MD - Primary  PHYSICIAN ASSISTANT:   ASSISTANTS: Bissell   ANESTHESIA:   general  EBL:  50   BLOOD ADMINISTERED:none  DRAINS: none   LOCAL MEDICATIONS USED:  MARCAINE     SPECIMEN:  No Specimen  DISPOSITION OF SPECIMEN:  N/A  COUNTS:  YES  TOURNIQUET:    DICTATION: .Other Dictation: Dictation Number   [id number]   PLAN OF CARE: Admit for overnight observation  PATIENT DISPOSITION:  PACU - hemodynamically stable.   Delay start of Pharmacological VTE agent (>24hrs) due to surgical blood loss or risk of bleeding: yes

## 2022-09-30 NOTE — Interval H&P Note (Signed)
History and Physical Interval Note:  09/30/2022 7:10 AM  Wendy Arnold  has presented today for surgery, with the diagnosis of Stenosis L3-4, L4-5.  The various methods of treatment have been discussed with the patient and family. After consideration of risks, benefits and other options for treatment, the patient has consented to  Procedure(s) with comments: HEMI-LAMINECTOMY L3-4, L4-5 LEFT (Left) - 3 C-BED as a surgical intervention.  The patient's history has been reviewed, patient examined, no change in status, stable for surgery.  I have reviewed the patient's chart and labs.  Questions were answered to the patient's satisfaction.     Javier Docker

## 2022-10-01 ENCOUNTER — Encounter (HOSPITAL_COMMUNITY): Payer: Self-pay | Admitting: Specialist

## 2022-10-01 DIAGNOSIS — M48061 Spinal stenosis, lumbar region without neurogenic claudication: Secondary | ICD-10-CM | POA: Diagnosis not present

## 2022-10-01 LAB — BASIC METABOLIC PANEL
Anion gap: 6 (ref 5–15)
BUN: 18 mg/dL (ref 8–23)
CO2: 25 mmol/L (ref 22–32)
Calcium: 9 mg/dL (ref 8.9–10.3)
Chloride: 104 mmol/L (ref 98–111)
Creatinine, Ser: 1.03 mg/dL — ABNORMAL HIGH (ref 0.44–1.00)
GFR, Estimated: 59 mL/min — ABNORMAL LOW (ref >60)
Glucose, Bld: 110 mg/dL — ABNORMAL HIGH (ref 70–99)
Potassium: 4.3 mmol/L (ref 3.5–5.1)
Sodium: 135 mmol/L (ref 135–145)

## 2022-10-01 LAB — CBC
HCT: 38.4 % (ref 36.0–46.0)
Hemoglobin: 13 g/dL (ref 12.0–15.0)
MCH: 32.3 pg (ref 26.0–34.0)
MCHC: 33.9 g/dL (ref 30.0–36.0)
MCV: 95.3 fL (ref 80.0–100.0)
Platelets: 326 10*3/uL (ref 150–400)
RBC: 4.03 MIL/uL (ref 3.87–5.11)
RDW: 13.9 % (ref 11.5–15.5)
WBC: 15.4 10*3/uL — ABNORMAL HIGH (ref 4.0–10.5)
nRBC: 0 % (ref 0.0–0.2)

## 2022-10-01 MED ORDER — DOCUSATE SODIUM 100 MG PO CAPS: 100.0000 mg | ORAL_CAPSULE | Freq: Two times a day (BID) | ORAL | 1 refills | Status: DC | PRN

## 2022-10-01 MED ORDER — HYDROMORPHONE HCL 2 MG PO TABS: 2.0000 mg | ORAL_TABLET | ORAL | 0 refills | Status: AC | PRN

## 2022-10-01 MED ORDER — POLYETHYLENE GLYCOL 3350 17 G PO PACK: 17.0000 g | PACK | Freq: Every day | ORAL | 0 refills | Status: DC

## 2022-10-01 NOTE — Evaluation (Signed)
Physical Therapy Evaluation Patient Details Name: Wendy Arnold MRN: [id number] DOB: June 04, 1950 Today's Date: 10/01/2022  History of Present Illness  72 yo female s/p L3-4 revision hemilaminectomy L4-5 5/9 PMH insomnia diverticulitis anxiety  Clinical Impression  Pt presents to PT with deficits in strength, endurance, gait, balance, power. Pt is able to ambulate for household distances with support of RW. PT provides reinforcement of education for log roll in bed to reduce twisting. PT anticipates the pt will progress well, recommends discharge home with no immediate post-acute PT follow-up.       Recommendations for follow up therapy are one component of a multi-disciplinary discharge planning process, led by the attending physician.  Recommendations may be updated based on patient status, additional functional criteria and insurance authorization.  Follow Up Recommendations       Assistance Recommended at Discharge PRN  Patient can return home with the following  A little help with bathing/dressing/bathroom;Assistance with cooking/housework;Assist for transportation;Help with stairs or ramp for entrance    Equipment Recommendations Rolling walker (2 wheels)  Recommendations for Other Services       Functional Status Assessment Patient has had a recent decline in their functional status and demonstrates the ability to make significant improvements in function in a reasonable and predictable amount of time.     Precautions / Restrictions Precautions Precautions: Back Precaution Booklet Issued: Yes (comment) Restrictions Weight Bearing Restrictions: No      Mobility  Bed Mobility Overal bed mobility: Needs Assistance Bed Mobility: Rolling, Sit to Sidelying, Supine to Sit Rolling: Supervision   Supine to sit: Supervision   Sit to sidelying: Supervision General bed mobility comments: pt sits in long sitting and then rotates to edge of bed, PT provides cues for log roll  and sidelying to sit. Pt reports pain at incision limiting ability to roll onto back. Pt performs sitting to sidelying well    Transfers Overall transfer level: Needs assistance Equipment used: Rolling walker (2 wheels) Transfers: Sit to/from Stand Sit to Stand: Supervision                Ambulation/Gait Ambulation/Gait assistance: Supervision Gait Distance (Feet): 100 Feet Assistive device: Rolling walker (2 wheels) Gait Pattern/deviations: Step-through pattern Gait velocity: reduced Gait velocity interpretation: <1.8 ft/sec, indicate of risk for recurrent falls   General Gait Details: slowed step-through gait, cues for step-through pattern and cues for posture  Stairs Stairs:  (pt declines need for stair training, performed with OT prior to PT arrival)          Wheelchair Mobility    Modified Rankin (Stroke Patients Only)       Balance Overall balance assessment: Needs assistance Sitting-balance support: No upper extremity supported, Feet supported Sitting balance-Leahy Scale: Good     Standing balance support: Single extremity supported, Reliant on assistive device for balance Standing balance-Leahy Scale: Poor                               Pertinent Vitals/Pain Pain Assessment Pain Assessment: 0-10 Pain Score: 8  Pain Location: low back Pain Descriptors / Indicators: Sore Pain Intervention(s): Monitored during session    Home Living Family/patient expects to be discharged to:: Private residence Living Arrangements: Other relatives Available Help at Discharge: Friend(s);Available 24 hours/day Type of Home: House Home Access: Stairs to enter Entrance Stairs-Rails: Right Entrance Stairs-Number of Steps: 2   Home Layout: Able to live on main level with bedroom/bathroom Home Equipment:  Cane - single point      Prior Function Prior Level of Function : Independent/Modified Independent;Working/employed;Driving             Mobility  Comments: ambulates with SPC       Hand Dominance        Extremity/Trunk Assessment   Upper Extremity Assessment Upper Extremity Assessment: Defer to OT evaluation    Lower Extremity Assessment Lower Extremity Assessment: Generalized weakness    Cervical / Trunk Assessment Cervical / Trunk Assessment: Kyphotic;Back Surgery  Communication   Communication: No difficulties  Cognition Arousal/Alertness: Awake/alert Behavior During Therapy: WFL for tasks assessed/performed Overall Cognitive Status: Within Functional Limits for tasks assessed                                          General Comments General comments (skin integrity, edema, etc.): VSS on RA    Exercises     Assessment/Plan    PT Assessment Patient needs continued PT services  PT Problem List Decreased strength;Decreased activity tolerance;Decreased balance;Decreased mobility;Decreased knowledge of use of DME;Decreased knowledge of precautions;Pain       PT Treatment Interventions DME instruction;Gait training;Stair training;Functional mobility training;Therapeutic exercise;Therapeutic activities;Balance training;Neuromuscular re-education;Patient/family education    PT Goals (Current goals can be found in the Care Plan section)  Acute Rehab PT Goals Patient Stated Goal: to go home PT Goal Formulation: With patient Time For Goal Achievement: 10/05/22 Potential to Achieve Goals: Good    Frequency Min 5X/week     Co-evaluation               AM-PAC PT "6 Clicks" Mobility  Outcome Measure Help needed turning from your back to your side while in a flat bed without using bedrails?: A Little Help needed moving from lying on your back to sitting on the side of a flat bed without using bedrails?: A Little Help needed moving to and from a bed to a chair (including a wheelchair)?: A Little Help needed standing up from a chair using your arms (e.g., wheelchair or bedside chair)?: A  Little Help needed to walk in hospital room?: A Little Help needed climbing 3-5 steps with a railing? : A Little 6 Click Score: 18    End of Session   Activity Tolerance: Patient tolerated treatment well Patient left: in bed;with call bell/phone within reach;with family/visitor present Nurse Communication: Mobility status PT Visit Diagnosis: Other abnormalities of gait and mobility (R26.89);Muscle weakness (generalized) (M62.81);Pain Pain - part of body:  (back)    Time: 1610-9604 PT Time Calculation (min) (ACUTE ONLY): 9 min   Charges:   PT Evaluation $PT Eval Low Complexity: 1 Low          Arlyss Gandy, PT, DPT Acute Rehabilitation Office [phone]   Arlyss Gandy 10/01/2022, 11:38 AM

## 2022-10-01 NOTE — Evaluation (Signed)
Occupational Therapy Evaluation Patient Details Name: Wendy Arnold MRN: [id number] DOB: May 19, 1951 Today's Date: 10/01/2022   History of Present Illness 72 yo female s/p L3-4 revision hemilaminectomy L4-5 5/9 PMH insomnia diverticulitis anxiety   Clinical Impression   Patient is s/p L3-4 revision hemilamienctomy  surgery resulting in functional limitations due to the deficits listed below (see OT problem list). 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 Pt this session needed cues for gait velocity and posture in RW. Recommendation for RW for safety. Pt will have friend Margaretha Glassing for 10 days to help her with any adls with new back precautions. All adl education covered in detail.  Patient will benefit from skilled OT acutely to increase independence and safety with ADLS to allow discharge home .       Recommendations for follow up therapy are one component of a multi-disciplinary discharge planning process, led by the attending physician.  Recommendations may be updated based on patient status, additional functional criteria and insurance authorization.   Assistance Recommended at Discharge PRN  Patient can return home with the following Assist for transportation    Functional Status Assessment  Patient has had a recent decline in their functional status and demonstrates the ability to make significant improvements in function in a reasonable and predictable amount of time.  Equipment Recommendations  Other (comment) (RW)    Recommendations for Other Services       Precautions / Restrictions Precautions Precautions: Back Precaution Booklet Issued: Yes (comment) Precaution Comments: back handout provided Restrictions Weight Bearing Restrictions: No      Mobility Bed Mobility Overal bed mobility: Needs Assistance Bed Mobility: Supine to Sit, Rolling, Sit to Supine Rolling: Supervision   Supine to sit: Supervision Sit to supine: Min guard   General bed  mobility comments: long sitting on arrival and educated on back precautions. pt educated on rolling liek a log and reports increased comfort. Pt reports sleeping on her side is preferred    Transfers Overall transfer level: Needs assistance Equipment used: Rolling walker (2 wheels) Transfers: Sit to/from Stand Sit to Stand: Supervision           General transfer comment: cued about hand placement and not pulling on the walker and also not to fall back onto the bed surface without use of her hands      Balance Overall balance assessment: Needs assistance Sitting-balance support: Bilateral upper extremity supported, Feet supported Sitting balance-Leahy Scale: Good     Standing balance support: Bilateral upper extremity supported, During functional activity, Reliant on assistive device for balance Standing balance-Leahy Scale: Poor                             ADL either performed or assessed with clinical judgement   ADL Overall ADL's : Needs assistance/impaired Eating/Feeding: Independent   Grooming: Wash/dry hands;Wash/dry face;Oral care;Supervision/safety   Upper Body Bathing: Supervision/ safety   Lower Body Bathing: Supervison/ safety   Upper Body Dressing : Supervision/safety   Lower Body Dressing: Supervision/safety   Toilet Transfer: Supervision/safety           Functional mobility during ADLs: Supervision/safety;Rolling walker (2 wheels) General ADL Comments: pt dressed on arrival and dressing LB with figure4 cross. pt can do hygiene with anterior approach and squat position. pt educated on back precautions with peri care and hygiene needs     Vision Baseline Vision/History: 1 Wears glasses Patient Visual Report: No change from  baseline       Perception     Praxis      Pertinent Vitals/Pain Pain Assessment Pain Assessment: 0-10 Pain Score: 8  Pain Location: low back Pain Descriptors / Indicators: Sore Pain Intervention(s): Monitored  during session, Premedicated before session, Repositioned     Hand Dominance Right   Extremity/Trunk Assessment Upper Extremity Assessment Upper Extremity Assessment: RUE deficits/detail RUE Deficits / Details: decreased ability to reach external abduction to reach posteriorly for peri care   Lower Extremity Assessment Lower Extremity Assessment: Defer to PT evaluation;Generalized weakness   Cervical / Trunk Assessment Cervical / Trunk Assessment: Kyphotic;Back Surgery   Communication Communication Communication: No difficulties   Cognition Arousal/Alertness: Awake/alert Behavior During Therapy: WFL for tasks assessed/performed Overall Cognitive Status: Within Functional Limits for tasks assessed                                       General Comments  VSS on ARA    Exercises     Shoulder Instructions      Home Living Family/patient expects to be discharged to:: Private residence Living Arrangements: Other relatives Available Help at Discharge: Friend(s);Available 24 hours/day Type of Home: House Home Access: Stairs to enter [employer] of Steps: 2 Entrance Stairs-Rails: Right Home Layout: Able to live on main level with bedroom/bathroom     Bathroom Shower/Tub: Chief Strategy Officer: Standard     Home Equipment: Cane - single point   Additional Comments: staying at [facility] for 10 days and then returning home to a house with 1 step  to care for brother with developmental cognitive delays      Prior Functioning/Environment Prior Level of Function : Independent/Modified Independent;Working/employed;Driving             Mobility Comments: ambulates with SPC ADLs Comments: 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 She will have full time assistance for the first 10 days of recovery. 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        OT Problem List: Decreased activity tolerance;Impaired balance (sitting and/or standing);Decreased safety awareness;Decreased knowledge of use of DME or AE;Decreased knowledge of precautions;Pain      OT Treatment/Interventions: Self-care/ADL training;Therapeutic exercise;DME and/or AE instruction;Therapeutic activities;Patient/family education;Balance training;Manual therapy;Modalities    OT Goals(Current goals can be found in the care plan section) Acute Rehab OT Goals Patient Stated Goal: to go home asap OT Goal Formulation: With patient Time For Goal Achievement: 10/15/22 Potential to Achieve Goals: Good  OT Frequency: Min 2X/week    Co-evaluation              AM-PAC OT "6 Clicks" Daily Activity     Outcome Measure Help from another person eating meals?: None Help from another person taking care of personal grooming?: None Help from another person toileting, which includes using toliet, bedpan, or urinal?: None Help from another person bathing (including washing, rinsing, drying)?: None Help from another person to put on and taking off regular upper body clothing?: None Help from another person to put on and taking off regular lower body clothing?: None 6 Click Score: 24   End of Session Equipment Utilized During Treatment: Rolling walker (2 wheels) Nurse Communication: Mobility status;Precautions  Activity Tolerance: Patient tolerated treatment well Patient left: in  bed;with call bell/phone within reach;with family/visitor present  OT Visit Diagnosis: Unsteadiness on feet (R26.81);Muscle weakness (generalized) (M62.81)                Time: 4098-1191 OT Time Calculation (min): 20 min Charges:  OT General Charges $OT Visit: 1 Visit OT Evaluation $OT Eval Moderate Complexity: 1 Mod   Brynn, OTR/L  Acute Rehabilitation Services Office: [phone] .   Mateo Flow 10/01/2022, 12:01 PM

## 2022-10-01 NOTE — Discharge Summary (Signed)
Patient ID: Wendy Arnold MRN: [id number] DOB/AGE: [birth year] 70 y.o.  Admit date: 09/30/2022 Discharge date: 10/01/2022  Admission Diagnoses:  Principal Problem:   Spinal stenosis at L4-L5 level   Discharge Diagnoses:  Same  Past Medical History:  Diagnosis Date   Anxiety    Back pain    Depression    Diverticulitis    Hypertension    Insomnia    Migraines    Pneumonia    HX   PONV (postoperative nausea and vomiting)     Surgeries: Procedure(s): HEMI-LAMINECTOMY LUMBAR THREE-FOUR LEFT, REVISION HEMILAMINECTOMY LUMBAR FOUR-FIVE LEFT on 09/30/2022   Consultants:   Discharged Condition: Improved  Hospital Course: Wendy Arnold is an 72 y.o. female who was admitted 09/30/2022 for operative treatment ofSpinal stenosis at L4-L5 level. Patient has severe unremitting pain that affects sleep, daily activities, and work/hobbies. After pre-op clearance the patient was taken to the operating room on 09/30/2022 and underwent  Procedure(s): HEMI-LAMINECTOMY LUMBAR THREE-FOUR LEFT, REVISION HEMILAMINECTOMY LUMBAR FOUR-FIVE LEFT.    Patient was given perioperative antibiotics:  Anti-infectives (From admission, onward)    Start     Dose/Rate Route Frequency Ordered Stop   09/30/22 1600  ceFAZolin (ANCEF) IVPB 2g/100 mL premix        2 g 200 mL/hr over 30 Minutes Intravenous Every 8 hours 09/30/22 1117 09/30/22 2348   09/30/22 0600  ceFAZolin (ANCEF) IVPB 2g/100 mL premix        2 g 200 mL/hr over 30 Minutes Intravenous On call to O.R. 09/30/22 4098 09/30/22 0745        Patient was given sequential compression devices, early ambulation, and chemoprophylaxis to prevent DVT.  Patient benefited maximally from hospital stay and there were no complications.    Recent vital signs: Patient Vitals for the past 24 hrs:  BP Temp Temp src Pulse Resp SpO2  10/01/22 0740 (!) 151/87 98.4 F (36.9 C) Oral 78 18 95 %  10/01/22 0337 (!) 153/85 98.3 F (36.8 C) Oral 83 18 94 %  09/30/22 2304  (!) 143/77 98.4 F (36.9 C) Oral 95 18 96 %  09/30/22 1928 (!) 160/76 98.9 F (37.2 C) Oral 85 18 93 %  09/30/22 1545 (!) 143/61 98.8 F (37.1 C) Oral 81 16 93 %  09/30/22 1134 (!) 165/81 98 F (36.7 C) -- 80 16 97 %  09/30/22 1115 (!) 150/79 98 F (36.7 C) -- 70 18 92 %  09/30/22 1100 (!) 148/91 -- -- 74 20 94 %  09/30/22 1045 (!) 146/83 -- -- 72 16 93 %  09/30/22 1030 129/63 -- -- 72 14 93 %  09/30/22 1015 124/63 98 F (36.7 C) -- 78 16 93 %     Recent laboratory studies:  Recent Labs    09/29/22 0845 10/01/22 0150  WBC 8.6 15.4*  HGB 13.7 13.0  HCT 43.1 38.4  PLT 393 326  NA 133* 135  K 4.6 4.3  CL 99 104  CO2 27 25  BUN 19 18  CREATININE 1.17* 1.03*  GLUCOSE 113* 110*  CALCIUM 9.2 9.0     Discharge Medications:   Allergies as of 10/01/2022       Reactions   Flagyl [metronidazole] Nausea And Vomiting, Other (See Comments)   Severe headaches; leg cramps.   Amoxicillin-pot Clavulanate Rash        Medication List     STOP taking these medications    amLODipine-benazepril 10-20 MG capsule Commonly known as: LOTREL   aspirin EC  81 MG tablet   doxycycline 100 MG capsule Commonly known as: VIBRAMYCIN   ibuprofen 800 MG tablet Commonly known as: ADVIL       TAKE these medications    albuterol 108 (90 Base) MCG/ACT inhaler Commonly known as: VENTOLIN HFA Inhale 1-2 puffs into the lungs every 6 (six) hours as needed for wheezing or shortness of breath.   ALPRAZolam 0.5 MG tablet Commonly known as: Xanax Take 1 tablet (0.5 mg total) by mouth 2 (two) times daily as needed for anxiety. What changed: when to take this   amLODipine 10 MG tablet Commonly known as: NORVASC Take 10 mg by mouth daily.   cyclobenzaprine 10 MG tablet Commonly known as: FLEXERIL Take 1 tablet (10 mg total) by mouth 2 (two) times daily as needed for muscle spasms.   docusate sodium 100 MG capsule Commonly known as: Colace Take 1 capsule (100 mg total) by mouth 2  (two) times daily as needed for mild constipation.   ezetimibe 10 MG tablet Commonly known as: Zetia Take 1 tablet (10 mg total) by mouth daily.   FLUoxetine 40 MG capsule Commonly known as: PROZAC Take 40 mg by mouth daily.   gabapentin 300 MG capsule Commonly known as: NEURONTIN TAKE 1 CAPSULE THREE TIMES DAILY   HYDROmorphone 2 MG tablet Commonly known as: Dilaudid Take 1 tablet (2 mg total) by mouth every 4 (four) hours as needed for up to 7 days for severe pain.   LUBRICATING EYE DROPS OP Place 1 drop into both eyes daily as needed (dry eyes).   Narcan 4 MG/0.1ML Liqd nasal spray kit Generic drug: naloxone Place 1 spray into the nose as needed (opioid overdose).   Percocet 10-325 MG tablet Generic drug: oxyCODONE-acetaminophen Take 1 tablet by mouth 4 (four) times daily as needed for pain.   polyethylene glycol 17 g packet Commonly known as: MIRALAX / GLYCOLAX Take 17 g by mouth daily.   traZODone 150 MG tablet Commonly known as: DESYREL TAKE 1 TABLET AT BEDTIME. MAY TAKE 150MG  AS PRESCRIBED        Diagnostic Studies: DG Lumbar Spine 2-3 Views  Result Date: 09/30/2022 CLINICAL DATA:  Herniated nucleus polyposis. Preadmit for lumbar surgery. EXAM: LUMBAR SPINE - 2-3 VIEW COMPARISON:  None Available. FINDINGS: Standing AP and lateral views of the lumbar spine obtained. There are 5 non-rib-bearing lumbar vertebra. Grade 1 anterolisthesis of L4 on L5. L5-S1 disc space narrowing and spurring. Moderate multilevel facet hypertrophy. Vertebral body heights are normal. IMPRESSION: 1. Five non-rib-bearing lumbar vertebra. 2. L5-S1 disc space narrowing and spurring. 3. Moderate multilevel facet hypertrophy. Grade 1 anterolisthesis of L4 on L5. Electronically Signed   By: Narda Rutherford M.D.   On: 09/30/2022 13:08   DG Lumbar Spine 2-3 Views  Result Date: 09/30/2022 CLINICAL DATA:  Elective surgery. Intra op for localization. EXAM: LUMBAR SPINE - 2-3 VIEW COMPARISON:   Radiograph 09/29/2022 demonstrating 5 non-rib-bearing lumbar vertebra. FINDINGS: Three cross-table lateral views of the lumbar spine obtained in the operating room. Image 1 demonstrates surgical instruments localizing posterior to L4 and L5 spinous processes. Image 2 demonstrates surgical instrument localizing posterior to and just above L5 spinous process. Image 3 demonstrates surgical instruments at the posterior aspect of the inferior L4 vertebral body as well as posterior to the L3-L4 disc space. IMPRESSION: Intraoperative localization during lumbar spine surgery. Electronically Signed   By: Narda Rutherford M.D.   On: 09/30/2022 13:07    Disposition: Discharge disposition: 01-Home or Self Care  Discharge Instructions     Call MD / Call 911   Complete by: As directed    If you experience chest pain or shortness of breath, CALL 911 and be transported to the hospital emergency room.  If you develope a fever above 101 F, pus (white drainage) or increased drainage or redness at the wound, or calf pain, call your surgeon's office.   Constipation Prevention   Complete by: As directed    Drink plenty of fluids.  Prune juice may be helpful.  You may use a stool softener, such as Colace (over the counter) 100 mg twice a day.  Use MiraLax (over the counter) for constipation as needed.   Diet - low sodium heart healthy   Complete by: As directed    Increase activity slowly as tolerated   Complete by: As directed    Post-operative opioid taper instructions:   Complete by: As directed    POST-OPERATIVE OPIOID TAPER INSTRUCTIONS: It is important to wean off of your opioid medication as soon as possible. If you do not need pain medication after your surgery it is ok to stop day one. Opioids include: Codeine, Hydrocodone(Norco, Vicodin), Oxycodone(Percocet, oxycontin) and hydromorphone amongst others.  Long term and even short term use of opiods can cause: Increased pain  response Dependence Constipation Depression Respiratory depression And more.  Withdrawal symptoms can include Flu like symptoms Nausea, vomiting And more Techniques to manage these symptoms Hydrate well Eat regular healthy meals Stay active Use relaxation techniques(deep breathing, meditating, yoga) Do Not substitute Alcohol to help with tapering If you have been on opioids for less than two weeks and do not have pain than it is ok to stop all together.  Plan to wean off of opioids This plan should start within one week post op of your joint replacement. Maintain the same interval or time between taking each dose and first decrease the dose.  Cut the total daily intake of opioids by one tablet each day Next start to increase the time between doses. The last dose that should be eliminated is the evening dose.           Follow-up Information     Jene Every, MD. Call in 2 week(s).   Specialty: Orthopedic Surgery Why: As needed, If symptoms worsen Contact information: [address] [place] [address] [phone]                  Signed: Dorothy Spark 10/01/2022, 8:07 AM

## 2022-10-01 NOTE — Progress Notes (Signed)
Subjective: 1 Day Post-Op Procedure(s) (LRB): HEMI-LAMINECTOMY LUMBAR THREE-FOUR LEFT, REVISION HEMILAMINECTOMY LUMBAR FOUR-FIVE LEFT (Left) Patient reports pain as mild and moderate.  Mostly incisional pain. Some aching in her hip overnight. No other c/o. Ready to go home today.  Objective: Vital signs in last 24 hours: Temp:  [98 F (36.7 C)-98.9 F (37.2 C)] 98.4 F (36.9 C) (05/10 0740) Pulse Rate:  [70-95] 78 (05/10 0740) Resp:  [14-20] 18 (05/10 0740) BP: (124-165)/(61-91) 151/87 (05/10 0740) SpO2:  [92 %-97 %] 95 % (05/10 0740)  Intake/Output from previous day: 05/09 0701 - 05/10 0700 In: 1040 [P.O.:240; I.V.:600; IV Piggyback:200] Out: 225 [Urine:200; Blood:25] Intake/Output this shift: No intake/output data recorded.  Recent Labs    09/29/22 0845 10/01/22 0150  HGB 13.7 13.0   Recent Labs    09/29/22 0845 10/01/22 0150  WBC 8.6 15.4*  RBC 4.36 4.03  HCT 43.1 38.4  PLT 393 326   Recent Labs    09/29/22 0845 10/01/22 0150  NA 133* 135  K 4.6 4.3  CL 99 104  CO2 27 25  BUN 19 18  CREATININE 1.17* 1.03*  GLUCOSE 113* 110*  CALCIUM 9.2 9.0   No results for input(s): "LABPT", "INR" in the last 72 hours.  Neurologically intact ABD soft Neurovascular intact Sensation intact distally Intact pulses distally Dorsiflexion/Plantar flexion intact Incision: dressing C/D/I and no drainage No cellulitis present Compartment soft No sign of DVT   Assessment/Plan: 1 Day Post-Op Procedure(s) (LRB): HEMI-LAMINECTOMY LUMBAR THREE-FOUR LEFT, REVISION HEMILAMINECTOMY LUMBAR FOUR-FIVE LEFT (Left) Advance diet Up with therapy D/C IV fluids D/C to home today Discussed dressing instructions, DC instructions, LSpine precautions   Dorothy Spark 10/01/2022, 8:05 AM

## 2022-10-09 ENCOUNTER — Other Ambulatory Visit: Payer: Self-pay | Admitting: Nurse Practitioner

## 2022-10-09 DIAGNOSIS — M545 Low back pain, unspecified: Secondary | ICD-10-CM

## 2023-01-18 ENCOUNTER — Other Ambulatory Visit: Payer: Self-pay

## 2023-01-18 ENCOUNTER — Ambulatory Visit: Payer: Medicare HMO | Attending: Specialist | Admitting: Physical Therapy

## 2023-01-18 DIAGNOSIS — M62830 Muscle spasm of back: Secondary | ICD-10-CM | POA: Diagnosis present

## 2023-01-18 DIAGNOSIS — M5459 Other low back pain: Secondary | ICD-10-CM | POA: Diagnosis present

## 2023-01-18 DIAGNOSIS — R293 Abnormal posture: Secondary | ICD-10-CM | POA: Insufficient documentation

## 2023-01-18 NOTE — Therapy (Signed)
OUTPATIENT PHYSICAL THERAPY THORACOLUMBAR EVALUATION   Patient Name: Wendy Arnold MRN: [id number] DOB:02-16-1951, 72 y.o., female Today's Date: 01/18/2023  END OF SESSION:  PT End of Session - 01/18/23 1330     Visit Number 1    Number of Visits 12    Date for PT Re-Evaluation 03/01/23    Authorization Type FOTO.    PT Start Time 0100    PT Stop Time 0145    PT Time Calculation (min) 45 min    Activity Tolerance Patient tolerated treatment well    Behavior During Therapy WFL for tasks assessed/performed             Past Medical History:  Diagnosis Date   Anxiety    Back pain    Depression    Diverticulitis    Hypertension    Insomnia    Migraines    Pneumonia    HX   PONV (postoperative nausea and vomiting)    Past Surgical History:  Procedure Laterality Date   ABDOMINAL HYSTERECTOMY     BACK SURGERY     LUMBAR LAMINECTOMY/DECOMPRESSION MICRODISCECTOMY Left 09/30/2022   Procedure: HEMI-LAMINECTOMY LUMBAR THREE-FOUR LEFT, REVISION HEMILAMINECTOMY LUMBAR FOUR-FIVE LEFT;  Surgeon: Jene Every, MD;  Location: [facility] OR;  Service: Orthopedics;  Laterality: Left;  3 C-BED   SINUS ENDO WITH FUSION     thumb surgery Left    TOE SURGERY Bilateral    both "pinky toes" bone shaved   Patient Active Problem List   Diagnosis Date Noted   Spinal stenosis at L4-L5 level 09/30/2022   Chronic midline low back pain with left-sided sciatica 09/24/2019   GAD (generalized anxiety disorder) 09/24/2019   Mixed hyperlipidemia 09/24/2019   Primary insomnia 06/25/2019   Essential hypertension 06/25/2019   Coronary artery disease due to lipid rich plaque 01/21/2016   Nicotine dependence, cigarettes, uncomplicated 01/21/2016   Chronic obstructive pulmonary disease (HCC) 02/24/2015   Alpha-1-antitrypsin deficiency carrier 07/24/2011    REFERRING PROVIDER: Jene Every MD  REFERRING DIAG: Vertebrogenic low back pain  Rationale for Evaluation and Treatment:  Rehabilitation  THERAPY DIAG:  Other low back pain  Abnormal posture  Muscle spasm of back  ONSET DATE: 1996.  SUBJECTIVE:                                                                                                                                                                                           SUBJECTIVE STATEMENT: The patient presents to the clinic with c/o low back pain that radiates into both buttocks.  Her pain is rated at a 6/10 and can rise to higher levels with increased activity.  She states cleaning her refrigerator yesterday increased her pain and made her sore.  She describes her pain as an ache, throbbing, sore and sometimes shooting.  Pain medication helps decrease her pain some.  She would like to be able to decrease her pain and move better so she can care for her brother.    PERTINENT HISTORY:  Lumbar surgery (1996), hemilaminectomy (09/30/22).  PAIN:  Are you having pain? Yes: NPRS scale: 6/10 Pain location: LB and into buttocks. Pain description: As above. Aggravating factors: As above. Relieving factors: As above.  PRECAUTIONS: Other: Please supervise gait.  She has two canes at home.  Recommend she walk with a cane.  RED FLAGS: None   WEIGHT BEARING RESTRICTIONS: No  FALLS:  Has patient fallen in last 6 months? Yes. Number of falls .  She states she recently went to sit in a chair and the legs slipped off porcha nd she fell onto her left side.  LIVING ENVIRONMENT: Lives in: House/apartment Stairs:  One step.  Non-reciprocating stair gait. Has following equipment at home: None.  Recommended she use a cane.  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  PLOF: Independent with basic ADLs  PATIENT GOALS: Decrease pain and be able to do more and care for brother.  OBJECTIVE:   PATIENT SURVEYS:  FOTO .  POSTURE: rounded shoulders, forward head, decreased lumbar lordosis, and flexed trunk   PALPATION: Tender and taut to palpation over patient's  bilateral lumbar musculature.  LUMBAR ROM:   The patient stands in 20 degrees of trunk flexion and can achieve only -10 degrees from the neutral position.  Flexion is limited by 60%.  LOWER EXTREMITY MMT:    Bilateral knee and ankle strength is graded at 4 to 4+/5.  DTR's:    Bilateral Patellar reflexes are 2+/4+ and bilateral Achilles 1+/4+.  FUNCTIONAL TESTS:  5 times sit to stand: Performed in 22 seconds.  GAIT: Slow and purposeful gait pattern with patient walking in a flexed truck posture with decreased step and stride length.  TODAY'S TREATMENT:                                                                                                                              DATE: HMP and IFC at 80-150 Hz on 40% scan x  20 minutes to patient low back.   Normal modality response following removal of modality.  PATIENT EDUCATION:   HOME EXERCISE PROGRAM:  ASSESSMENT:  CLINICAL IMPRESSION: The patient presents to the clinic with chronic low back pain.  She underwent a hemi-laminectomy on 09/30/22.  Her pain rises to high levels with increased activity.  Her ability to perform ADL's is limited due to pain.  She c/o palpable pain over her lumbar musculature.  She stands in a flexed trunk posture and was unable to achieve an upright posture.  Her 5 time sit to stand test was performed in 22 seconds.  It is recommended she use a  cane while walking.  Patient will benefit from skilled physical therapy intervention to address pain and deficits.  OBJECTIVE IMPAIRMENTS: Abnormal gait, decreased activity tolerance, decreased strength, increased muscle spasms, postural dysfunction, and pain.   ACTIVITY LIMITATIONS: carrying, lifting, bending, stairs, and locomotion level  PARTICIPATION LIMITATIONS: meal prep, cleaning, and laundry  PERSONAL FACTORS: Time since onset of injury/illness/exacerbation and 1 comorbidity: two prior lumbar surgeries  are also affecting patient's functional outcome.    REHAB POTENTIAL: Good  CLINICAL DECISION MAKING: Stable/uncomplicated  EVALUATION COMPLEXITY: Low   GOALS: Goals reviewed with patient? Yes  SHORT TERM GOALS: Target date: 01/31/13  Ind with an initial HEP. Goal status: INITIAL   LONG TERM GOALS: Target date: 03/01/23  Ind with an advanced HEP.  Goal status: INITIAL  2.  Patient achieve active lumbar extension to 10 degrees.  Goal status: INITIAL  3.  Patient perform ADL's with pain not > 3-4/10.  Goal status: INITIAL  4.  5 time sit to stand performed in 15 seconds. Baseline:  Goal status: INITIAL  PLAN:  PT FREQUENCY: 2x/week  PT DURATION: 6 weeks  PLANNED INTERVENTIONS: Therapeutic exercises, Therapeutic activity, Neuromuscular re-education, Gait training, Patient/Family education, Self Care, Electrical stimulation, Cryotherapy, Moist heat, Ultrasound, and Manual therapy.  PLAN FOR NEXT SESSION: Combo e'stim/US at 1.50 W/CM2, STW/M, core exercise progression, spinal protection techniques and body mechanics training.    Aideen Fenster, [place], PT 01/18/2023, 3:29 PM

## 2023-01-20 ENCOUNTER — Ambulatory Visit: Payer: Medicare HMO | Admitting: *Deleted

## 2023-02-14 ENCOUNTER — Ambulatory Visit: Payer: Medicare HMO | Attending: Specialist

## 2023-02-14 DIAGNOSIS — M5459 Other low back pain: Secondary | ICD-10-CM | POA: Insufficient documentation

## 2023-02-14 DIAGNOSIS — M62830 Muscle spasm of back: Secondary | ICD-10-CM | POA: Insufficient documentation

## 2023-02-14 DIAGNOSIS — R293 Abnormal posture: Secondary | ICD-10-CM | POA: Insufficient documentation

## 2023-02-14 NOTE — Therapy (Signed)
OUTPATIENT PHYSICAL THERAPY THORACOLUMBAR EVALUATION   Patient Name: Wendy Arnold MRN: [id number] DOB:1950/12/14, 72 y.o., female Today's Date: 02/14/2023  END OF SESSION:  PT End of Session - 02/14/23 1436     Visit Number 2    Number of Visits 12    Date for PT Re-Evaluation 03/01/23    Authorization Type FOTO.    PT Start Time 1430    PT Stop Time 1530    PT Time Calculation (min) 60 min    Activity Tolerance Patient tolerated treatment well    Behavior During Therapy WFL for tasks assessed/performed             Past Medical History:  Diagnosis Date   Anxiety    Back pain    Depression    Diverticulitis    Hypertension    Insomnia    Migraines    Pneumonia    HX   PONV (postoperative nausea and vomiting)    Past Surgical History:  Procedure Laterality Date   ABDOMINAL HYSTERECTOMY     BACK SURGERY     LUMBAR LAMINECTOMY/DECOMPRESSION MICRODISCECTOMY Left 09/30/2022   Procedure: HEMI-LAMINECTOMY LUMBAR THREE-FOUR LEFT, REVISION HEMILAMINECTOMY LUMBAR FOUR-FIVE LEFT;  Surgeon: Jene Every, MD;  Location: [facility] OR;  Service: Orthopedics;  Laterality: Left;  3 C-BED   SINUS ENDO WITH FUSION     thumb surgery Left    TOE SURGERY Bilateral    both "pinky toes" bone shaved   Patient Active Problem List   Diagnosis Date Noted   Spinal stenosis at L4-L5 level 09/30/2022   Chronic midline low back pain with left-sided sciatica 09/24/2019   GAD (generalized anxiety disorder) 09/24/2019   Mixed hyperlipidemia 09/24/2019   Primary insomnia 06/25/2019   Essential hypertension 06/25/2019   Coronary artery disease due to lipid rich plaque 01/21/2016   Nicotine dependence, cigarettes, uncomplicated 01/21/2016   Chronic obstructive pulmonary disease (HCC) 02/24/2015   Alpha-1-antitrypsin deficiency carrier 07/24/2011    REFERRING PROVIDER: Jene Every MD  REFERRING DIAG: Vertebrogenic low back pain  Rationale for Evaluation and Treatment:  Rehabilitation  THERAPY DIAG:  Other low back pain  Abnormal posture  Muscle spasm of back  ONSET DATE: 1996.  SUBJECTIVE:                                                                                                                                                                                           SUBJECTIVE STATEMENT: Pt reports 8/10 low back pain.   PERTINENT HISTORY:  Lumbar surgery (1996), hemilaminectomy (09/30/22).  PAIN:  Are you having pain? Yes: NPRS scale: 8/10 Pain location: LB and into buttocks.  Pain description: As above. Aggravating factors: As above. Relieving factors: As above.  PRECAUTIONS: Other: Please supervise gait.  She has two canes at home.  Recommend she walk with a cane.  RED FLAGS: None   WEIGHT BEARING RESTRICTIONS: No  FALLS:  Has patient fallen in last 6 months? Yes. Number of falls .  She states she recently went to sit in a chair and the legs slipped off porcha nd she fell onto her left side.  LIVING ENVIRONMENT: Lives in: House/apartment Stairs:  One step.  Non-reciprocating stair gait. Has following equipment at home: None.  Recommended she use a cane.  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  PLOF: Independent with basic ADLs  PATIENT GOALS: Decrease pain and be able to do more and care for brother.  OBJECTIVE:   PATIENT SURVEYS:  FOTO .  POSTURE: rounded shoulders, forward head, decreased lumbar lordosis, and flexed trunk   PALPATION: Tender and taut to palpation over patient's bilateral lumbar musculature.  LUMBAR ROM:   The patient stands in 20 degrees of trunk flexion and can achieve only -10 degrees from the neutral position.  Flexion is limited by 60%.  LOWER EXTREMITY MMT:    Bilateral knee and ankle strength is graded at 4 to 4+/5.  DTR's:    Bilateral Patellar reflexes are 2+/4+ and bilateral Achilles 1+/4+.  FUNCTIONAL TESTS:  5 times sit to stand: Performed in 22 seconds.  GAIT: Slow and  purposeful gait pattern with patient walking in a flexed truck posture with decreased step and stride length.  TODAY'S TREATMENT:                                                                                                                              DATE:                                    EXERCISE LOG  Exercise Repetitions and Resistance Comments  Nustep Lvl 2 x 15 mins        Blank cell = exercise not performed today   Manual Therapy Soft Tissue Mobilization: Right lumbar, STW/M right lumbar paraspinals to decrease pain and tone with pt in left side-lying for comfort with pillow between her knees    Modalities  Date:  Unattended Estim: Lumbar, IFC 80-150 Hz, 15 mins, Pain Combo: Lumbar, 1.5 w/cm2 at 100%, 10 mins, Pain Hot Pack: Lumbar, 15 mins, Pain and Tone                                    PATIENT EDUCATION:   HOME EXERCISE PROGRAM:  ASSESSMENT:  CLINICAL IMPRESSION: Pt arrives for today's treatment session reporting 8/10 low back pain.  Pt reports increased lumbar and thoracic back pain since evaluation.  Pt able to tolerate introduction to Nustep for warm  up without issue or complaint.  STW/M performed to right lumbar paraspinals to decrease pain and tone.  Normal responses to all modalities noted upon removal.  Pt reported 4/10 right low back pain at completion of today's treatment session.   OBJECTIVE IMPAIRMENTS: Abnormal gait, decreased activity tolerance, decreased strength, increased muscle spasms, postural dysfunction, and pain.   ACTIVITY LIMITATIONS: carrying, lifting, bending, stairs, and locomotion level  PARTICIPATION LIMITATIONS: meal prep, cleaning, and laundry  PERSONAL FACTORS: Time since onset of injury/illness/exacerbation and 1 comorbidity: two prior lumbar surgeries  are also affecting patient's functional outcome.   REHAB POTENTIAL: Good  CLINICAL DECISION MAKING: Stable/uncomplicated  EVALUATION COMPLEXITY: Low   GOALS: Goals reviewed  with patient? Yes  SHORT TERM GOALS: Target date: 01/31/13  Ind with an initial HEP. Goal status: INITIAL   LONG TERM GOALS: Target date: 03/01/23  Ind with an advanced HEP.  Goal status: INITIAL  2.  Patient achieve active lumbar extension to 10 degrees.  Goal status: INITIAL  3.  Patient perform ADL's with pain not > 3-4/10.  Goal status: INITIAL  4.  5 time sit to stand performed in 15 seconds. Baseline:  Goal status: INITIAL  PLAN:  PT FREQUENCY: 2x/week  PT DURATION: 6 weeks  PLANNED INTERVENTIONS: Therapeutic exercises, Therapeutic activity, Neuromuscular re-education, Gait training, Patient/Family education, Self Care, Electrical stimulation, Cryotherapy, Moist heat, Ultrasound, and Manual therapy.  PLAN FOR NEXT SESSION: Combo e'stim/US at 1.50 W/CM2, STW/M, core exercise progression, spinal protection techniques and body mechanics training.    Newman Pies, PTA 02/14/2023, 3:31 PM

## 2023-02-22 ENCOUNTER — Ambulatory Visit: Payer: Medicare HMO | Attending: Specialist

## 2023-02-22 DIAGNOSIS — M62830 Muscle spasm of back: Secondary | ICD-10-CM | POA: Diagnosis present

## 2023-02-22 DIAGNOSIS — R293 Abnormal posture: Secondary | ICD-10-CM | POA: Insufficient documentation

## 2023-02-22 DIAGNOSIS — M5459 Other low back pain: Secondary | ICD-10-CM | POA: Insufficient documentation

## 2023-02-22 NOTE — Therapy (Signed)
OUTPATIENT PHYSICAL THERAPY THORACOLUMBAR EVALUATION   Patient Name: Wendy Arnold MRN: [id number] DOB:1951-01-15, 72 y.o., female Today's Date: 02/22/2023  END OF SESSION:  PT End of Session - 02/22/23 1435     Visit Number 3    Number of Visits 12    Date for PT Re-Evaluation 03/01/23    Authorization Type FOTO.    PT Start Time 1430    PT Stop Time 1530    PT Time Calculation (min) 60 min    Activity Tolerance Patient tolerated treatment well    Behavior During Therapy WFL for tasks assessed/performed             Past Medical History:  Diagnosis Date   Anxiety    Back pain    Depression    Diverticulitis    Hypertension    Insomnia    Migraines    Pneumonia    HX   PONV (postoperative nausea and vomiting)    Past Surgical History:  Procedure Laterality Date   ABDOMINAL HYSTERECTOMY     BACK SURGERY     LUMBAR LAMINECTOMY/DECOMPRESSION MICRODISCECTOMY Left 09/30/2022   Procedure: HEMI-LAMINECTOMY LUMBAR THREE-FOUR LEFT, REVISION HEMILAMINECTOMY LUMBAR FOUR-FIVE LEFT;  Surgeon: Jene Every, MD;  Location: [facility] OR;  Service: Orthopedics;  Laterality: Left;  3 C-BED   SINUS ENDO WITH FUSION     thumb surgery Left    TOE SURGERY Bilateral    both "pinky toes" bone shaved   Patient Active Problem List   Diagnosis Date Noted   Spinal stenosis at L4-L5 level 09/30/2022   Chronic midline low back pain with left-sided sciatica 09/24/2019   GAD (generalized anxiety disorder) 09/24/2019   Mixed hyperlipidemia 09/24/2019   Primary insomnia 06/25/2019   Essential hypertension 06/25/2019   Coronary artery disease due to lipid rich plaque 01/21/2016   Nicotine dependence, cigarettes, uncomplicated 01/21/2016   Chronic obstructive pulmonary disease (HCC) 02/24/2015   Alpha-1-antitrypsin deficiency carrier 07/24/2011    REFERRING PROVIDER: Jene Every MD  REFERRING DIAG: Vertebrogenic low back pain  Rationale for Evaluation and Treatment:  Rehabilitation  THERAPY DIAG:  Other low back pain  Abnormal posture  Muscle spasm of back  ONSET DATE: 1996.  SUBJECTIVE:                                                                                                                                                                                           SUBJECTIVE STATEMENT: Pt reports 5/10 low back pain and 9/10 right shoulder and neck pain.   PERTINENT HISTORY:  Lumbar surgery (1996), hemilaminectomy (09/30/22).  PAIN:  Are you having pain? Yes: NPRS scale:  5/10 Pain location: LB and into buttocks. Pain description: As above. Aggravating factors: As above. Relieving factors: As above.  PRECAUTIONS: Other: Please supervise gait.  She has two canes at home.  Recommend she walk with a cane.  RED FLAGS: None   WEIGHT BEARING RESTRICTIONS: No  FALLS:  Has patient fallen in last 6 months? Yes. Number of falls .  She states she recently went to sit in a chair and the legs slipped off porcha nd she fell onto her left side.  LIVING ENVIRONMENT: Lives in: House/apartment Stairs:  One step.  Non-reciprocating stair gait. Has following equipment at home: None.  Recommended she use a cane.  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  PLOF: Independent with basic ADLs  PATIENT GOALS: Decrease pain and be able to do more and care for brother.  OBJECTIVE:   PATIENT SURVEYS:  FOTO .  POSTURE: rounded shoulders, forward head, decreased lumbar lordosis, and flexed trunk   PALPATION: Tender and taut to palpation over patient's bilateral lumbar musculature.  LUMBAR ROM:   The patient stands in 20 degrees of trunk flexion and can achieve only -10 degrees from the neutral position.  Flexion is limited by 60%.  LOWER EXTREMITY MMT:    Bilateral knee and ankle strength is graded at 4 to 4+/5.  DTR's:    Bilateral Patellar reflexes are 2+/4+ and bilateral Achilles 1+/4+.  FUNCTIONAL TESTS:  5 times sit to stand: Performed in  22 seconds.  GAIT: Slow and purposeful gait pattern with patient walking in a flexed truck posture with decreased step and stride length.  TODAY'S TREATMENT:                                                                                                                              DATE:                                    EXERCISE LOG  Exercise Repetitions and Resistance Comments  Nustep Lvl 2 x 15 mins        Blank cell = exercise not performed today   Manual Therapy Soft Tissue Mobilization: Right lumbar, STW/M right lumbar paraspinals to decrease pain and tone with pt in left side-lying for comfort with pillow between her knees    Modalities  Date:  Unattended Estim: Lumbar, IFC 80-150 Hz, 15 mins, Pain Combo: Lumbar, 1.5 w/cm2 at 100%, 12 mins, Pain Hot Pack: Lumbar, 15 mins, Pain and Tone                                    PATIENT EDUCATION:   HOME EXERCISE PROGRAM:  ASSESSMENT:  CLINICAL IMPRESSION: Pt arrives for today's treatment session reporting 5/10 low back pain and 9/10 right shoulder and neck pain.  STW/M performed to bil lumbar paraspinals  with concentration to right side.  Normal responses to all modalities noted upon completion.  Pt reported decreased low back pain at completion of today's treatment session.   OBJECTIVE IMPAIRMENTS: Abnormal gait, decreased activity tolerance, decreased strength, increased muscle spasms, postural dysfunction, and pain.   ACTIVITY LIMITATIONS: carrying, lifting, bending, stairs, and locomotion level  PARTICIPATION LIMITATIONS: meal prep, cleaning, and laundry  PERSONAL FACTORS: Time since onset of injury/illness/exacerbation and 1 comorbidity: two prior lumbar surgeries  are also affecting patient's functional outcome.   REHAB POTENTIAL: Good  CLINICAL DECISION MAKING: Stable/uncomplicated  EVALUATION COMPLEXITY: Low   GOALS: Goals reviewed with patient? Yes  SHORT TERM GOALS: Target date: 01/31/13  Ind with an  initial HEP. Goal status: INITIAL   LONG TERM GOALS: Target date: 03/01/23  Ind with an advanced HEP.  Goal status: INITIAL  2.  Patient achieve active lumbar extension to 10 degrees.  Goal status: INITIAL  3.  Patient perform ADL's with pain not > 3-4/10.  Goal status: INITIAL  4.  5 time sit to stand performed in 15 seconds. Baseline:  Goal status: INITIAL  PLAN:  PT FREQUENCY: 2x/week  PT DURATION: 6 weeks  PLANNED INTERVENTIONS: Therapeutic exercises, Therapeutic activity, Neuromuscular re-education, Gait training, Patient/Family education, Self Care, Electrical stimulation, Cryotherapy, Moist heat, Ultrasound, and Manual therapy.  PLAN FOR NEXT SESSION: Combo e'stim/US at 1.50 W/CM2, STW/M, core exercise progression, spinal protection techniques and body mechanics training.    Newman Pies, PTA 02/22/2023, 4:29 PM

## 2023-02-24 ENCOUNTER — Ambulatory Visit: Payer: Medicare HMO | Admitting: Physical Therapy

## 2023-02-24 DIAGNOSIS — M5459 Other low back pain: Secondary | ICD-10-CM

## 2023-02-24 DIAGNOSIS — R293 Abnormal posture: Secondary | ICD-10-CM

## 2023-02-24 DIAGNOSIS — M62830 Muscle spasm of back: Secondary | ICD-10-CM

## 2023-02-24 NOTE — Therapy (Signed)
OUTPATIENT PHYSICAL THERAPY THORACOLUMBAR EVALUATION   Patient Name: Wendy Arnold MRN: [id number] DOB:[birth year], 72 y.o., female Today's Date: 02/24/2023  END OF SESSION:  PT End of Session - 02/24/23 1510     Visit Number 4    Number of Visits 12    Date for PT Re-Evaluation 03/01/23    Authorization Type FOTO.    PT Start Time 0238    PT Stop Time 0331    PT Time Calculation (min) 53 min    Activity Tolerance Patient tolerated treatment well    Behavior During Therapy WFL for tasks assessed/performed             Past Medical History:  Diagnosis Date   Anxiety    Back pain    Depression    Diverticulitis    Hypertension    Insomnia    Migraines    Pneumonia    HX   PONV (postoperative nausea and vomiting)    Past Surgical History:  Procedure Laterality Date   ABDOMINAL HYSTERECTOMY     BACK SURGERY     LUMBAR LAMINECTOMY/DECOMPRESSION MICRODISCECTOMY Left 09/30/2022   Procedure: HEMI-LAMINECTOMY LUMBAR THREE-FOUR LEFT, REVISION HEMILAMINECTOMY LUMBAR FOUR-FIVE LEFT;  Surgeon: Jene Every, MD;  Location: [facility] OR;  Service: Orthopedics;  Laterality: Left;  3 C-BED   SINUS ENDO WITH FUSION     thumb surgery Left    TOE SURGERY Bilateral    both "pinky toes" bone shaved   Patient Active Problem List   Diagnosis Date Noted   Spinal stenosis at L4-L5 level 09/30/2022   Chronic midline low back pain with left-sided sciatica 09/24/2019   GAD (generalized anxiety disorder) 09/24/2019   Mixed hyperlipidemia 09/24/2019   Primary insomnia 06/25/2019   Essential hypertension 06/25/2019   Coronary artery disease due to lipid rich plaque 01/21/2016   Nicotine dependence, cigarettes, uncomplicated 01/21/2016   Chronic obstructive pulmonary disease (HCC) 02/24/2015   Alpha-1-antitrypsin deficiency carrier 07/24/2011    REFERRING PROVIDER: Jene Every MD  REFERRING DIAG: Vertebrogenic low back pain  Rationale for Evaluation and Treatment:  Rehabilitation  THERAPY DIAG:  Other low back pain  Abnormal posture  Muscle spasm of back  ONSET DATE: 1996.  SUBJECTIVE:                                                                                                                                                                                           SUBJECTIVE STATEMENT: LBP around a 6 today.  PERTINENT HISTORY:  Lumbar surgery (1996), hemilaminectomy (09/30/22).  PAIN:  Are you having pain? Yes: NPRS scale: 5/10 Pain location: LB and into buttocks. Pain description:  As above. Aggravating factors: As above. Relieving factors: As above.  PRECAUTIONS: Other: Please supervise gait.  She has two canes at home.  Recommend she walk with a cane.  RED FLAGS: None   WEIGHT BEARING RESTRICTIONS: No  FALLS:  Has patient fallen in last 6 months? Yes. Number of falls .  She states she recently went to sit in a chair and the legs slipped off porch and she fell onto her left side.  LIVING ENVIRONMENT: Lives in: House/apartment Stairs:  One step.  Non-reciprocating stair gait. Has following equipment at home: None.  Recommended she use a cane.  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  PLOF: Independent with basic ADLs  PATIENT GOALS: Decrease pain and be able to do more and care for brother.  OBJECTIVE:   PATIENT SURVEYS:  FOTO .  POSTURE: rounded shoulders, forward head, decreased lumbar lordosis, and flexed trunk   PALPATION: Tender and taut to palpation over patient's bilateral lumbar musculature.  LUMBAR ROM:   The patient stands in 20 degrees of trunk flexion and can achieve only -10 degrees from the neutral position.  Flexion is limited by 60%.  LOWER EXTREMITY MMT:    Bilateral knee and ankle strength is graded at 4 to 4+/5.  DTR's:    Bilateral Patellar reflexes are 2+/4+ and bilateral Achilles 1+/4+.  FUNCTIONAL TESTS:  5 times sit to stand: Performed in 22 seconds.  GAIT: Slow and purposeful gait  pattern with patient walking in a flexed truck posture with decreased step and stride length.  TODAY'S TREATMENT:                                                                                                                              DATE:  02/24/23:  Nustep level 3 LE's only x 10 minutes f/b Patient in left SDLY position with folded pillow between  knees for comfort:  STW/M x 13 minutes to bilateral lumbar musculature f/b HMP and IFC at 80-150 Hz on 40% scan x 20 minutes.                                          EXERCISE LOG  Exercise Repetitions and Resistance Comments  Nustep Lvl 2 x 15 mins        Blank cell = exercise not performed today   Manual Therapy Soft Tissue Mobilization: Right lumbar, STW/M right lumbar paraspinals to decrease pain and tone with pt in left side-lying for comfort with pillow between her knees    Modalities  Date:  Unattended Estim: Lumbar, IFC 80-150 Hz, 15 mins, Pain Combo: Lumbar, 1.5 w/cm2 at 100%, 12 mins, Pain Hot Pack: Lumbar, 15 mins, Pain and Tone  PATIENT EDUCATION:   HOME EXERCISE PROGRAM:  ASSESSMENT:  CLINICAL IMPRESSION: Patient tolerated treatment well today but felt about the same after treatment.  I encouraged her to use a cane.  OBJECTIVE IMPAIRMENTS: Abnormal gait, decreased activity tolerance, decreased strength, increased muscle spasms, postural dysfunction, and pain.   ACTIVITY LIMITATIONS: carrying, lifting, bending, stairs, and locomotion level  PARTICIPATION LIMITATIONS: meal prep, cleaning, and laundry  PERSONAL FACTORS: Time since onset of injury/illness/exacerbation and 1 comorbidity: two prior lumbar surgeries  are also affecting patient's functional outcome.   REHAB POTENTIAL: Good  CLINICAL DECISION MAKING: Stable/uncomplicated  EVALUATION COMPLEXITY: Low   GOALS: Goals reviewed with patient? Yes  SHORT TERM GOALS: Target date: 01/31/13  Ind with an initial  HEP. Goal status: INITIAL   LONG TERM GOALS: Target date: 03/01/23  Ind with an advanced HEP.  Goal status: INITIAL  2.  Patient achieve active lumbar extension to 10 degrees.  Goal status: INITIAL  3.  Patient perform ADL's with pain not > 3-4/10.  Goal status: INITIAL  4.  5 time sit to stand performed in 15 seconds. Baseline:  Goal status: INITIAL  PLAN:  PT FREQUENCY: 2x/week  PT DURATION: 6 weeks  PLANNED INTERVENTIONS: Therapeutic exercises, Therapeutic activity, Neuromuscular re-education, Gait training, Patient/Family education, Self Care, Electrical stimulation, Cryotherapy, Moist heat, Ultrasound, and Manual therapy.  PLAN FOR NEXT SESSION: Combo e'stim/US at 1.50 W/CM2, STW/M, core exercise progression, spinal protection techniques and body mechanics training.    Arlethia Basso, [place], PT 02/24/2023, 4:03 PM

## 2023-03-08 ENCOUNTER — Ambulatory Visit: Payer: Medicare HMO

## 2023-03-08 DIAGNOSIS — R293 Abnormal posture: Secondary | ICD-10-CM

## 2023-03-08 DIAGNOSIS — M62830 Muscle spasm of back: Secondary | ICD-10-CM

## 2023-03-08 DIAGNOSIS — M5459 Other low back pain: Secondary | ICD-10-CM | POA: Diagnosis not present

## 2023-03-08 NOTE — Therapy (Signed)
OUTPATIENT PHYSICAL THERAPY THORACOLUMBAR TREATMENT   Patient Name: Wendy Arnold MRN: [id number] DOB:1950/07/04, 72 y.o., female Today's Date: 03/08/2023  END OF SESSION:  PT End of Session - 03/08/23 1434     Visit Number 5    Number of Visits 12    Date for PT Re-Evaluation 03/01/23    Authorization Type FOTO.    PT Start Time 1432    PT Stop Time 1530    PT Time Calculation (min) 58 min    Activity Tolerance Patient tolerated treatment well    Behavior During Therapy WFL for tasks assessed/performed             Past Medical History:  Diagnosis Date   Anxiety    Back pain    Depression    Diverticulitis    Hypertension    Insomnia    Migraines    Pneumonia    HX   PONV (postoperative nausea and vomiting)    Past Surgical History:  Procedure Laterality Date   ABDOMINAL HYSTERECTOMY     BACK SURGERY     LUMBAR LAMINECTOMY/DECOMPRESSION MICRODISCECTOMY Left 09/30/2022   Procedure: HEMI-LAMINECTOMY LUMBAR THREE-FOUR LEFT, REVISION HEMILAMINECTOMY LUMBAR FOUR-FIVE LEFT;  Surgeon: Jene Every, MD;  Location: [facility] OR;  Service: Orthopedics;  Laterality: Left;  3 C-BED   SINUS ENDO WITH FUSION     thumb surgery Left    TOE SURGERY Bilateral    both "pinky toes" bone shaved   Patient Active Problem List   Diagnosis Date Noted   Spinal stenosis at L4-L5 level 09/30/2022   Chronic midline low back pain with left-sided sciatica 09/24/2019   GAD (generalized anxiety disorder) 09/24/2019   Mixed hyperlipidemia 09/24/2019   Primary insomnia 06/25/2019   Essential hypertension 06/25/2019   Coronary artery disease due to lipid rich plaque 01/21/2016   Nicotine dependence, cigarettes, uncomplicated 01/21/2016   Chronic obstructive pulmonary disease (HCC) 02/24/2015   Alpha-1-antitrypsin deficiency carrier 07/24/2011    REFERRING PROVIDER: Jene Every MD  REFERRING DIAG: Vertebrogenic low back pain  Rationale for Evaluation and Treatment:  Rehabilitation  THERAPY DIAG:  Other low back pain  Abnormal posture  Muscle spasm of back  ONSET DATE: 1996.  SUBJECTIVE:                                                                                                                                                                                           SUBJECTIVE STATEMENT: Pt reports 8/10 low back pain and extreme weakness.   PERTINENT HISTORY:  Lumbar surgery (1996), hemilaminectomy (09/30/22).  PAIN:  Are you having pain? Yes: NPRS scale: 8/10 Pain location: LB  and into buttocks. Pain description: As above. Aggravating factors: As above. Relieving factors: As above.  PRECAUTIONS: Other: Please supervise gait.  She has two canes at home.  Recommend she walk with a cane.  RED FLAGS: None   WEIGHT BEARING RESTRICTIONS: No  FALLS:  Has patient fallen in last 6 months? Yes. Number of falls .  She states she recently went to sit in a chair and the legs slipped off porch and she fell onto her left side.  LIVING ENVIRONMENT: Lives in: House/apartment Stairs:  One step.  Non-reciprocating stair gait. Has following equipment at home: None.  Recommended she use a cane.  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  PLOF: Independent with basic ADLs  PATIENT GOALS: Decrease pain and be able to do more and care for brother.  OBJECTIVE:   PATIENT SURVEYS:  FOTO .  POSTURE: rounded shoulders, forward head, decreased lumbar lordosis, and flexed trunk   PALPATION: Tender and taut to palpation over patient's bilateral lumbar musculature.  LUMBAR ROM:   The patient stands in 20 degrees of trunk flexion and can achieve only -10 degrees from the neutral position.  Flexion is limited by 60%.  LOWER EXTREMITY MMT:    Bilateral knee and ankle strength is graded at 4 to 4+/5.  DTR's:    Bilateral Patellar reflexes are 2+/4+ and bilateral Achilles 1+/4+.  FUNCTIONAL TESTS:  5 times sit to stand: Performed in 22  seconds.  GAIT: Slow and purposeful gait pattern with patient walking in a flexed truck posture with decreased step and stride length.  TODAY'S TREATMENT:                                                                                                                              DATE:                                    EXERCISE LOG  Exercise Repetitions and Resistance Comments  Nustep Lvl 3 x 15 mins        Blank cell = exercise not performed today   Manual Therapy Soft Tissue Mobilization: Right lumbar, STW/M right lumbar paraspinals to decrease pain and tone with pt in left side-lying for comfort with pillow between her knees    Modalities  Date:  Unattended Estim: Lumbar, IFC 80-150 Hz, 15 mins, Pain Combo: Lumbar, 1.5 w/cm2 at 100%, 12 mins, Pain Hot Pack: Lumbar, 15 mins, Pain and Tone                                    PATIENT EDUCATION:   HOME EXERCISE PROGRAM:  ASSESSMENT:  CLINICAL IMPRESSION: Patient arrives for today's treatment session reporting 8/10 low back, right hip, and RLE pain.  Pt reports increased fatigue and weakness due to a recent virus.  Continued  to encourage patient to ambulate with her cane, which she does not have with her today. Normal responses to all modalities when completed.  Pt reported 5/10 low back pain at completion of today's treatment session.   OBJECTIVE IMPAIRMENTS: Abnormal gait, decreased activity tolerance, decreased strength, increased muscle spasms, postural dysfunction, and pain.   ACTIVITY LIMITATIONS: carrying, lifting, bending, stairs, and locomotion level  PARTICIPATION LIMITATIONS: meal prep, cleaning, and laundry  PERSONAL FACTORS: Time since onset of injury/illness/exacerbation and 1 comorbidity: two prior lumbar surgeries  are also affecting patient's functional outcome.   REHAB POTENTIAL: Good  CLINICAL DECISION MAKING: Stable/uncomplicated  EVALUATION COMPLEXITY: Low   GOALS: Goals reviewed with patient?  Yes  SHORT TERM GOALS: Target date: 01/31/13  Ind with an initial HEP. Goal status: INITIAL   LONG TERM GOALS: Target date: 03/01/23  Ind with an advanced HEP.  Goal status: INITIAL  2.  Patient achieve active lumbar extension to 10 degrees.  Goal status: INITIAL  3.  Patient perform ADL's with pain not > 3-4/10.  Goal status: INITIAL  4.  5 time sit to stand performed in 15 seconds. Baseline:  Goal status: INITIAL  PLAN:  PT FREQUENCY: 2x/week  PT DURATION: 6 weeks  PLANNED INTERVENTIONS: Therapeutic exercises, Therapeutic activity, Neuromuscular re-education, Gait training, Patient/Family education, Self Care, Electrical stimulation, Cryotherapy, Moist heat, Ultrasound, and Manual therapy.  PLAN FOR NEXT SESSION: Combo e'stim/US at 1.50 W/CM2, STW/M, core exercise progression, spinal protection techniques and body mechanics training.    Newman Pies, PTA 03/08/2023, 3:41 PM

## 2023-03-10 ENCOUNTER — Ambulatory Visit: Payer: Medicare HMO

## 2023-03-10 DIAGNOSIS — R293 Abnormal posture: Secondary | ICD-10-CM

## 2023-03-10 DIAGNOSIS — M5459 Other low back pain: Secondary | ICD-10-CM | POA: Diagnosis not present

## 2023-03-10 DIAGNOSIS — M62830 Muscle spasm of back: Secondary | ICD-10-CM

## 2023-03-10 NOTE — Therapy (Signed)
OUTPATIENT PHYSICAL THERAPY THORACOLUMBAR TREATMENT   Patient Name: Wendy Arnold MRN: [id number] DOB:1950/12/29, 72 y.o., female Today's Date: 03/10/2023  END OF SESSION:  PT End of Session - 03/10/23 1440     Visit Number 6    Number of Visits 12    Date for PT Re-Evaluation 03/01/23    Authorization Type FOTO.    PT Start Time 1439   Patient arrived late to her appointment.   PT Stop Time 1530    PT Time Calculation (min) 51 min    Activity Tolerance Patient tolerated treatment well    Behavior During Therapy WFL for tasks assessed/performed              Past Medical History:  Diagnosis Date   Anxiety    Back pain    Depression    Diverticulitis    Hypertension    Insomnia    Migraines    Pneumonia    HX   PONV (postoperative nausea and vomiting)    Past Surgical History:  Procedure Laterality Date   ABDOMINAL HYSTERECTOMY     BACK SURGERY     LUMBAR LAMINECTOMY/DECOMPRESSION MICRODISCECTOMY Left 09/30/2022   Procedure: HEMI-LAMINECTOMY LUMBAR THREE-FOUR LEFT, REVISION HEMILAMINECTOMY LUMBAR FOUR-FIVE LEFT;  Surgeon: Jene Every, MD;  Location: [facility] OR;  Service: Orthopedics;  Laterality: Left;  3 C-BED   SINUS ENDO WITH FUSION     thumb surgery Left    TOE SURGERY Bilateral    both "pinky toes" bone shaved   Patient Active Problem List   Diagnosis Date Noted   Spinal stenosis at L4-L5 level 09/30/2022   Chronic midline low back pain with left-sided sciatica 09/24/2019   GAD (generalized anxiety disorder) 09/24/2019   Mixed hyperlipidemia 09/24/2019   Primary insomnia 06/25/2019   Essential hypertension 06/25/2019   Coronary artery disease due to lipid rich plaque 01/21/2016   Nicotine dependence, cigarettes, uncomplicated 01/21/2016   Chronic obstructive pulmonary disease (HCC) 02/24/2015   Alpha-1-antitrypsin deficiency carrier 07/24/2011    REFERRING PROVIDER: Jene Every MD  REFERRING DIAG: Vertebrogenic low back pain  Rationale for  Evaluation and Treatment: Rehabilitation  THERAPY DIAG:  Other low back pain  Abnormal posture  Muscle spasm of back  ONSET DATE: 1996.  SUBJECTIVE:                                                                                                                                                                                           SUBJECTIVE STATEMENT: Patient reports that she is more sore than anything today.   PERTINENT HISTORY:  Lumbar surgery (1996), hemilaminectomy (09/30/22).  PAIN:  Are you  having pain? Yes: NPRS scale: 5/10 Pain location: LB and into buttocks. Pain description: As above. Aggravating factors: As above. Relieving factors: As above.  PRECAUTIONS: Other: Please supervise gait.  She has two canes at home.  Recommend she walk with a cane.  RED FLAGS: None   WEIGHT BEARING RESTRICTIONS: No  FALLS:  Has patient fallen in last 6 months? Yes. Number of falls .  She states she recently went to sit in a chair and the legs slipped off porch and she fell onto her left side.  LIVING ENVIRONMENT: Lives in: House/apartment Stairs:  One step.  Non-reciprocating stair gait. Has following equipment at home: None.  Recommended she use a cane.  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  PLOF: Independent with basic ADLs  PATIENT GOALS: Decrease pain and be able to do more and care for brother.  OBJECTIVE:   PATIENT SURVEYS:  FOTO .  POSTURE: rounded shoulders, forward head, decreased lumbar lordosis, and flexed trunk   PALPATION: Tender and taut to palpation over patient's bilateral lumbar musculature.  LUMBAR ROM:   The patient stands in 20 degrees of trunk flexion and can achieve only -10 degrees from the neutral position.  Flexion is limited by 60%.  LOWER EXTREMITY MMT:    Bilateral knee and ankle strength is graded at 4 to 4+/5.  DTR's:    Bilateral Patellar reflexes are 2+/4+ and bilateral Achilles 1+/4+.  FUNCTIONAL TESTS:  5 times sit to  stand: Performed in 22 seconds.  GAIT: Slow and purposeful gait pattern with patient walking in a flexed truck posture with decreased step and stride length.  TODAY'S TREATMENT:                                                                                                                              DATE:                                    03/10/23 EXERCISE LOG  Exercise Repetitions and Resistance Comments  Nustep  L3 x 15 minutes   LAQ 15 reps each   Seated marching  12 reps each   Alternating LE   Seated heel/toe raises  20 reps each         Blank cell = exercise not performed today  Modalities  Date:  Unattended Estim: Lumbar, IFC @ 80-150 Hz w/ 40% scan, 15 mins, Pain and Tone Hot Pack: Lumbar, 15 mins, Pain and Tone                                    EXERCISE LOG  Exercise Repetitions and Resistance Comments  Nustep Lvl 3 x 15 mins        Blank cell = exercise not performed today   Manual Therapy Soft Tissue Mobilization: Right lumbar,  STW/M right lumbar paraspinals to decrease pain and tone with pt in left side-lying for comfort with pillow between her knees    Modalities  Date:  Unattended Estim: Lumbar, IFC 80-150 Hz, 15 mins, Pain Combo: Lumbar, 1.5 w/cm2 at 100%, 12 mins, Pain Hot Pack: Lumbar, 15 mins, Pain and Tone                                    PATIENT EDUCATION:   HOME EXERCISE PROGRAM:  ASSESSMENT:  CLINICAL IMPRESSION: Patient was introduced to multiple new seated interventions with moderate difficulty. She required minimal cueing with pacing of today's interventions to avoid a significant increase in lumbar and lower extremity fatigue. She experienced a mild increase in low back discomfort with long arc quads, but this did not limit her ability to complete this intervention. She reported feeling "good" upon the conclusion of treatment. She continues to require skilled physical therapy to address her impairments to maximize her functional  mobility.  OBJECTIVE IMPAIRMENTS: Abnormal gait, decreased activity tolerance, decreased strength, increased muscle spasms, postural dysfunction, and pain.   ACTIVITY LIMITATIONS: carrying, lifting, bending, stairs, and locomotion level  PARTICIPATION LIMITATIONS: meal prep, cleaning, and laundry  PERSONAL FACTORS: Time since onset of injury/illness/exacerbation and 1 comorbidity: two prior lumbar surgeries  are also affecting patient's functional outcome.   REHAB POTENTIAL: Good  CLINICAL DECISION MAKING: Stable/uncomplicated  EVALUATION COMPLEXITY: Low   GOALS: Goals reviewed with patient? Yes  SHORT TERM GOALS: Target date: 01/31/13  Ind with an initial HEP. Goal status: INITIAL   LONG TERM GOALS: Target date: 03/01/23  Ind with an advanced HEP.  Goal status: INITIAL  2.  Patient achieve active lumbar extension to 10 degrees.  Goal status: INITIAL  3.  Patient perform ADL's with pain not > 3-4/10.  Goal status: INITIAL  4.  5 time sit to stand performed in 15 seconds. Baseline:  Goal status: INITIAL  PLAN:  PT FREQUENCY: 2x/week  PT DURATION: 6 weeks  PLANNED INTERVENTIONS: Therapeutic exercises, Therapeutic activity, Neuromuscular re-education, Gait training, Patient/Family education, Self Care, Electrical stimulation, Cryotherapy, Moist heat, Ultrasound, and Manual therapy.  PLAN FOR NEXT SESSION: Combo e'stim/US at 1.50 W/CM2, STW/M, core exercise progression, spinal protection techniques and body mechanics training.   Granville Lewis, PT 03/10/2023, 6:16 PM

## 2023-03-17 ENCOUNTER — Ambulatory Visit: Payer: Medicare HMO

## 2023-03-17 DIAGNOSIS — R293 Abnormal posture: Secondary | ICD-10-CM

## 2023-03-17 DIAGNOSIS — M5459 Other low back pain: Secondary | ICD-10-CM

## 2023-03-17 DIAGNOSIS — M62830 Muscle spasm of back: Secondary | ICD-10-CM

## 2023-03-17 NOTE — Therapy (Signed)
OUTPATIENT PHYSICAL THERAPY THORACOLUMBAR TREATMENT   Patient Name: Wendy Arnold MRN: [id number] DOB:Aug 12, 1950, 72 y.o., female Today's Date: 03/17/2023  END OF SESSION:  PT End of Session - 03/17/23 1434     Visit Number 7    Number of Visits 12    Date for PT Re-Evaluation 03/01/23    Authorization Type FOTO.    PT Start Time 1430    PT Stop Time 1531    PT Time Calculation (min) 61 min    Activity Tolerance Patient tolerated treatment well    Behavior During Therapy WFL for tasks assessed/performed              Past Medical History:  Diagnosis Date   Anxiety    Back pain    Depression    Diverticulitis    Hypertension    Insomnia    Migraines    Pneumonia    HX   PONV (postoperative nausea and vomiting)    Past Surgical History:  Procedure Laterality Date   ABDOMINAL HYSTERECTOMY     BACK SURGERY     LUMBAR LAMINECTOMY/DECOMPRESSION MICRODISCECTOMY Left 09/30/2022   Procedure: HEMI-LAMINECTOMY LUMBAR THREE-FOUR LEFT, REVISION HEMILAMINECTOMY LUMBAR FOUR-FIVE LEFT;  Surgeon: Jene Every, MD;  Location: [facility] OR;  Service: Orthopedics;  Laterality: Left;  3 C-BED   SINUS ENDO WITH FUSION     thumb surgery Left    TOE SURGERY Bilateral    both "pinky toes" bone shaved   Patient Active Problem List   Diagnosis Date Noted   Spinal stenosis at L4-L5 level 09/30/2022   Chronic midline low back pain with left-sided sciatica 09/24/2019   GAD (generalized anxiety disorder) 09/24/2019   Mixed hyperlipidemia 09/24/2019   Primary insomnia 06/25/2019   Essential hypertension 06/25/2019   Coronary artery disease due to lipid rich plaque 01/21/2016   Nicotine dependence, cigarettes, uncomplicated 01/21/2016   Chronic obstructive pulmonary disease (HCC) 02/24/2015   Alpha-1-antitrypsin deficiency carrier 07/24/2011    REFERRING PROVIDER: Jene Every MD  REFERRING DIAG: Vertebrogenic low back pain  Rationale for Evaluation and Treatment:  Rehabilitation  THERAPY DIAG:  Other low back pain  Abnormal posture  Muscle spasm of back  ONSET DATE: 1996.  SUBJECTIVE:                                                                                                                                                                                           SUBJECTIVE STATEMENT: Patient reports 6/10 low back pain.  Pt reports feeling ok after last treatment session.   PERTINENT HISTORY:  Lumbar surgery (1996), hemilaminectomy (09/30/22).  PAIN:  Are you having pain?  Yes: NPRS scale: 6/10 Pain location: LB and into buttocks. Pain description: As above. Aggravating factors: As above. Relieving factors: As above.  PRECAUTIONS: Other: Please supervise gait.  She has two canes at home.  Recommend she walk with a cane.  RED FLAGS: None   WEIGHT BEARING RESTRICTIONS: No  FALLS:  Has patient fallen in last 6 months? Yes. Number of falls .  She states she recently went to sit in a chair and the legs slipped off porch and she fell onto her left side.  LIVING ENVIRONMENT: Lives in: House/apartment Stairs:  One step.  Non-reciprocating stair gait. Has following equipment at home: None.  Recommended she use a cane.  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  PLOF: Independent with basic ADLs  PATIENT GOALS: Decrease pain and be able to do more and care for brother.  OBJECTIVE:   PATIENT SURVEYS:  FOTO .  POSTURE: rounded shoulders, forward head, decreased lumbar lordosis, and flexed trunk   PALPATION: Tender and taut to palpation over patient's bilateral lumbar musculature.  LUMBAR ROM:   The patient stands in 20 degrees of trunk flexion and can achieve only -10 degrees from the neutral position.  Flexion is limited by 60%.  LOWER EXTREMITY MMT:    Bilateral knee and ankle strength is graded at 4 to 4+/5.  DTR's:    Bilateral Patellar reflexes are 2+/4+ and bilateral Achilles 1+/4+.  FUNCTIONAL TESTS:  5 times sit to  stand: Performed in 22 seconds.  GAIT: Slow and purposeful gait pattern with patient walking in a flexed truck posture with decreased step and stride length.  TODAY'S TREATMENT:                                                                                                                              DATE:                                    03/17/23 EXERCISE LOG  Exercise Repetitions and Resistance Comments  Nustep  L3 x 15 minutes   LAQ 15 reps each   Seated marching  15 reps each   Alternating LE   Seated heel/toe raises  20 reps each         Blank cell = exercise not performed today   Manual Therapy Soft Tissue Mobilization: right lumbar , STW/M to right lumbar spine to decrease pain and tone with pt in left side-lying for comfort     Modalities  Date:  Unattended Estim: Lumbar, IFC @ 80-150 Hz w/ 40% scan, 15 mins, Pain and Tone Combo: Lumbar, 100%, 1.5 w/cm2, 10 mins, Pain Hot Pack: Lumbar, 15 mins, Pain and Tone                                    EXERCISE LOG  Exercise Repetitions and Resistance Comments  Nustep Lvl 3 x 15 mins        Blank cell = exercise not performed today   Manual Therapy Soft Tissue Mobilization: Right lumbar, STW/M right lumbar paraspinals to decrease pain and tone with pt in left side-lying for comfort with pillow between her knees    Modalities  Date:  Unattended Estim: Lumbar, IFC 80-150 Hz, 15 mins, Pain Combo: Lumbar, 1.5 w/cm2 at 100%, 12 mins, Pain Hot Pack: Lumbar, 15 mins, Pain and Tone                                    PATIENT EDUCATION:   HOME EXERCISE PROGRAM:  ASSESSMENT:  CLINICAL IMPRESSION: Pt arrives for today's treatment session reporting 6/10 right low back pain.  Pt able to tolerate increased reps with seated exercises today. STW/M to right upper glute and QL to decrease pain and tone.  Normal responses to all modalities performed today.  Pt reported decreased pain at completion of today's treatment session.    OBJECTIVE IMPAIRMENTS: Abnormal gait, decreased activity tolerance, decreased strength, increased muscle spasms, postural dysfunction, and pain.   ACTIVITY LIMITATIONS: carrying, lifting, bending, stairs, and locomotion level  PARTICIPATION LIMITATIONS: meal prep, cleaning, and laundry  PERSONAL FACTORS: Time since onset of injury/illness/exacerbation and 1 comorbidity: two prior lumbar surgeries  are also affecting patient's functional outcome.   REHAB POTENTIAL: Good  CLINICAL DECISION MAKING: Stable/uncomplicated  EVALUATION COMPLEXITY: Low   GOALS: Goals reviewed with patient? Yes  SHORT TERM GOALS: Target date: 01/31/13  Ind with an initial HEP. Goal status: INITIAL   LONG TERM GOALS: Target date: 03/01/23  Ind with an advanced HEP.  Goal status: INITIAL  2.  Patient achieve active lumbar extension to 10 degrees.  Goal status: INITIAL  3.  Patient perform ADL's with pain not > 3-4/10.  Goal status: INITIAL  4.  5 time sit to stand performed in 15 seconds. Baseline:  Goal status: INITIAL  PLAN:  PT FREQUENCY: 2x/week  PT DURATION: 6 weeks  PLANNED INTERVENTIONS: Therapeutic exercises, Therapeutic activity, Neuromuscular re-education, Gait training, Patient/Family education, Self Care, Electrical stimulation, Cryotherapy, Moist heat, Ultrasound, and Manual therapy.  PLAN FOR NEXT SESSION: Combo e'stim/US at 1.50 W/CM2, STW/M, core exercise progression, spinal protection techniques and body mechanics training.   Newman Pies, PTA 03/17/2023, 5:40 PM

## 2023-03-22 ENCOUNTER — Ambulatory Visit: Payer: Medicare HMO

## 2023-03-22 DIAGNOSIS — M5459 Other low back pain: Secondary | ICD-10-CM

## 2023-03-22 DIAGNOSIS — M62830 Muscle spasm of back: Secondary | ICD-10-CM

## 2023-03-22 DIAGNOSIS — R293 Abnormal posture: Secondary | ICD-10-CM

## 2023-03-22 NOTE — Therapy (Signed)
OUTPATIENT PHYSICAL THERAPY THORACOLUMBAR TREATMENT   Patient Name: Wendy Arnold MRN: [id number] DOB:May 13, 1951, 72 y.o., female Today's Date: 03/22/2023  END OF SESSION:  PT End of Session - 03/22/23 1434     Visit Number 8    Number of Visits 12    Date for PT Re-Evaluation 03/01/23    Authorization Type FOTO.    PT Start Time 1430    PT Stop Time 1532    PT Time Calculation (min) 62 min    Activity Tolerance Patient tolerated treatment well    Behavior During Therapy WFL for tasks assessed/performed              Past Medical History:  Diagnosis Date   Anxiety    Back pain    Depression    Diverticulitis    Hypertension    Insomnia    Migraines    Pneumonia    HX   PONV (postoperative nausea and vomiting)    Past Surgical History:  Procedure Laterality Date   ABDOMINAL HYSTERECTOMY     BACK SURGERY     LUMBAR LAMINECTOMY/DECOMPRESSION MICRODISCECTOMY Left 09/30/2022   Procedure: HEMI-LAMINECTOMY LUMBAR THREE-FOUR LEFT, REVISION HEMILAMINECTOMY LUMBAR FOUR-FIVE LEFT;  Surgeon: Jene Every, MD;  Location: [facility] OR;  Service: Orthopedics;  Laterality: Left;  3 C-BED   SINUS ENDO WITH FUSION     thumb surgery Left    TOE SURGERY Bilateral    both "pinky toes" bone shaved   Patient Active Problem List   Diagnosis Date Noted   Spinal stenosis at L4-L5 level 09/30/2022   Chronic midline low back pain with left-sided sciatica 09/24/2019   GAD (generalized anxiety disorder) 09/24/2019   Mixed hyperlipidemia 09/24/2019   Primary insomnia 06/25/2019   Essential hypertension 06/25/2019   Coronary artery disease due to lipid rich plaque 01/21/2016   Nicotine dependence, cigarettes, uncomplicated 01/21/2016   Chronic obstructive pulmonary disease (HCC) 02/24/2015   Alpha-1-antitrypsin deficiency carrier 07/24/2011    REFERRING PROVIDER: Jene Every MD  REFERRING DIAG: Vertebrogenic low back pain  Rationale for Evaluation and Treatment:  Rehabilitation  THERAPY DIAG:  Other low back pain  Abnormal posture  Muscle spasm of back  ONSET DATE: 1996.  SUBJECTIVE:                                                                                                                                                                                           SUBJECTIVE STATEMENT: Patient reports 8/10 right hip and low back pain today.   PERTINENT HISTORY:  Lumbar surgery (1996), hemilaminectomy (09/30/22).  PAIN:  Are you having pain? Yes: NPRS scale: 8/10 Pain  location: LB and into buttocks. Pain description: As above. Aggravating factors: As above. Relieving factors: As above.  PRECAUTIONS: Other: Please supervise gait.  She has two canes at home.  Recommend she walk with a cane.  RED FLAGS: None   WEIGHT BEARING RESTRICTIONS: No  FALLS:  Has patient fallen in last 6 months? Yes. Number of falls .  She states she recently went to sit in a chair and the legs slipped off porch and she fell onto her left side.  LIVING ENVIRONMENT: Lives in: House/apartment Stairs:  One step.  Non-reciprocating stair gait. Has following equipment at home: None.  Recommended she use a cane.  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  PLOF: Independent with basic ADLs  PATIENT GOALS: Decrease pain and be able to do more and care for brother.  OBJECTIVE:   PATIENT SURVEYS:  FOTO .  POSTURE: rounded shoulders, forward head, decreased lumbar lordosis, and flexed trunk   PALPATION: Tender and taut to palpation over patient's bilateral lumbar musculature.  LUMBAR ROM:   The patient stands in 20 degrees of trunk flexion and can achieve only -10 degrees from the neutral position.  Flexion is limited by 60%.  LOWER EXTREMITY MMT:    Bilateral knee and ankle strength is graded at 4 to 4+/5.  DTR's:    Bilateral Patellar reflexes are 2+/4+ and bilateral Achilles 1+/4+.  FUNCTIONAL TESTS:  5 times sit to stand: Performed in 22  seconds.  GAIT: Slow and purposeful gait pattern with patient walking in a flexed truck posture with decreased step and stride length.  TODAY'S TREATMENT:                                                                                                                              DATE:                                    03/22/23 EXERCISE LOG  Exercise Repetitions and Resistance Comments  Nustep  L3 x 15 minutes   LAQ 20 reps each   Seated marching  20 reps each   Alternating LE   Seated heel/toe raises  20 reps each         Blank cell = exercise not performed today   Manual Therapy Soft Tissue Mobilization: right lumbar , STW/M to right lumbar spine to decrease pain and tone with pt in left side-lying for comfort     Modalities  Date:  Unattended Estim: Lumbar, IFC @ 80-150 Hz w/ 40% scan, 15 mins, Pain and Tone Combo: Lumbar, 100%, 1.5 w/cm2, 10 mins, Pain Hot Pack: Lumbar, 15 mins, Pain and Tone                                    EXERCISE LOG  Exercise Repetitions and Resistance  Comments  Nustep Lvl 3 x 15 mins        Blank cell = exercise not performed today   Manual Therapy Soft Tissue Mobilization: Right lumbar, STW/M right lumbar paraspinals to decrease pain and tone with pt in left side-lying for comfort with pillow between her knees    Modalities  Date:  Unattended Estim: Lumbar, IFC 80-150 Hz, 15 mins, Pain Combo: Lumbar, 1.5 w/cm2 at 100%, 12 mins, Pain Hot Pack: Lumbar, 15 mins, Pain and Tone                                    PATIENT EDUCATION:   HOME EXERCISE PROGRAM:  ASSESSMENT:  CLINICAL IMPRESSION: Pt arrives for today's treatment session reporting 8/10 right low back pain.  Pt able to demonstrate approximately 12 degrees of lumbar extension today, meeting her LTG, but pt demonstrates instability during testing.  Pt able to perform 5 STS test in 18.1 seconds making good progress towards her goal at this time.  Normal responses to all modalities  noted upon removal/completion.  Pt reported minimal decrease in pain at completion of today's treatment session.   Pt plans to discharge at next treatment session.  OBJECTIVE IMPAIRMENTS: Abnormal gait, decreased activity tolerance, decreased strength, increased muscle spasms, postural dysfunction, and pain.   ACTIVITY LIMITATIONS: carrying, lifting, bending, stairs, and locomotion level  PARTICIPATION LIMITATIONS: meal prep, cleaning, and laundry  PERSONAL FACTORS: Time since onset of injury/illness/exacerbation and 1 comorbidity: two prior lumbar surgeries  are also affecting patient's functional outcome.   REHAB POTENTIAL: Good  CLINICAL DECISION MAKING: Stable/uncomplicated  EVALUATION COMPLEXITY: Low   GOALS: Goals reviewed with patient? Yes  SHORT TERM GOALS: Target date: 01/31/13  Ind with an initial HEP. Goal status: MET   LONG TERM GOALS: Target date: 03/01/23  Ind with an advanced HEP.  Goal status: IN PROGRESS  2.  Patient achieve active lumbar extension to 10 degrees.   10/29: approx. 12 degrees Goal status: MET  3.  Patient perform ADL's with pain not > 3-4/10.  Goal status: IN PROGRESS  4.  5 time sit to stand performed in 15 seconds. Baseline: 10/29: 18 seconds Goal status: IN PROGRESS  PLAN:  PT FREQUENCY: 2x/week  PT DURATION: 6 weeks  PLANNED INTERVENTIONS: Therapeutic exercises, Therapeutic activity, Neuromuscular re-education, Gait training, Patient/Family education, Self Care, Electrical stimulation, Cryotherapy, Moist heat, Ultrasound, and Manual therapy.  PLAN FOR NEXT SESSION: Combo e'stim/US at 1.50 W/CM2, STW/M, core exercise progression, spinal protection techniques and body mechanics training.   Newman Pies, PTA 03/22/2023, 3:43 PM

## 2023-03-24 ENCOUNTER — Ambulatory Visit: Payer: Medicare HMO

## 2023-04-04 ENCOUNTER — Emergency Department (HOSPITAL_BASED_OUTPATIENT_CLINIC_OR_DEPARTMENT_OTHER): Payer: Medicare HMO

## 2023-04-04 ENCOUNTER — Emergency Department (HOSPITAL_BASED_OUTPATIENT_CLINIC_OR_DEPARTMENT_OTHER)
Admission: EM | Admit: 2023-04-04 | Discharge: 2023-04-04 | Discharge status: Against Medical Advice | Payer: Medicare HMO | Attending: Emergency Medicine | Admitting: Emergency Medicine

## 2023-04-04 ENCOUNTER — Encounter (HOSPITAL_BASED_OUTPATIENT_CLINIC_OR_DEPARTMENT_OTHER): Payer: Self-pay

## 2023-04-04 ENCOUNTER — Other Ambulatory Visit: Payer: Self-pay

## 2023-04-04 DIAGNOSIS — I10 Essential (primary) hypertension: Secondary | ICD-10-CM | POA: Insufficient documentation

## 2023-04-04 DIAGNOSIS — J449 Chronic obstructive pulmonary disease, unspecified: Secondary | ICD-10-CM | POA: Diagnosis not present

## 2023-04-04 DIAGNOSIS — Z1152 Encounter for screening for COVID-19: Secondary | ICD-10-CM | POA: Diagnosis not present

## 2023-04-04 DIAGNOSIS — I2510 Atherosclerotic heart disease of native coronary artery without angina pectoris: Secondary | ICD-10-CM | POA: Diagnosis not present

## 2023-04-04 DIAGNOSIS — R531 Weakness: Secondary | ICD-10-CM | POA: Diagnosis present

## 2023-04-04 DIAGNOSIS — G5632 Lesion of radial nerve, left upper limb: Secondary | ICD-10-CM | POA: Insufficient documentation

## 2023-04-04 DIAGNOSIS — Z79899 Other long term (current) drug therapy: Secondary | ICD-10-CM | POA: Insufficient documentation

## 2023-04-04 LAB — URINALYSIS, ROUTINE W REFLEX MICROSCOPIC
Bacteria, UA: NONE SEEN
Bilirubin Urine: NEGATIVE
Glucose, UA: NEGATIVE mg/dL
Hgb urine dipstick: NEGATIVE
Ketones, ur: NEGATIVE mg/dL
Nitrite: NEGATIVE
Protein, ur: NEGATIVE mg/dL
Specific Gravity, Urine: 1.018 (ref 1.005–1.030)
pH: 6 (ref 5.0–8.0)

## 2023-04-04 LAB — RESP PANEL BY RT-PCR (RSV, FLU A&B, COVID)  RVPGX2
Influenza A by PCR: NEGATIVE
Influenza B by PCR: NEGATIVE
Resp Syncytial Virus by PCR: NEGATIVE
SARS Coronavirus 2 by RT PCR: NEGATIVE

## 2023-04-04 LAB — CBC
HCT: 41.1 % (ref 36.0–46.0)
Hemoglobin: 12.9 g/dL (ref 12.0–15.0)
MCH: 30.6 pg (ref 26.0–34.0)
MCHC: 31.4 g/dL (ref 30.0–36.0)
MCV: 97.4 fL (ref 80.0–100.0)
Platelets: 383 10*3/uL (ref 150–400)
RBC: 4.22 MIL/uL (ref 3.87–5.11)
RDW: 14.6 % (ref 11.5–15.5)
WBC: 7.3 10*3/uL (ref 4.0–10.5)
nRBC: 0 % (ref 0.0–0.2)

## 2023-04-04 LAB — BASIC METABOLIC PANEL
Anion gap: 6 (ref 5–15)
BUN: 14 mg/dL (ref 8–23)
CO2: 31 mmol/L (ref 22–32)
Calcium: 9.2 mg/dL (ref 8.9–10.3)
Chloride: 100 mmol/L (ref 98–111)
Creatinine, Ser: 1.05 mg/dL — ABNORMAL HIGH (ref 0.44–1.00)
GFR, Estimated: 57 mL/min — ABNORMAL LOW (ref >60)
Glucose, Bld: 87 mg/dL (ref 70–99)
Potassium: 4.4 mmol/L (ref 3.5–5.1)
Sodium: 137 mmol/L (ref 135–145)

## 2023-04-04 LAB — CBG MONITORING, ED: Glucose-Capillary: 92 mg/dL (ref 70–99)

## 2023-04-04 NOTE — ED Triage Notes (Addendum)
Pt arrives ambulatory to ED, states 'I think I had a mini stroke'. She states that she took a nap yesterday and when she woke up noticed that she couldn't move her left hand from wrist down and had some weakened grip. Pt states she went to sleep around 2 pm. And woke up with symptoms around 4:30 pm. Denies any other symptoms. Pt appears to be moving hand some when speaking in triage. Pt also grabbed hand rail to scale when standing on it in triage.

## 2023-04-04 NOTE — ED Provider Notes (Signed)
[place] EMERGENC[facility] DEPARTMENT AT [facility] Provider Note   CSN: [id number] Arrival date & time: 04/04/23  1337     History  Chief Complaint  Patient presents with   Weakness    Wendy Arnold is a 72 y.o. female.  Patient complains of numbness and weakness to her left wrist and hand.  Patient reports that she took a nap yesterday between 2 and 4:00.  Patient reports when she woke up she had decreased use of her wrist and her hand.  Patient reports today she became concerned that she possibly could have had a stroke.  Patient states she does not know if she slept on her arm or if her arm was in an awkward position.  Patient denies any other area of weakness.  She denies any facial weakness she denies any difficulty with speech hearing or vision.  Patient has a past medical history of hypertension coronary artery disease and COPD.  The history is provided by the patient. No language interpreter was used.  Weakness      Home Medications Prior to Admission medications   Medication Sig Start Date End Date Taking? Authorizing Provider  albuterol (VENTOLIN HFA) 108 (90 Base) MCG/ACT inhaler Inhale 1-2 puffs into the lungs every 6 (six) hours as needed for wheezing or shortness of breath.    [provider]  ALPRAZolam Prudy Feeler) 0.5 MG tablet Take 1 tablet (0.5 mg total) by mouth 2 (two) times daily as needed for anxiety. Patient taking differently: Take 0.5 mg by mouth 3 (three) times daily as needed for anxiety. 07/27/21   Daphine Deutscher, Mary-Margaret, FNP  amLODipine (NORVASC) 10 MG tablet Take 10 mg by mouth daily.    [provider]  Carboxymethylcellul-Glycerin (LUBRICATING EYE DROPS OP) Place 1 drop into both eyes daily as needed (dry eyes). Patient not taking: Reported on 01/18/2023    [provider]  cyclobenzaprine (FLEXERIL) 10 MG tablet Take 1 tablet (10 mg total) by mouth 2 (two) times daily as needed for muscle spasms. 03/27/21   Lovorn, Aundra Millet,  MD  docusate sodium (COLACE) 100 MG capsule Take 1 capsule (100 mg total) by mouth 2 (two) times daily as needed for mild constipation. Patient not taking: Reported on 01/18/2023 10/01/22   Dorothy Spark, PA-C  ezetimibe (ZETIA) 10 MG tablet Take 1 tablet (10 mg total) by mouth daily. 03/31/21   Daphine Deutscher, Mary-Margaret, FNP  FLUoxetine (PROZAC) 40 MG capsule Take 40 mg by mouth daily.    [provider]  gabapentin (NEURONTIN) 300 MG capsule TAKE 1 CAPSULE THREE TIMES DAILY 07/27/21   Daphine Deutscher, Mary-Margaret, FNP  naloxone [facility]) nasal spray 4 mg/0.1 mL Place 1 spray into the nose as needed (opioid overdose).    [provider]  PERCOCET 10-325 MG tablet Take 1 tablet by mouth 4 (four) times daily as needed for pain. 01/22/22   [provider]  polyethylene glycol (MIRALAX / GLYCOLAX) 17 g packet Take 17 g by mouth daily. Patient not taking: Reported on 01/18/2023 10/01/22   Dorothy Spark, PA-C  traZODone (DESYREL) 150 MG tablet TAKE 1 TABLET AT BEDTIME. MAY TAKE 150MG  AS PRESCRIBED 11/30/21   Bennie Pierini, FNP      Allergies    Flagyl [metronidazole] and Amoxicillin-pot clavulanate    Review of Systems   Review of Systems  Neurological:  Positive for weakness.  All other systems reviewed and are negative.   Physical Exam Updated Vital Signs BP (!) 144/78   Pulse 81  Temp 97.7 F (36.5 C)   Resp 14   Ht 5\' 7"  (1.702 m)   Wt 72.5 kg   SpO2 90%   BMI 25.03 kg/m  Physical Exam Vitals and nursing note reviewed.  Constitutional:      Appearance: She is well-developed.  HENT:     Head: Normocephalic.     Nose: Nose normal.     Mouth/Throat:     Mouth: Mucous membranes are moist.  Eyes:     Extraocular Movements: Extraocular movements intact.     Pupils: Pupils are equal, round, and reactive to light.  Cardiovascular:     Rate and Rhythm: Normal rate and regular rhythm.  Pulmonary:     Effort: Pulmonary effort is normal.  Abdominal:      General: There is no distension.  Musculoskeletal:        General: Normal range of motion.     Cervical back: Normal range of motion.  Skin:    General: Skin is warm.  Neurological:     General: No focal deficit present.     Mental Status: She is alert and oriented to person, place, and time.     Cranial Nerves: No cranial nerve deficit.  Psychiatric:        Mood and Affect: Mood normal.     ED Results / Procedures / Treatments   Labs (all labs ordered are listed, but only abnormal results are displayed) Labs Reviewed  BASIC METABOLIC PANEL - Abnormal; Notable for the following components:      Result Value   Creatinine, Ser 1.05 (*)    GFR, Estimated 57 (*)    All other components within normal limits  URINALYSIS, ROUTINE W REFLEX MICROSCOPIC - Abnormal; Notable for the following components:   Leukocytes,Ua SMALL (*)    All other components within normal limits  RESP PANEL BY RT-PCR (RSV, FLU A&B, COVID)  RVPGX2  CBC  CBG MONITORING, ED    EKG None  Radiology CT Head Wo Contrast  Result Date: 04/04/2023 CLINICAL DATA:  Neuro deficit, acute, stroke suspected. Left upper extremity weakness beginning yesterday. EXAM: CT HEAD WITHOUT CONTRAST TECHNIQUE: Contiguous axial images were obtained from the base of the skull through the vertex without intravenous contrast. RADIATION DOSE REDUCTION: This exam was performed according to the departmental dose-optimization program which includes automated exposure control, adjustment of the mA and/or kV according to patient size and/or use of iterative reconstruction technique. COMPARISON:  Head CT 11/07/2004 FINDINGS: Brain: There is no evidence of an acute infarct, intracranial hemorrhage, mass, midline shift, or extra-axial fluid collection. Cerebral white matter hypodensities are new and nonspecific but compatible with mild chronic small vessel ischemic disease. Mild cerebral atrophy is within normal limits for age. Vascular: Calcified  atherosclerosis at the skull base. No hyperdense vessel. Skull: No acute fracture. Nonspecific 1 cm lucent focus in the left parietal skull. Sinuses/Orbits: Partial imaging of chronic right-sided sinusitis with evidence of prior surgery. Clear mastoid air cells. No acute finding in the included portions of the orbits. Other: None. IMPRESSION: 1. No evidence of acute intracranial abnormality. 2. Mild chronic small vessel ischemic disease. Electronically Signed   By: Sebastian Ache M.D.   On: 04/04/2023 20:05    Procedures Procedures    Medications Ordered in ED Medications - No data to display  ED Course/ Medical Decision Making/ A&P  Medical Decision Making Pt complains of left wrist and hand weakness.  Pt noticed yesterday at 4pm after awaking from a nap  Amount and/or Complexity of Data Reviewed Labs: ordered. Decision-making details documented in ED Course.    Details: Labs ordered reviewed and interpreted  Radiology: ordered and independent interpretation performed. Decision-making details documented in ED Course.    Details: Ct head,  no acute abnormality  Risk Risk Details: Dr. Andria Meuse in to see and examine pt.  Symptoms most likely from radial nerve compression.   Pt decided not to wait for ct results.  I spoke with pt  she reports she has to go home. Pt states she will follow up with her primary care MD. Pt states she cares for her brother and has to be home.  Pt left ama I reviewed ct scan  no acute findings.             Final Clinical Impression(s) / ED Diagnoses Final diagnoses:  Radial nerve compression, left    Rx / DC Orders ED Discharge Orders     None      An After Visit Summary was printed and given to the patient.    Elson Areas, Cordelia Poche 04/04/23 2206    Anders Simmonds T, DO 04/05/23 2246

## 2023-04-04 NOTE — ED Notes (Signed)
Josh PA aware of patients symptoms states that he will see her once in a room.

## 2023-04-04 NOTE — ED Notes (Signed)
Pt called out stating she has a mentally challenged family member at home, whose caregiver leaves at 7pm. States he cannot be home alone and she has to leave. PA notified and spoke with patient. Advised patient of risks of leaving against medical advice and pt signed AMA form.  Pt ambulatory, CA&Ox4, and in NAD at time of discharge.

## 2023-04-13 NOTE — Progress Notes (Signed)
In response to coding query, viral panel ordered to see if the patient had one of the viruses that we test for.

## 2023-06-25 DEATH — deceased
